# Patient Record
Sex: Female | Born: 1950
Health system: Southern US, Community
[De-identification: ages and names within clinical notes are randomized; demographics above are authoritative.]

## PROBLEM LIST (undated history)

## (undated) DIAGNOSIS — L409 Psoriasis, unspecified: Secondary | ICD-10-CM

## (undated) DIAGNOSIS — N201 Calculus of ureter: Secondary | ICD-10-CM

## (undated) DIAGNOSIS — N200 Calculus of kidney: Secondary | ICD-10-CM

## (undated) DIAGNOSIS — G4730 Sleep apnea, unspecified: Secondary | ICD-10-CM

## (undated) DIAGNOSIS — M1990 Unspecified osteoarthritis, unspecified site: Secondary | ICD-10-CM

## (undated) HISTORY — PX: TONSILLECTOMY: SUR1361

---

## 1992-08-27 HISTORY — PX: ABDOMINAL HYSTERECTOMY: SHX81

## 1999-02-03 ENCOUNTER — Other Ambulatory Visit: Admission: RE | Admit: 1999-02-03 | Discharge: 1999-02-03 | Payer: Self-pay | Admitting: Family Medicine

## 2001-02-13 ENCOUNTER — Other Ambulatory Visit: Admission: RE | Admit: 2001-02-13 | Discharge: 2001-02-13 | Payer: Self-pay | Admitting: Family Medicine

## 2003-02-05 ENCOUNTER — Other Ambulatory Visit: Admission: RE | Admit: 2003-02-05 | Discharge: 2003-02-05 | Payer: Self-pay | Admitting: Family Medicine

## 2012-12-07 ENCOUNTER — Emergency Department: Payer: Self-pay | Admitting: Emergency Medicine

## 2012-12-07 LAB — COMPREHENSIVE METABOLIC PANEL
Albumin: 3.6 g/dL (ref 3.4–5.0)
Alkaline Phosphatase: 113 U/L (ref 50–136)
Bilirubin,Total: 0.3 mg/dL (ref 0.2–1.0)
EGFR (African American): >60
EGFR (Non-African Amer.): >60
Glucose: 122 mg/dL — ABNORMAL HIGH (ref 65–99)
Osmolality: 282 (ref 275–301)
SGPT (ALT): 19 U/L (ref 12–78)
Sodium: 140 mmol/L (ref 136–145)
Total Protein: 7.5 g/dL (ref 6.4–8.2)

## 2012-12-07 LAB — URINALYSIS, COMPLETE
Nitrite: NEGATIVE
Ph: 5 (ref 4.5–8.0)
Protein: NEGATIVE
RBC,UR: 8 /HPF (ref 0–5)
Specific Gravity: 1.027 (ref 1.003–1.030)
WBC UR: 3 /HPF (ref 0–5)

## 2012-12-07 LAB — CBC
HCT: 39.4 % (ref 35.0–47.0)
MCH: 31.3 pg (ref 26.0–34.0)
MCHC: 33 g/dL (ref 32.0–36.0)
RDW: 14.2 % (ref 11.5–14.5)

## 2012-12-15 ENCOUNTER — Ambulatory Visit: Payer: Self-pay

## 2012-12-16 ENCOUNTER — Other Ambulatory Visit: Payer: Self-pay | Admitting: Urology

## 2012-12-23 ENCOUNTER — Encounter (HOSPITAL_BASED_OUTPATIENT_CLINIC_OR_DEPARTMENT_OTHER): Payer: Self-pay | Admitting: *Deleted

## 2012-12-24 ENCOUNTER — Encounter (HOSPITAL_BASED_OUTPATIENT_CLINIC_OR_DEPARTMENT_OTHER): Payer: Self-pay | Admitting: *Deleted

## 2012-12-24 NOTE — Progress Notes (Addendum)
NPO AFTER MN WITH EXCEPTION WATER UNTIL 0600.  ARRIVES AT 1115. NEEDS ISTAT 8 (GENT. ORDERED). MAY TAKE RX PAIN/ NAUSEA/ MEDS IF NEEDED W/ SIPS OF WATER.

## 2012-12-25 ENCOUNTER — Encounter (HOSPITAL_BASED_OUTPATIENT_CLINIC_OR_DEPARTMENT_OTHER): Payer: Self-pay | Admitting: *Deleted

## 2012-12-25 ENCOUNTER — Encounter (HOSPITAL_BASED_OUTPATIENT_CLINIC_OR_DEPARTMENT_OTHER): Payer: Self-pay | Admitting: Anesthesiology

## 2012-12-25 ENCOUNTER — Encounter (HOSPITAL_BASED_OUTPATIENT_CLINIC_OR_DEPARTMENT_OTHER)
Admission: RE | Disposition: A | Discharge status: Home / Self Care | Payer: Self-pay | Source: Ambulatory Visit | Attending: Urology

## 2012-12-25 ENCOUNTER — Ambulatory Visit (HOSPITAL_BASED_OUTPATIENT_CLINIC_OR_DEPARTMENT_OTHER): Payer: BC Managed Care – PPO | Admitting: Anesthesiology

## 2012-12-25 ENCOUNTER — Ambulatory Visit (HOSPITAL_BASED_OUTPATIENT_CLINIC_OR_DEPARTMENT_OTHER)
Admission: RE | Admit: 2012-12-25 | Discharge: 2012-12-25 | Disposition: A | Discharge status: Home / Self Care | Payer: BC Managed Care – PPO | Source: Ambulatory Visit | Attending: Urology | Admitting: Urology

## 2012-12-25 DIAGNOSIS — Z888 Allergy status to other drugs, medicaments and biological substances status: Secondary | ICD-10-CM | POA: Insufficient documentation

## 2012-12-25 DIAGNOSIS — E669 Obesity, unspecified: Secondary | ICD-10-CM | POA: Insufficient documentation

## 2012-12-25 DIAGNOSIS — M129 Arthropathy, unspecified: Secondary | ICD-10-CM | POA: Insufficient documentation

## 2012-12-25 DIAGNOSIS — N201 Calculus of ureter: Secondary | ICD-10-CM | POA: Insufficient documentation

## 2012-12-25 DIAGNOSIS — N200 Calculus of kidney: Secondary | ICD-10-CM | POA: Insufficient documentation

## 2012-12-25 DIAGNOSIS — L408 Other psoriasis: Secondary | ICD-10-CM | POA: Insufficient documentation

## 2012-12-25 HISTORY — PX: CYSTOSCOPY WITH RETROGRADE PYELOGRAM, URETEROSCOPY AND STENT PLACEMENT: SHX5789

## 2012-12-25 HISTORY — DX: Calculus of kidney: N20.0

## 2012-12-25 HISTORY — DX: Calculus of ureter: N20.1

## 2012-12-25 HISTORY — PX: HOLMIUM LASER APPLICATION: SHX5852

## 2012-12-25 HISTORY — DX: Psoriasis, unspecified: L40.9

## 2012-12-25 HISTORY — DX: Unspecified osteoarthritis, unspecified site: M19.90

## 2012-12-25 LAB — POCT I-STAT, CHEM 8
Calcium, Ion: 1.25 mmol/L (ref 1.13–1.30)
Glucose, Bld: 83 mg/dL (ref 70–99)
HCT: 39 % (ref 36.0–46.0)
TCO2: 27 mmol/L (ref 0–100)

## 2012-12-25 SURGERY — CYSTOURETEROSCOPY, WITH RETROGRADE PYELOGRAM AND STENT INSERTION
Anesthesia: General | Site: Ureter | Laterality: Left | Wound class: Clean Contaminated

## 2012-12-25 MED ORDER — LIDOCAINE HCL (CARDIAC) 20 MG/ML IV SOLN
INTRAVENOUS | Status: DC | PRN
  Administered 2012-12-25: 75 mg via INTRAVENOUS

## 2012-12-25 MED ORDER — LACTATED RINGERS IV SOLN
INTRAVENOUS | Status: DC
  Administered 2012-12-25 (×2): via INTRAVENOUS
  Filled 2012-12-25: qty 1000

## 2012-12-25 MED ORDER — FENTANYL CITRATE 0.05 MG/ML IJ SOLN
INTRAMUSCULAR | Status: DC | PRN
  Administered 2012-12-25 (×3): 25 ug via INTRAVENOUS
  Administered 2012-12-25: 50 ug via INTRAVENOUS
  Administered 2012-12-25: 25 ug via INTRAVENOUS

## 2012-12-25 MED ORDER — DEXAMETHASONE SODIUM PHOSPHATE 4 MG/ML IJ SOLN
INTRAMUSCULAR | Status: DC | PRN
  Administered 2012-12-25: 8 mg via INTRAVENOUS

## 2012-12-25 MED ORDER — SODIUM CHLORIDE 0.9 % IR SOLN
Status: DC | PRN
  Administered 2012-12-25: 6000 mL via INTRAVESICAL

## 2012-12-25 MED ORDER — PROPOFOL 10 MG/ML IV BOLUS
INTRAVENOUS | Status: DC | PRN
  Administered 2012-12-25: 300 mg via INTRAVENOUS

## 2012-12-25 MED ORDER — GENTAMICIN IN SALINE 1.6-0.9 MG/ML-% IV SOLN
80.0000 mg | INTRAVENOUS | Status: AC
  Administered 2012-12-25: 560 mg via INTRAVENOUS
  Filled 2012-12-25: qty 50

## 2012-12-25 MED ORDER — PROMETHAZINE HCL 25 MG/ML IJ SOLN
6.2500 mg | INTRAMUSCULAR | Status: DC | PRN
  Filled 2012-12-25: qty 1

## 2012-12-25 MED ORDER — IOHEXOL 350 MG/ML SOLN
INTRAVENOUS | Status: DC | PRN
  Administered 2012-12-25: 15 mL via URETHRAL

## 2012-12-25 MED ORDER — FENTANYL CITRATE 0.05 MG/ML IJ SOLN
25.0000 ug | INTRAMUSCULAR | Status: DC | PRN
  Filled 2012-12-25: qty 1

## 2012-12-25 SURGICAL SUPPLY — 40 items
ADAPTER CATH URET PLST 4-6FR (CATHETERS) IMPLANT
BAG DRAIN URO-CYSTO SKYTR STRL (DRAIN) ×2 IMPLANT
BAG URO CATCHER STRL LF (DRAPE) ×2 IMPLANT
BASKET LASER NITINOL 1.9FR (BASKET) ×2 IMPLANT
BASKET STNLS GEMINI 4WIRE 3FR (BASKET) IMPLANT
BASKET ZERO TIP NITINOL 2.4FR (BASKET) IMPLANT
BRUSH URET BIOPSY 3F (UROLOGICAL SUPPLIES) IMPLANT
CANISTER SUCT LVC 12 LTR MEDI- (MISCELLANEOUS) ×2 IMPLANT
CATH FOLEY 2WAY  3CC  8FR (CATHETERS)
CATH FOLEY 2WAY 3CC 8FR (CATHETERS) IMPLANT
CATH INTERMIT  6FR 70CM (CATHETERS) IMPLANT
CATH URET 5FR 28IN CONE TIP (BALLOONS)
CATH URET 5FR 28IN OPEN ENDED (CATHETERS) ×2 IMPLANT
CATH URET 5FR 70CM CONE TIP (BALLOONS) IMPLANT
CLOTH BEACON ORANGE TIMEOUT ST (SAFETY) ×2 IMPLANT
DRAPE CAMERA CLOSED 9X96 (DRAPES) ×2 IMPLANT
ELECT REM PT RETURN 9FT ADLT (ELECTROSURGICAL)
ELECTRODE REM PT RTRN 9FT ADLT (ELECTROSURGICAL) IMPLANT
GLOVE BIO SURGEON STRL SZ7 (GLOVE) ×2 IMPLANT
GLOVE BIO SURGEON STRL SZ7.5 (GLOVE) ×2 IMPLANT
GLOVE INDICATOR 7.0 STRL GRN (GLOVE) ×2 IMPLANT
GOWN PREVENTION PLUS LG XLONG (DISPOSABLE) ×4 IMPLANT
GOWN STRL REIN XL XLG (GOWN DISPOSABLE) ×2 IMPLANT
GUIDEWIRE 0.038 PTFE COATED (WIRE) IMPLANT
GUIDEWIRE ANG ZIPWIRE 038X150 (WIRE) ×2 IMPLANT
GUIDEWIRE STR DUAL SENSOR (WIRE) ×2 IMPLANT
IV NS IRRIG 3000ML ARTHROMATIC (IV SOLUTION) ×4 IMPLANT
KIT BALLIN UROMAX 15FX10 (LABEL) IMPLANT
KIT BALLN UROMAX 15FX4 (MISCELLANEOUS) IMPLANT
KIT BALLN UROMAX 26 75X4 (MISCELLANEOUS)
LASER FIBER DISP (UROLOGICAL SUPPLIES) ×2 IMPLANT
PACK CYSTOSCOPY (CUSTOM PROCEDURE TRAY) ×2 IMPLANT
SET HIGH PRES BAL DIL (LABEL)
SHEATH URET ACCESS 12FR/35CM (UROLOGICAL SUPPLIES) IMPLANT
SHEATH URET ACCESS 12FR/55CM (UROLOGICAL SUPPLIES) IMPLANT
STENT CONTOUR 6FRX24X.038 (STENTS) ×2 IMPLANT
STENT CONTOUR 6FRX26X.035 (STENTS) ×2 IMPLANT
SYRINGE 10CC LL (SYRINGE) ×2 IMPLANT
SYRINGE IRR TOOMEY STRL 70CC (SYRINGE) IMPLANT
TUBE FEEDING 8FR 16IN STR KANG (MISCELLANEOUS) IMPLANT

## 2012-12-25 NOTE — Anesthesia Procedure Notes (Signed)
Procedure Name: LMA Insertion Date/Time: 12/25/2012 12:46 PM Performed by: Fran Lowes Pre-anesthesia Checklist: Patient identified, Emergency Drugs available, Suction available and Patient being monitored Patient Re-evaluated:Patient Re-evaluated prior to inductionOxygen Delivery Method: Circle System Utilized Preoxygenation: Pre-oxygenation with 100% oxygen Intubation Type: IV induction Ventilation: Mask ventilation without difficulty LMA: LMA inserted LMA Size: 4.0 Number of attempts: 1 Airway Equipment and Method: bite block Placement Confirmation: positive ETCO2 Tube secured with: Tape Dental Injury: Teeth and Oropharynx as per pre-operative assessment

## 2012-12-25 NOTE — Brief Op Note (Signed)
12/25/2012  1:27 PM  PATIENT:  Cornelia Copa  62 y.o. female  PRE-OPERATIVE DIAGNOSIS:  LEFT RENAL AND URETERAL STONES  POST-OPERATIVE DIAGNOSIS:  LEFT RENAL AND URETERAL STONES  PROCEDURE:  Procedure(s) with comments: CYSTOSCOPY WITH RETROGRADE PYELOGRAM, URETEROSCOPY AND STENT PLACEMENT (Left) - 90 MIN NEEDS DIG URETEROSCOPE  HOLMIUM LASER APPLICATION (Left)  SURGEON:  Surgeon(s) and Role:    * Sebastian Ache, MD - Primary  PHYSICIAN ASSISTANT:   ASSISTANTS: none   ANESTHESIA:   general  EBL:  Total I/O In: 200 [I.V.:200] Out: -   BLOOD ADMINISTERED:none  DRAINS: none   LOCAL MEDICATIONS USED:  NONE  SPECIMEN:  Source of Specimen:  Left Ureteral Stone  DISPOSITION OF SPECIMEN:  [facility] for Compositional Analysis  COUNTS:  YES  TOURNIQUET:  * No tourniquets in log *  DICTATION: .Other Dictation: Dictation Number [id number]  PLAN OF CARE: Discharge to home after PACU  PATIENT DISPOSITION:  PACU - hemodynamically stable.   Delay start of Pharmacological VTE agent (>24hrs) due to surgical blood loss or risk of bleeding: no

## 2012-12-25 NOTE — Anesthesia Preprocedure Evaluation (Signed)
Anesthesia Evaluation  Patient identified by MRN, date of birth, ID band Patient awake  General Assessment Comment:  Left ureteral calculus     .  Renal calculus, left     .  Psoriasis  SEVERE--  RECEIVES LASE TX'S TWICE WEEKLY   .  Arthritis     Reviewed: Allergy & Precautions, H&P , NPO status , Patient's Chart, lab work & pertinent test results  Airway Mallampati: II TM Distance: >3 FB Neck ROM: Full    Dental no notable dental hx.    Pulmonary neg pulmonary ROS,  breath sounds clear to auscultation  Pulmonary exam normal       Cardiovascular negative cardio ROS  Rhythm:Regular Rate:Normal     Neuro/Psych negative neurological ROS  negative psych ROS   GI/Hepatic negative GI ROS, Neg liver ROS,   Endo/Other  negative endocrine ROSMorbid obesity  Renal/GU Renal disease  negative genitourinary   Musculoskeletal negative musculoskeletal ROS (+)   Abdominal (+) + obese,   Peds negative pediatric ROS (+)  Hematology negative hematology ROS (+)   Anesthesia Other Findings   Reproductive/Obstetrics negative OB ROS                           Anesthesia Physical Anesthesia Plan  ASA: III  Anesthesia Plan: General   Post-op Pain Management:    Induction: Intravenous  Airway Management Planned: LMA  Additional Equipment:   Intra-op Plan:   Post-operative Plan: Extubation in OR  Informed Consent: I have reviewed the patients History and Physical, chart, labs and discussed the procedure including the risks, benefits and alternatives for the proposed anesthesia with the patient or authorized representative who has indicated his/her understanding and acceptance.   Dental advisory given  Plan Discussed with: CRNA  Anesthesia Plan Comments:         Anesthesia Quick Evaluation

## 2012-12-25 NOTE — Transfer of Care (Signed)
Immediate Anesthesia Transfer of Care Note  Patient: Morgan Mcclain  Procedure(s) Performed: Procedure(s) (LRB): CYSTOSCOPY WITH RETROGRADE PYELOGRAM, URETEROSCOPY AND STENT PLACEMENT (Left) HOLMIUM LASER APPLICATION (Left)  Patient Location: Patient transported to PACU with oxygen via face mask at 4 Liters / Min  Anesthesia Type: General  Level of Consciousness: awake and alert   Airway & Oxygen Therapy: Patient Spontanous Breathing and Patient connected to face mask oxygen  Post-op Assessment: Report given to PACU RN and Post -op Vital signs reviewed and stable  Post vital signs: Reviewed and stable  Dentition: Teeth and oropharynx remain in pre-op condition  Complications: No apparent anesthesia complications

## 2012-12-25 NOTE — H&P (Signed)
Morgan Mcclain is an 62 y.o. female.    Chief Complaint: Pre-OP Left Ureteroscopic Stone Manipulation  HPI:   1 - Left Ureteral Stone  - Pt with Left 6mm UVJ sonte and ipsilateral 2mm renal stone found on CT at [facility] ER 11/2012 on w/u colickly flank pain. NO fevers. No prior stones. No interval passage.  PMH sig for benign hyst, obesity, psorriasis. NO CV disease. No blood thinners.   Past Medical History  Diagnosis Date  . Left ureteral calculus   . Renal calculus, left   . Psoriasis SEVERE--  RECEIVES LASE TX'S TWICE WEEKLY  . Arthritis     Past Surgical History  Procedure Laterality Date  . Abdominal hysterectomy  1994    W/ UNILATERAL SALPINGOOPHORECTOMY  . Tonsillectomy  AGE 71    History reviewed. No pertinent family history. Social History:  reports that she has never smoked. She has never used smokeless tobacco. She reports that she does not drink alcohol or use illicit drugs.  Allergies:  Allergies  Allergen Reactions  . Lamisil (Terbinafine Hcl) Other (See Comments)    LOSS SENSE OF TASTE    No prescriptions prior to admission    No results found for this or any previous visit (from the past 48 hour(s)). No results found.  Review of Systems  Constitutional: Negative.  Negative for fever and chills.  HENT: Negative.   Eyes: Negative.   Respiratory: Negative.   Cardiovascular: Negative.   Gastrointestinal: Negative.   Genitourinary: Positive for flank pain. Negative for dysuria and urgency.  Musculoskeletal: Negative.   Skin: Negative.   Neurological: Negative.   Endo/Heme/Allergies: Negative.   Psychiatric/Behavioral: Negative.     Height 5\' 5"  (1.651 m), weight 111.131 kg (245 lb). Physical Exam  Constitutional: She is oriented to person, place, and time. She appears well-developed and well-nourished.  HENT:  Head: Normocephalic and atraumatic.  Eyes: EOM are normal. Pupils are equal, round, and reactive to light.  Neck: Normal range  of motion. Neck supple.  Cardiovascular: Normal rate.   Respiratory: Effort normal.  GI: Soft. Bowel sounds are normal.  Genitourinary:  Moderate Left CVAT  Musculoskeletal: Normal range of motion.  Neurological: She is alert and oriented to person, place, and time.  Skin: Skin is warm and dry.  Psychiatric: She has a normal mood and affect. Her behavior is normal. Judgment and thought content normal.     Assessment/Plan 1 - Left Ureteral Stone  - We discussed ureteroscopic stone manipulation with basketing and laser-lithotripsy in detail.  We discussed risks including bleeding, infection, damage to kidney / ureter  bladder, rarely loss of kidney. We discussed anesthetic risks and rare but serious surgical complications including DVT, PE, MI, and mortality. We specifically addressed that in 5-10% of cases a staged approach is required with stenting followed by re-attempt ureteroscopy if anatomy unfavorable. The patient voiced understanding and wises to proceed.   Cadell Gabrielson 12/25/2012, 9:42 AM

## 2012-12-26 ENCOUNTER — Encounter (HOSPITAL_BASED_OUTPATIENT_CLINIC_OR_DEPARTMENT_OTHER): Payer: Self-pay | Admitting: Urology

## 2012-12-26 NOTE — Anesthesia Postprocedure Evaluation (Signed)
  Anesthesia Post-op Note  Patient: Morgan Mcclain  Procedure(s) Performed: Procedure(s) (LRB): CYSTOSCOPY WITH RETROGRADE PYELOGRAM, URETEROSCOPY AND STENT PLACEMENT (Left) HOLMIUM LASER APPLICATION (Left)  Patient Location: PACU  Anesthesia Type: General  Level of Consciousness: awake and alert   Airway and Oxygen Therapy: Patient Spontanous Breathing  Post-op Pain: mild  Post-op Assessment: Post-op Vital signs reviewed, Patient's Cardiovascular Status Stable, Respiratory Function Stable, Patent Airway and No signs of Nausea or vomiting  Last Vitals:  Filed Vitals:   12/25/12 1600  BP: 136/82  Pulse: 68  Temp: 36.1 C  Resp: 18    Post-op Vital Signs: stable   Complications: No apparent anesthesia complications

## 2012-12-26 NOTE — Op Note (Signed)
NAMETERRION, Morgan Mcclain NO.:  [id number]  MEDICAL RECORD NO.:  [id number]  LOCATION:                                 FACILITY:  PHYSICIAN:  Sebastian Ache, MD          DATE OF BIRTH:  DATE OF PROCEDURE:  12/25/2012 DATE OF DISCHARGE:                              OPERATIVE REPORT   DIAGNOSIS:  Left ureteral stone, flank pain.  PROCEDURES: 1. Left ureteroscopic laser lithotripsy with removal of ureteral     stone. 2. Left retrograde pyelogram with interpretation. 3. Left ureteral stent placement, 6 x 26, with tether to the left     side.  COMPLICATIONS:  None.  SPECIMEN:  Left ureteral stone for compositional analysis.  ESTIMATED BLOOD LOSS:  Nil.  DRAINS:  None.  INDICATION:  Morgan Mcclain is a pleasant 62 year old lady with recent onset of left flank pain worrisome for possible ureteral stone.  She underwent evaluation in the [facility] Emergency Room where she was found to have a distal left ureteral stone versus phlebolith.  I saw the patient, reviewed the films and felt this was highly concerning for distal left ureteral stone.  Options were discussed including observation versus medical therapy versus shockwave lithotripsy versus ureteroscopic stone manipulation and she wished to proceed with the latter.  Informed consent was obtained and placed in the medical record.  PROCEDURE IN DETAIL:  The patient was being Morgan Mcclain, was verified.  Procedure being left ureteroscopic stone manipulation was confirmed.  Procedure was carried out.  Time-out was performed. Intravenous antibiotics were administered.  General LMA anesthesia was introduced.  The patient was placed into a low lithotomy position and sterile field was created by prepping and draping the patient's vagina, introitus, and proximal thighs using iodine x3.  Next, cystourethroscopy was performed using 22-French rigid cystoscope with 12-degree offset lens.  Inspection of the urinary  bladder revealed no diverticula, calcifications, papular lesions.  Ureteral orifices were in the normal anatomic position.  The left ureteral orifice was gently cannulated using a 5-French end-hole catheter and left retrograde pyelogram was seen.  Left retrograde pyelogram demonstrated a single left ureter, single system left kidney.  There was single filling defect in the distal ureter consistent with known stone.  There was no hydroureteronephrosis. A 0.038 Glidewire was advanced at the level of the upper pole and set aside as a safety wire.  Next, semi-rigid ureteroscopy was performed of the distal two-thirds of the ureter alongside a separate Sensor working wire and 8-French feeding tube in the urinary bladder for pressure release using normal saline irrigation under pressure.  This corroborated distal left ureteral stone, suspected no abnormalities in the distal two-thirds of the ureter other than the stone.  The stone appeared to be too large for simple basketing as such holmium laser energy was applied to the stone using settings of 0.5 joules and 5 hertz fragmenting into three smaller pieces.  These were then grasped with an escape-type basket and brought out in their entirety and set aside for compositional analysis.  Repeat ureteroscopy of the distal two-thirds revealed no mucosal abnormalities, no residual stone fragments.  There was a question of a  concomitant punctate left renal stone.  It was felt that inspection of the intrarenal collecting system was warranted as such the semi-rigid ureteroscope was exchanged over the sensor working wire for a 6-French flexible ureteroscope to the level of the kidney under continuous fluoroscopy, panendoscopy.  The left kidney revealed no mucosal abnormalities and no evidence of perforation or urolithiasis. The entire left ureter was once again inspected with withdrawing of the scope and no mucosal abnormalities were found.  Finally, a  new 6 x 24 double-J stent was carefully navigated up to the remaining safety wire and this appeared to be too short as such it was exchanged for a 6 x 26 stent on the left side, which allowed excellent proximal and distal curl.  Efflux of the urine was noted around and through the distal end of the stent, tether was left in place and fashioned to the thigh. Bladder was emptied per cystoscope.  Procedure was then terminated.  The patient tolerated the procedure well.  There were no immediate periprocedural complications.  The patient was taken to postanesthesia care unit in stable condition.          ______________________________ Sebastian Ache, MD     TM/MEDQ  D:  12/25/2012  T:  12/26/2012  Job:  [id number]

## 2013-08-24 ENCOUNTER — Ambulatory Visit: Payer: Self-pay | Admitting: Family Medicine

## 2013-08-26 ENCOUNTER — Ambulatory Visit: Payer: Self-pay | Admitting: Family Medicine

## 2015-01-07 ENCOUNTER — Ambulatory Visit: Payer: BLUE CROSS/BLUE SHIELD | Attending: Otolaryngology

## 2015-01-07 DIAGNOSIS — E669 Obesity, unspecified: Secondary | ICD-10-CM | POA: Diagnosis not present

## 2015-01-07 DIAGNOSIS — G4733 Obstructive sleep apnea (adult) (pediatric): Secondary | ICD-10-CM | POA: Diagnosis present

## 2015-01-07 DIAGNOSIS — R0683 Snoring: Secondary | ICD-10-CM | POA: Diagnosis not present

## 2015-01-11 ENCOUNTER — Other Ambulatory Visit: Payer: Self-pay | Admitting: Family Medicine

## 2015-01-11 DIAGNOSIS — Z1231 Encounter for screening mammogram for malignant neoplasm of breast: Secondary | ICD-10-CM

## 2015-01-21 ENCOUNTER — Ambulatory Visit
Admission: RE | Admit: 2015-01-21 | Discharge: 2015-01-21 | Disposition: A | Discharge status: Home / Self Care | Payer: BLUE CROSS/BLUE SHIELD | Source: Ambulatory Visit | Attending: Family Medicine | Admitting: Family Medicine

## 2015-01-21 DIAGNOSIS — Z1231 Encounter for screening mammogram for malignant neoplasm of breast: Secondary | ICD-10-CM

## 2015-02-03 ENCOUNTER — Telehealth: Payer: Self-pay

## 2015-02-03 DIAGNOSIS — Z1211 Encounter for screening for malignant neoplasm of colon: Secondary | ICD-10-CM

## 2015-02-03 NOTE — Telephone Encounter (Signed)
-----   Message from Myrtis Hopping sent at 01/31/2015 11:19 AM EDT ----- Regarding: triage From: [place] Planas Sent: 01/13/2015  please contact patient for colonoscopy screening

## 2015-02-03 NOTE — Telephone Encounter (Signed)
LVM for pt to return my call.

## 2015-02-09 ENCOUNTER — Other Ambulatory Visit: Payer: Self-pay

## 2015-02-09 ENCOUNTER — Telehealth: Payer: Self-pay

## 2015-02-09 NOTE — Telephone Encounter (Signed)
Pt scheduled for colonoscopy at [facility] on July 8th. Instructs/rx mailed.

## 2015-02-09 NOTE — Telephone Encounter (Signed)
Gastroenterology Pre-Procedure Review  Request Date: 03-04-15 Requesting Physician: Dr. Burnell Blanks  PATIENT REVIEW QUESTIONS: The patient responded to the following health history questions as indicated:    1. Are you having any GI issues? no 2. Do you have a personal history of Polyps? no 3. Do you have a family history of Colon Cancer or Polyps? no 4. Diabetes Mellitus? no 5. Joint replacements in the past 12 months?no 6. Major health problems in the past 3 months?no 7. Any artificial heart valves, MVP, or defibrillator?no    MEDICATIONS & ALLERGIES:    Patient reports the following regarding taking any anticoagulation/antiplatelet therapy:   Plavix, Coumadin, Eliquis, Xarelto, Lovenox, Pradaxa, Brilinta, or Effient? no Aspirin? no  Patient confirms/reports the following medications:  Current Outpatient Prescriptions  Medication Sig Dispense Refill  . Apremilast (OTEZLA) 10 & 20 & 30 MG TBPK Take 20 mg by mouth 2 (two) times daily.     No current facility-administered medications for this visit.    Patient confirms/reports the following allergies:  Allergies  Allergen Reactions  . Lamisil [Terbinafine Hcl] Other (See Comments)    LOSS SENSE OF TASTE    No orders of the defined types were placed in this encounter.    AUTHORIZATION INFORMATION Primary Insurance: 1D#: Group #:  Secondary Insurance: 1D#: Group #:  SCHEDULE INFORMATION: Date: 03-04-15 Time: Location: [facility]

## 2015-02-09 NOTE — Telephone Encounter (Signed)
-----   Message from Samar R Ellison sent at 01/31/2015 11:19 AM EDT ----- Regarding: triage From: Alexander, Ami Sent: 01/13/2015  please contact patient for colonoscopy screening 

## 2015-02-23 ENCOUNTER — Encounter: Payer: Self-pay | Admitting: *Deleted

## 2015-02-23 DIAGNOSIS — G4730 Sleep apnea, unspecified: Secondary | ICD-10-CM | POA: Insufficient documentation

## 2015-02-23 HISTORY — DX: Sleep apnea, unspecified: G47.30

## 2015-03-03 NOTE — Discharge Instructions (Signed)

## 2015-03-04 ENCOUNTER — Ambulatory Visit: Payer: BC Managed Care – PPO | Admitting: Anesthesiology

## 2015-03-04 ENCOUNTER — Ambulatory Visit
Admission: RE | Admit: 2015-03-04 | Discharge: 2015-03-04 | Disposition: A | Discharge status: Home / Self Care | Payer: BC Managed Care – PPO | Source: Ambulatory Visit | Attending: Gastroenterology | Admitting: Gastroenterology

## 2015-03-04 ENCOUNTER — Encounter
Admission: RE | Disposition: A | Discharge status: Home / Self Care | Payer: Self-pay | Source: Ambulatory Visit | Attending: Gastroenterology

## 2015-03-04 DIAGNOSIS — Z87442 Personal history of urinary calculi: Secondary | ICD-10-CM | POA: Diagnosis not present

## 2015-03-04 DIAGNOSIS — M1990 Unspecified osteoarthritis, unspecified site: Secondary | ICD-10-CM | POA: Insufficient documentation

## 2015-03-04 DIAGNOSIS — Z888 Allergy status to other drugs, medicaments and biological substances status: Secondary | ICD-10-CM | POA: Insufficient documentation

## 2015-03-04 DIAGNOSIS — L409 Psoriasis, unspecified: Secondary | ICD-10-CM | POA: Diagnosis not present

## 2015-03-04 DIAGNOSIS — Z9889 Other specified postprocedural states: Secondary | ICD-10-CM | POA: Diagnosis not present

## 2015-03-04 DIAGNOSIS — G4730 Sleep apnea, unspecified: Secondary | ICD-10-CM | POA: Diagnosis not present

## 2015-03-04 DIAGNOSIS — Z79899 Other long term (current) drug therapy: Secondary | ICD-10-CM | POA: Insufficient documentation

## 2015-03-04 DIAGNOSIS — Z1211 Encounter for screening for malignant neoplasm of colon: Secondary | ICD-10-CM | POA: Insufficient documentation

## 2015-03-04 DIAGNOSIS — Z90710 Acquired absence of both cervix and uterus: Secondary | ICD-10-CM | POA: Insufficient documentation

## 2015-03-04 HISTORY — DX: Sleep apnea, unspecified: G47.30

## 2015-03-04 HISTORY — PX: COLONOSCOPY WITH PROPOFOL: SHX5780

## 2015-03-04 SURGERY — COLONOSCOPY WITH PROPOFOL
Anesthesia: Monitor Anesthesia Care | Wound class: Contaminated

## 2015-03-04 MED ORDER — LIDOCAINE HCL (CARDIAC) 20 MG/ML IV SOLN
INTRAVENOUS | Status: DC | PRN
  Administered 2015-03-04: 50 mg via INTRAVENOUS

## 2015-03-04 MED ORDER — LACTATED RINGERS IV SOLN
INTRAVENOUS | Status: DC
  Administered 2015-03-04: 10:00:00 via INTRAVENOUS

## 2015-03-04 MED ORDER — ACETAMINOPHEN 160 MG/5ML PO SOLN: 325.0000 mg | ORAL | Status: DC | PRN

## 2015-03-04 MED ORDER — SODIUM CHLORIDE 0.9 % IV SOLN: INTRAVENOUS | Status: DC

## 2015-03-04 MED ORDER — STERILE WATER FOR IRRIGATION IR SOLN
Status: DC | PRN
  Administered 2015-03-04: 11:00:00

## 2015-03-04 MED ORDER — ACETAMINOPHEN 325 MG PO TABS: 325.0000 mg | ORAL_TABLET | ORAL | Status: DC | PRN

## 2015-03-04 MED ORDER — PROPOFOL 10 MG/ML IV BOLUS
INTRAVENOUS | Status: DC | PRN
  Administered 2015-03-04: 50 mg via INTRAVENOUS
  Administered 2015-03-04 (×3): 20 mg via INTRAVENOUS
  Administered 2015-03-04: 10 mg via INTRAVENOUS
  Administered 2015-03-04: 30 mg via INTRAVENOUS
  Administered 2015-03-04: 20 mg via INTRAVENOUS

## 2015-03-04 SURGICAL SUPPLY — 28 items

## 2015-03-04 NOTE — Op Note (Signed)
[facility] Gastroenterology Patient Name: Morgan Mcclain Procedure Date: 03/04/2015 10:35 AM MRN: [id number] Account #: [id number] Date of Birth: 03-09-1951 Admit Type: Outpatient Age: [age] [facility] Gender: Female Note Status: Finalized Procedure:         Colonoscopy Indications:       Screening for colorectal malignant neoplasm Providers:         Midge Miniumarren Carolie Mcilrath, MD Referring MD:      Durward FortesMaura L. Hamrick, MD (Referring MD) Medicines:         Propofol per Anesthesia Complications:     No immediate complications. Procedure:         Pre-Anesthesia Assessment:                    - Prior to the procedure, a History and Physical was                     performed, and patient medications and allergies were                     reviewed. The patient's tolerance of previous anesthesia                     was also reviewed. The risks and benefits of the procedure                     and the sedation options and risks were discussed with the                     patient. All questions were answered, and informed consent                     was obtained. Prior Anticoagulants: The patient has taken                     no previous anticoagulant or antiplatelet agents. ASA                     Grade Assessment: II - A patient with mild systemic                     disease. After reviewing the risks and benefits, the                     patient was deemed in satisfactory condition to undergo                     the procedure.                    After obtaining informed consent, the colonoscope was                     passed under direct vision. Throughout the procedure, the                     patient's blood pressure, pulse, and oxygen saturations                     were monitored continuously. The was introduced through                     the anus and advanced to the the cecum, identified by  appendiceal orifice and ileocecal valve. The colonoscopy                 was performed without difficulty. The patient tolerated                     the procedure well. The quality of the bowel preparation                     was excellent. Findings:      The perianal and digital rectal examinations were normal.      The colon (entire examined portion) appeared normal. Impression:        - The entire examined colon is normal.                    - No specimens collected. Recommendation:    - Repeat colonoscopy in 10 years for screening unless any                     change in family history or lower GI problems. Procedure Code(s): --- Professional ---                    [phone], Colonoscopy, flexible; diagnostic, including                     collection of specimen(s) by brushing or washing, when                     performed (separate procedure) Diagnosis Code(s): --- Professional ---                    Z12.11, Encounter for screening for malignant neoplasm of                     colon CPT copyright 2014 American Medical Association. All rights reserved. The codes documented in this report are preliminary and upon coder review may  be revised to meet current compliance requirements. Midge Minium, MD 03/04/2015 11:05:15 AM This report has been signed electronically. Number of Addenda: 0 Note Initiated On: 03/04/2015 10:35 AM Scope Withdrawal Time: 0 hours 6 minutes 34 seconds  Total Procedure Duration: 0 hours 10 minutes 34 seconds       [facility]

## 2015-03-04 NOTE — H&P (Signed)
  [facility]  [address] [place], [place] [address] Phone: [phone] Fax : [phone]  Primary Care Physician:  Ailene RavelHAMRICK,MAURA L, MD Primary Gastroenterologist:  Dr. Servando SnareWohl  Pre-Procedure History & Physical: HPI:  Morgan Copaatricia D Kishimoto is a 64 y.o. female is here for a screening colonoscopy.   Past Medical History  Diagnosis Date  . Left ureteral calculus   . Renal calculus, left   . Psoriasis SEVERE--  RECEIVES LASE TX'S TWICE WEEKLY  . Arthritis   . Sleep apnea 02/23/2015    got CPAP machine today    Past Surgical History  Procedure Laterality Date  . Abdominal hysterectomy  1994    W/ UNILATERAL SALPINGOOPHORECTOMY  . Tonsillectomy  AGE 64  . Cystoscopy with retrograde pyelogram, ureteroscopy and stent placement Left 12/25/2012    Procedure: CYSTOSCOPY WITH RETROGRADE PYELOGRAM, URETEROSCOPY AND STENT PLACEMENT;  Surgeon: Sebastian Acheheodore Manny, MD;  [facility];  Service: Urology;  Laterality: Left;  90 MIN NEEDS DIG URETEROSCOPE   . Holmium laser application Left 12/25/2012    Procedure: HOLMIUM LASER APPLICATION;  Surgeon: Sebastian Acheheodore Manny, MD;  [facility];  Service: Urology;  Laterality: Left;    Prior to Admission medications   Medication Sig Start Date End Date Taking? Authorizing Provider  Apremilast (OTEZLA) 10 & 20 & 30 MG TBPK Take 20 mg by mouth 2 (two) times daily.   Yes Historical Provider, MD    Allergies as of 02/09/2015 - Review Complete 12/25/2012  Allergen Reaction Noted  . Lamisil [terbinafine hcl] Other (See Comments) 12/24/2012    History reviewed. No pertinent family history.  History   Social History  . Marital Status: Married    Spouse Name: N/A  . Number of Children: N/A  . Years of Education: N/A   Occupational History  . Not on file.   Social History Main Topics  . Smoking status: Never Smoker   . Smokeless tobacco: Never Used  . Alcohol Use: No  . Drug Use: No  .  Sexual Activity: Not on file   Other Topics Concern  . Not on file   Social History Narrative    Review of Systems: See HPI, otherwise negative ROS  Physical Exam: BP 130/70 mmHg  Pulse 50  Temp(Src) 97.7 F (36.5 C) (Temporal)  Resp 16  Ht 5\' 5"  (1.651 m)  Wt 229 lb (103.874 kg)  BMI 38.11 kg/m2  SpO2 100% General:   Alert,  pleasant and cooperative in NAD Head:  Normocephalic and atraumatic. Neck:  Supple; no masses or thyromegaly. Lungs:  Clear throughout to auscultation.    Heart:  Regular rate and rhythm. Abdomen:  Soft, nontender and nondistended. Normal bowel sounds, without guarding, and without rebound.   Neurologic:  Alert and  oriented x4;  grossly normal neurologically.  Impression/Plan: Morgan Mcclain is now here to undergo a screening colonoscopy.  Risks, benefits, and alternatives regarding colonoscopy have been reviewed with the patient.  Questions have been answered.  All parties agreeable.

## 2015-03-04 NOTE — Anesthesia Procedure Notes (Signed)
Procedure Name: MAC Performed by: Baylin Gamblin Pre-anesthesia Checklist: Patient identified, Emergency Drugs available, Suction available, Timeout performed and Patient being monitored Patient Re-evaluated:Patient Re-evaluated prior to inductionOxygen Delivery Method: Nasal cannula Placement Confirmation: positive ETCO2       

## 2015-03-04 NOTE — Anesthesia Postprocedure Evaluation (Signed)
  Anesthesia Post-op Note  Patient: Morgan Mcclain  Procedure(s) Performed: Procedure(s): COLONOSCOPY WITH PROPOFOL (N/A)  Anesthesia type:MAC  Patient location: PACU  Post pain: Pain level controlled  Post assessment: Post-op Vital signs reviewed, Patient's Cardiovascular Status Stable, Respiratory Function Stable, Patent Airway and No signs of Nausea or vomiting  Post vital signs: Reviewed and stable  Last Vitals:  Filed Vitals:   03/04/15 1115  BP: 132/77  Pulse: 50  Temp:   Resp: 17    Level of consciousness: awake, alert  and patient cooperative  Complications: No apparent anesthesia complications

## 2015-03-04 NOTE — Anesthesia Preprocedure Evaluation (Signed)
Anesthesia Evaluation    Airway Mallampati: IV  TM Distance: >3 FB Neck ROM: full    Dental no notable dental hx.    Pulmonary sleep apnea and Continuous Positive Airway Pressure Ventilation ,    Pulmonary exam normal       Cardiovascular negative cardio ROS Normal cardiovascular exam    Neuro/Psych negative neurological ROS     GI/Hepatic negative GI ROS, Neg liver ROS,   Endo/Other  Obese, BMI 38.1  Renal/GU Renal disease  negative genitourinary   Musculoskeletal   Abdominal   Peds  Hematology negative hematology ROS (+)   Anesthesia Other Findings   Reproductive/Obstetrics                             Anesthesia Physical Anesthesia Plan  ASA: II  Anesthesia Plan: MAC   Post-op Pain Management:    Induction: Intravenous  Airway Management Planned: Nasal Cannula  Additional Equipment:   Intra-op Plan:   Post-operative Plan:   Informed Consent: I have reviewed the patients History and Physical, chart, labs and discussed the procedure including the risks, benefits and alternatives for the proposed anesthesia with the patient or authorized representative who has indicated his/her understanding and acceptance.     Plan Discussed with:   Anesthesia Plan Comments:         Anesthesia Quick Evaluation

## 2015-03-04 NOTE — Transfer of Care (Signed)
Immediate Anesthesia Transfer of Care Note  Patient: Morgan Mcclain  Procedure(s) Performed: Procedure(s): COLONOSCOPY WITH PROPOFOL (N/A)  Patient Location: PACU  Anesthesia Type: MAC  Level of Consciousness: awake, alert  and patient cooperative  Airway and Oxygen Therapy: Patient Spontanous Breathing and Patient connected to supplemental oxygen  Post-op Assessment: Post-op Vital signs reviewed, Patient's Cardiovascular Status Stable, Respiratory Function Stable, Patent Airway and No signs of Nausea or vomiting  Post-op Vital Signs: Reviewed and stable  Complications: No apparent anesthesia complications

## 2015-03-07 ENCOUNTER — Encounter: Payer: Self-pay | Admitting: Gastroenterology

## 2016-05-18 ENCOUNTER — Emergency Department
Admission: EM | Admit: 2016-05-18 | Discharge: 2016-05-18 | Disposition: A | Discharge status: Home / Self Care | Payer: Medicare Other | Attending: Emergency Medicine | Admitting: Emergency Medicine

## 2016-05-18 ENCOUNTER — Emergency Department: Payer: Medicare Other

## 2016-05-18 ENCOUNTER — Encounter: Payer: Self-pay | Admitting: Emergency Medicine

## 2016-05-18 DIAGNOSIS — R2243 Localized swelling, mass and lump, lower limb, bilateral: Secondary | ICD-10-CM | POA: Diagnosis present

## 2016-05-18 DIAGNOSIS — R600 Localized edema: Secondary | ICD-10-CM | POA: Diagnosis not present

## 2016-05-18 DIAGNOSIS — R0602 Shortness of breath: Secondary | ICD-10-CM | POA: Insufficient documentation

## 2016-05-18 DIAGNOSIS — R0789 Other chest pain: Secondary | ICD-10-CM | POA: Insufficient documentation

## 2016-05-18 LAB — CBC WITH DIFFERENTIAL/PLATELET
BASOS ABS: 0 10*3/uL (ref 0–0.1)
BASOS PCT: 1 %
EOS ABS: 0.2 10*3/uL (ref 0–0.7)
Eosinophils Relative: 3 %
HCT: 42.2 % (ref 35.0–47.0)
HEMOGLOBIN: 14.3 g/dL (ref 12.0–16.0)
Lymphocytes Relative: 29 %
Lymphs Abs: 1.6 10*3/uL (ref 1.0–3.6)
MCH: 32 pg (ref 26.0–34.0)
MCHC: 33.8 g/dL (ref 32.0–36.0)
MCV: 94.6 fL (ref 80.0–100.0)
Monocytes Absolute: 0.6 10*3/uL (ref 0.2–0.9)
Monocytes Relative: 10 %
NEUTROS ABS: 3.3 10*3/uL (ref 1.4–6.5)
Neutrophils Relative %: 57 %
Platelets: 194 10*3/uL (ref 150–440)
RBC: 4.46 MIL/uL (ref 3.80–5.20)
RDW: 13.3 % (ref 11.5–14.5)
WBC: 5.7 10*3/uL (ref 3.6–11.0)

## 2016-05-18 LAB — TROPONIN I
Troponin I: <0.03 ng/mL (ref <0.03)
Troponin I: <0.03 ng/mL (ref <0.03)

## 2016-05-18 LAB — COMPREHENSIVE METABOLIC PANEL
ALK PHOS: 102 U/L (ref 38–126)
ALT: 12 U/L — AB (ref 14–54)
ANION GAP: 7 (ref 5–15)
AST: 20 U/L (ref 15–41)
Albumin: 4.1 g/dL (ref 3.5–5.0)
BILIRUBIN TOTAL: 0.5 mg/dL (ref 0.3–1.2)
BUN: 14 mg/dL (ref 6–20)
CALCIUM: 9.2 mg/dL (ref 8.9–10.3)
CO2: 26 mmol/L (ref 22–32)
CREATININE: 0.76 mg/dL (ref 0.44–1.00)
Chloride: 104 mmol/L (ref 101–111)
Glucose, Bld: 90 mg/dL (ref 65–99)
Potassium: 3.8 mmol/L (ref 3.5–5.1)
Sodium: 137 mmol/L (ref 135–145)
TOTAL PROTEIN: 8.2 g/dL — AB (ref 6.5–8.1)

## 2016-05-18 LAB — BRAIN NATRIURETIC PEPTIDE: B NATRIURETIC PEPTIDE 5: 42 pg/mL (ref 0.0–100.0)

## 2016-05-18 NOTE — ED Notes (Signed)
Pt. Returned to tx. Room from US c/o chest tightness. MD Malinda verbalized order for EKG. This RN completed. See further MD orders.

## 2016-05-18 NOTE — ED Notes (Signed)
Pt. Verbalizes understanding of d/c instructions, and follow-up. VS stable and pain controlled per pt.  Pt. In NAD at time of d/c and denies further concerns regarding this visit. Pt. Stable at the time of departure from the unit, departing unit by the safest and most appropriate manner per that pt condition and limitations. Pt advised to return to the ED at any time for emergent concerns, or for new/worsening symptoms.   

## 2016-05-18 NOTE — ED Triage Notes (Signed)
C/o bilat leg pain and swelling for a "long while" worse over the past few days.  Saw MD today and sent to eval for dvt in left leg.  Swelling noted bilat.  No redness or hard areas noted.  =PMS to feet.

## 2016-05-18 NOTE — ED Notes (Signed)
Pt. Given crackers and PB with water per request and MD approval.

## 2016-05-18 NOTE — ED Provider Notes (Signed)
[facility] Emergency Department Provider Note   ____________________________________________   First MD Initiated Contact with Patient 05/18/16 1831     (approximate)  I have reviewed the triage vital signs and the nursing notes.   HISTORY  Chief Complaint Leg Swelling    HPI Morgan Mcclain is a 65 y.o. female patient was sent in because of leg swelling her doctor wanted to get a left leg DVT ultrasound. On further questioning patient reports she's had leg swelling for over a year or more. She also has numbness and tingling in her legs for over a year or more. Her legs and feet hurt if she walks for more than an hour or so. He ports she usually doesn't walk more than from one room to the next or out to the car. Since his been going on for many months. Patient reports her feet are numb especially left foot. She reports that in Winter her B12 and vitamin D levels were low when she was getting oral supplementation. This resulted in an increase of the levels and so the supplementation was stopped possibly in March. Patient does not remember any change in her numbness. On return to the ER from ultrasound patient reported pain in her chest across her breasts. Felt tight. There did not seem to be any nausea or sweating. She did was short of breath a little bit. She reports she gets shortness of breath with exertion. The more she exerts herself more short of breath she gets. Symptoms resolve with rest. Patient thinks she might get chest tightness with exertion as well. The tightness again is across her breasts.   Past Medical History:  Diagnosis Date  . Arthritis   . Left ureteral calculus   . Psoriasis SEVERE--  RECEIVES LASE TX'S TWICE WEEKLY  . Renal calculus, left   . Sleep apnea 02/23/2015   got CPAP machine today    Patient Active Problem List   Diagnosis Date Noted  . Special screening for malignant neoplasms, colon     Past Surgical History:    Procedure Laterality Date  . ABDOMINAL HYSTERECTOMY  1994   W/ UNILATERAL SALPINGOOPHORECTOMY  . COLONOSCOPY WITH PROPOFOL N/A 03/04/2015   Procedure: COLONOSCOPY WITH PROPOFOL;  Surgeon: Midge Minium, MD;  Location: [facility];  Service: Endoscopy;  Laterality: N/A;  . CYSTOSCOPY WITH RETROGRADE PYELOGRAM, URETEROSCOPY AND STENT PLACEMENT Left 12/25/2012   Procedure: CYSTOSCOPY WITH RETROGRADE PYELOGRAM, URETEROSCOPY AND STENT PLACEMENT;  Surgeon: Sebastian Ache, MD;  Location: [facility];  Service: Urology;  Laterality: Left;  90 MIN NEEDS DIG URETEROSCOPE   . HOLMIUM LASER APPLICATION Left 12/25/2012   Procedure: HOLMIUM LASER APPLICATION;  Surgeon: Sebastian Ache, MD;  Location: [facility];  Service: Urology;  Laterality: Left;  . TONSILLECTOMY  AGE 28    Prior to Admission medications   Medication Sig Start Date End Date Taking? Authorizing Provider  Apremilast (OTEZLA) 10 & 20 & 30 MG TBPK Take 20 mg by mouth 2 (two) times daily.    Historical Provider, MD    Allergies Lamisil [terbinafine hcl] and Shellfish allergy  No family history on file.  Social History Social History  Substance Use Topics  . Smoking status: Never Smoker  . Smokeless tobacco: Never Used  . Alcohol use No    Review of Systems Constitutional: No fever/chills Eyes: No visual changes. ENT: No sore throat. Cardiovascular:See history of present illness. Respiratory: See history of present illness Gastrointestinal: No abdominal pain.  No nausea, no vomiting.  No diarrhea.  No constipation. Genitourinary: Negative for dysuria. Musculoskeletal: Negative for back pain. Skin: Negative for rash. Neurological: Negative for headaches, see history of present illness.  10-point ROS otherwise negative.  ____________________________________________   PHYSICAL EXAM:  VITAL SIGNS: ED Triage Vitals [05/18/16 1734]  Enc Vitals Group     BP (!) 149/71     Pulse Rate (!)  58     Resp      Temp 97.7 F (36.5 C)     Temp Source Oral     SpO2 100 %     Weight 242 lb (109.8 kg)     Height 5\' 6"  (1.676 m)     Head Circumference      Peak Flow      Pain Score 8     Pain Loc      Pain Edu?      Excl. in GC?     Constitutional: Alert and oriented. Well appearing and in no acute distress. Eyes: Conjunctivae are normal. PERRL. EOMI. Head: Atraumatic. Nose: No congestion/rhinnorhea. Mouth/Throat: Mucous membranes are moist.  Oropharynx non-erythematous. Neck: No stridor. Cardiovascular: Normal rate, regular rhythm. Grossly normal heart sounds.  Good peripheral circulation. Respiratory: Normal respiratory effort.  No retractions. Lungs CTAB. Gastrointestinal: Soft and nontender. No distention. No abdominal bruits. No CVA tenderness. Musculoskeletal: Bilateral lower leg 1+ edema. There is numbness to light touch sharpened all and 2 point discrimination especially in the left foot although both feet are not as good as the hand..  No joint effusions. Neurologic:  Normal speech and language. Radial nerves II through XII are intact strength seems to be normal sensation exam C musculoskeletal above Specifically No gait instability. Skin:  Skin is warm, dry and intact. No rash noted. Psychiatric: Mood and affect are normal. Speech and behavior are normal.  ____________________________________________   LABS (all labs ordered are listed, but only abnormal results are displayed)  Labs Reviewed  COMPREHENSIVE METABOLIC PANEL - Abnormal; Notable for the following:       Result Value   Total Protein 8.2 (*)    ALT 12 (*)    All other components within normal limits  CBC WITH DIFFERENTIAL/PLATELET  TROPONIN I  BRAIN NATRIURETIC PEPTIDE  VITAMIN B12  FOLATE RBC  TROPONIN I   ____________________________________________  EKG  EKG read and interpreted by me shows sinus bradycardia rate of 55 normal axis diffuse ST-T wave  flattening. ____________________________________________  RADIOLOGY  CLINICAL DATA:  Left lower extremity pain and edema, chronic  EXAM: LEFT LOWER EXTREMITY VENOUS DUPLEX ULTRASOUND  TECHNIQUE: Gray-scale sonography with graded compression, as well as color Doppler and duplex ultrasound were performed to evaluate the left lower extremity deep venous system from the level of the common femoral vein and including the common femoral, femoral, profunda femoral, popliteal and calf veins including the posterior tibial, peroneal and gastrocnemius veins when visible. The superficial great saphenous vein was also interrogated. Spectral Doppler was utilized to evaluate flow at rest and with distal augmentation maneuvers in the common femoral, femoral and popliteal veins.  COMPARISON:  None.  FINDINGS: Contralateral Common Femoral Vein: Respiratory phasicity is normal and symmetric with the symptomatic side. No evidence of thrombus. Normal compressibility.  Common Femoral Vein: No evidence of thrombus. Normal compressibility, respiratory phasicity and response to augmentation.  Saphenofemoral Junction: No evidence of thrombus. Normal compressibility and flow on color Doppler imaging.  Profunda Femoral Vein: No evidence of thrombus. Normal compressibility and flow on color Doppler  imaging.  Femoral Vein: No evidence of thrombus. Normal compressibility, respiratory phasicity and response to augmentation.  Popliteal Vein: No evidence of thrombus. Normal compressibility, respiratory phasicity and response to augmentation.  Calf Veins: No evidence of thrombus. Normal compressibility and flow on color Doppler imaging.  Superficial Great Saphenous Vein: No evidence of thrombus. Normal compressibility and flow on color Doppler imaging.  Venous Reflux:  None.  Other Findings:  None.  IMPRESSION: No evidence of left lower extremity deep venous thrombosis. Right common  femoral vein also patent.   Electronically Signed   By: Bretta Bang III M.D.   On: 05/18/2016 19:13 PORTABLE CHEST 1 VIEW  COMPARISON:  None.  FINDINGS: The lungs are clear. Heart is mildly enlarged with pulmonary vascularity within normal limits. No adenopathy. There is atherosclerotic calcification in the aortic arch. No bone lesions.  IMPRESSION: Mild cardiac enlargement. Aortic atherosclerosis. No edema or consolidation.   Electronically Signed   By: Bretta Bang III M.D.   On: 05/18/2016 20:19 ____________________________________________   PROCEDURES  Procedure(s) performed:   Procedures  Critical Care performed:   ____________________________________________   INITIAL IMPRESSION / ASSESSMENT AND PLAN / ED COURSE  Pertinent labs & imaging results that were available during my care of the patient were reviewed by me and considered in my medical decision making (see chart for details).  Patient still very very nauseated and unable to keep things down in spite of Zofran and Phenergan she is very sleepy from the Phenergan was looking keep things down and also having diarrhea.  Clinical Course     ____________________________________________   FINAL CLINICAL IMPRESSION(S) / ED DIAGNOSES  Final diagnoses:  Bilateral lower extremity edema  Chest pain, atypical      NEW MEDICATIONS STARTED DURING THIS VISIT:  New Prescriptions   No medications on file     Note:  This document was prepared using Dragon voice recognition software and may include unintentional dictation errors.    Arnaldo Natal, MD 05/18/16 2259

## 2016-05-18 NOTE — ED Notes (Signed)
MD Malinda at bedside. 

## 2016-05-18 NOTE — Discharge Instructions (Signed)
Please call Dr.Gollan the cardiologist on Monday morning. Please tell them you were in the emergency room and have leg swelling shortness of breath and chest pain when you exert herself. He should be able to check on you for those things Monday or Tuesday. Please return here if you get worsening symptoms. Especially return here if they do not go away with rest. Please call Dr.Shah the neurologist he can follow your further numbness or having in your legs. Please also follow-up with your regular doctor. It might be easier to see her regular doctor quick way. And your regular doctor should be able to check on the B12 and folate levels that I ordered today. Above all please return if she feels sicker or worse in any way.

## 2016-05-18 NOTE — ED Notes (Signed)
Patient transported to Ultrasound 

## 2016-05-19 LAB — VITAMIN B12: VITAMIN B 12: 251 pg/mL (ref 180–914)

## 2016-05-21 LAB — FOLATE RBC
Folate, Hemolysate: 311.1 ng/mL
Folate, RBC: 774 ng/mL (ref >498)
Hematocrit: 40.2 % (ref 34.0–46.6)

## 2016-05-24 ENCOUNTER — Encounter: Payer: Self-pay | Admitting: Family Medicine

## 2016-06-01 ENCOUNTER — Encounter: Payer: Self-pay | Admitting: *Deleted

## 2016-06-04 ENCOUNTER — Encounter: Payer: Self-pay | Admitting: Family Medicine

## 2016-06-04 ENCOUNTER — Ambulatory Visit (INDEPENDENT_AMBULATORY_CARE_PROVIDER_SITE_OTHER): Payer: Medicare Other | Admitting: Family Medicine

## 2016-06-04 VITALS — BP 138/82 | HR 59 | Temp 97.7°F | Resp 16 | Ht 63.5 in | Wt 247.0 lb

## 2016-06-04 DIAGNOSIS — R7309 Other abnormal glucose: Secondary | ICD-10-CM | POA: Diagnosis not present

## 2016-06-04 DIAGNOSIS — I890 Lymphedema, not elsewhere classified: Secondary | ICD-10-CM | POA: Insufficient documentation

## 2016-06-04 DIAGNOSIS — Z6841 Body Mass Index (BMI) 40.0 and over, adult: Secondary | ICD-10-CM

## 2016-06-04 DIAGNOSIS — Z131 Encounter for screening for diabetes mellitus: Secondary | ICD-10-CM | POA: Diagnosis not present

## 2016-06-04 DIAGNOSIS — R5383 Other fatigue: Secondary | ICD-10-CM | POA: Diagnosis not present

## 2016-06-04 DIAGNOSIS — Z13220 Encounter for screening for lipoid disorders: Secondary | ICD-10-CM

## 2016-06-04 DIAGNOSIS — Z1159 Encounter for screening for other viral diseases: Secondary | ICD-10-CM | POA: Diagnosis not present

## 2016-06-04 DIAGNOSIS — R030 Elevated blood-pressure reading, without diagnosis of hypertension: Secondary | ICD-10-CM | POA: Insufficient documentation

## 2016-06-04 DIAGNOSIS — Z0000 Encounter for general adult medical examination without abnormal findings: Secondary | ICD-10-CM

## 2016-06-04 DIAGNOSIS — R600 Localized edema: Secondary | ICD-10-CM

## 2016-06-04 DIAGNOSIS — L409 Psoriasis, unspecified: Secondary | ICD-10-CM

## 2016-06-04 DIAGNOSIS — G4733 Obstructive sleep apnea (adult) (pediatric): Secondary | ICD-10-CM

## 2016-06-04 DIAGNOSIS — Z114 Encounter for screening for human immunodeficiency virus [HIV]: Secondary | ICD-10-CM | POA: Diagnosis not present

## 2016-06-04 DIAGNOSIS — L4050 Arthropathic psoriasis, unspecified: Secondary | ICD-10-CM | POA: Insufficient documentation

## 2016-06-04 DIAGNOSIS — E559 Vitamin D deficiency, unspecified: Secondary | ICD-10-CM

## 2016-06-04 LAB — LIPID PANEL
CHOLESTEROL: 203 mg/dL — AB (ref 125–200)
HDL: 48 mg/dL (ref >=46)
LDL Cholesterol: 134 mg/dL — ABNORMAL HIGH (ref <130)
Total CHOL/HDL Ratio: 4.2 Ratio (ref <=5.0)
Triglycerides: 105 mg/dL (ref <150)
VLDL: 21 mg/dL (ref <30)

## 2016-06-04 NOTE — Progress Notes (Signed)
Subjective:    Patient ID: Morgan Mcclain, female    DOB: 05-05-1951, 65 y.o.   MRN: [id number]  Morgan Mcclain is a 65 y.o. female presenting on 06/04/2016 for Establish Care; Leg Pain (leg and feet); Fatigue; and Psoriasis  Previously established with PCP at [facility], now no longer works near there, and establishing closer to home.  HPI   Psoriasis, Chronic (without known Psoriatic Arthritis): - Reports chronic problem for many years, followed by Dermatology (Dr Jesusita Oka at [facility]), she was treated in past with Methotrexate and Folic Acid for years, then to avoid chronic MTX switched to [place] 30mg  BID + Laser treatments, had initial improvement, then some recent worsening flare ups over past 3-5 months. Areas of skin rash and symptoms mostly elbows, upper ext, back and abdomen. - Denies prior diagnosis of psoriatic arthritis - She is interested in returning to Dermatology to continue or adjust the Otezla medication, currently still taking this med  MORBID OBESITY BMI >43 with continued Weight Gain / FATIGUE / Vitamin D deficiency / SLEEP APNEA - Chronic history of overweight recently, however she was able to lose 30 lbs within past 1-2 years after retirement and lifestyle changes, but has gradually regained some weight, about +15 lbs in 3 months. - No regular exercise regimen currently, but now has [place] seismologist at [facility], plans to start going more often - Prior history of elevated blood sugar 122 back in 2014 (unsure if fasting), no prior A1c screening available, no known family history of DM - Unsure last lipid panel, she is fasting today - Prior history of low Vitamin D by report, no lab values available, no longer taking supplement or MVI  Elevated BP / Pre-HTN - Denies any prior dx of HTN, but has had some borderline elevated BP. No significant family history of HTN either. Never been treated for elevated BP. Does report prior history  within past 1-2 years of sleep study for sleep apnea, was dx with mild OSA, tried CPAP initially had several problems and this was discontinued. - Denies current apnea spells overnight or excessive sleepiness  BILATERAL LOWER EXT EDEMA: - Recent ED visit 05/18/16, sent for evaluation of Left LE swelling has had bilateral edema, does report chronic swelling >1 year, with some intermittent associated numbness and tingling, worse with prolonged activity and walking. Had Left LE venous US which was negative for any DVT. Additionally had work-up with Troponin testing for transient chest tightness in ED following some exertion. - Prior history of low vitamin B12 (lab in ED was 251) - Taking Ibuprofen and Tylenol PRn discomfort in legs - Wearing some compression stockings as needed and trying to keep elevated with improvement - Denies any prior history of DVT or PE. No family history of blood clots  Health maintenance: - Due for mammo, last 2016, never had abnormal, no prior biopsy. No family history of breast cancer - S/p hysterectomy 1993, thought to be full hysterectomy, no prior abnormal, patient not interested in repeat pap smear at this time - Last colonoscopy 2016, negative and good for 10 years. No fam history of colon cancer - Last DEXA 2016, Normal results, no frax - UTD on Flu Shot (05/08/16), TDAp 2013 - Due for routine HIV and Hep C screening   Past Medical History:  Diagnosis Date  . Arthritis   . Left ureteral calculus   . Psoriasis SEVERE--  RECEIVES LASE TX'S TWICE WEEKLY  . Renal calculus, left   .  Sleep apnea 02/23/2015   No longer on CPAP   Social History   Social History  . Marital status: Married    Spouse name: N/A  . Number of children: N/A  . Years of education: Administrative Degree 6 yr   Occupational History  . Retired Geologist, engineering- Educator    Social History Main Topics  . Smoking status: Never Smoker  . Smokeless tobacco: Never Used  . Alcohol use No  . Drug use: No   . Sexual activity: Not on file   Other Topics Concern  . Not on file   Social History Narrative  . No narrative on file   Family History  Problem Relation Age of Onset  . Heart failure Mother   . Arthritis Mother   . Heart attack Mother [id number]    Secondary CVA  . Cancer Father     lung cancer  . Heart disease Sister [id number]   Current Outpatient Prescriptions on File Prior to Visit  Medication Sig  . Apremilast (OTEZLA) 10 & 20 & 30 MG TBPK Take 20 mg by mouth 2 (two) times daily.   No current facility-administered medications on file prior to visit.     Review of Systems  Constitutional: Positive for fatigue. Negative for activity change, appetite change, chills, diaphoresis and fever.  HENT: Negative for congestion, hearing loss and sinus pressure.   Eyes: Negative for visual disturbance.  Respiratory: Negative for cough, chest tightness, shortness of breath and wheezing.   Cardiovascular: Positive for leg swelling. Negative for chest pain and palpitations.  Gastrointestinal: Negative for abdominal pain, blood in stool, constipation, diarrhea, nausea and vomiting.  Endocrine: Negative for cold intolerance, heat intolerance and polyuria.  Genitourinary: Negative for dysuria, frequency and hematuria.  Musculoskeletal: Negative for arthralgias, back pain, joint swelling and neck pain.  Skin: Positive for rash (psoriasis, chronic).  Allergic/Immunologic: Negative for environmental allergies.  Neurological: Negative for dizziness, weakness, light-headedness, numbness and headaches.  Hematological: Negative for adenopathy.  Psychiatric/Behavioral: Negative for behavioral problems, confusion, decreased concentration, dysphoric mood and sleep disturbance.   Per HPI unless specifically indicated above     Objective:    BP 138/82 (BP Location: Left Arm, Cuff Size: Normal)   Pulse (!) 59   Temp 97.7 F (36.5 C) (Oral)   Resp 16   Ht 5' 3.5" (1.613 m)   Wt 247 lb (112 kg)   BMI  43.07 kg/m   Wt Readings from Last 3 Encounters:  06/04/16 247 lb (112 kg)  05/18/16 242 lb (109.8 kg)  03/04/15 229 lb (103.9 kg)    Physical Exam  Constitutional: She is oriented to person, place, and time. She appears well-developed and well-nourished. No distress.  Obese, well-appearing, comfortable, cooperative  HENT:  Head: Normocephalic and atraumatic.  Mouth/Throat: Oropharynx is clear and moist.  Eyes: Conjunctivae and EOM are normal. Pupils are equal, round, and reactive to light.  Neck: Normal range of motion. Neck supple. No thyromegaly present.  Cardiovascular: Normal rate, regular rhythm, normal heart sounds and intact distal pulses.   No murmur heard. Pulmonary/Chest: Effort normal and breath sounds normal. No respiratory distress. She has no wheezes. She has no rales.  Abdominal: Soft. Bowel sounds are normal. She exhibits no distension and no mass. There is no tenderness.  Musculoskeletal: Normal range of motion. She exhibits edema (bilateral non-pitting edema lower legs, L>R, no erythema, non-tender calves). She exhibits no tenderness.  Back normal without deformity.  Upper / Lower Extremities: - Normal muscle tone, strength bilateral  upper extremities 5/5, lower extremities 5/5 - Bilateral shoulders, knees, wrist, ankles without deformity, tenderness, effusion - Normal Gait  Lymphadenopathy:    She has no cervical adenopathy.  Neurological: She is alert and oriented to person, place, and time.  Skin: Skin is warm and dry. Rash (Scattered plaques with some mild erythema and dry scaley skin consistent with psoriasis on bilateral elbows extensor surfaces) noted. She is not diaphoretic.  Psychiatric: She has a normal mood and affect. Her behavior is normal. Thought content normal.  Nursing note and vitals reviewed.  I have personally reviewed recent lab results from 05/18/16.  Results for orders placed or performed during the hospital encounter of 05/18/16    Comprehensive metabolic panel  Result Value Ref Range   Sodium 137 135 - 145 mmol/L   Potassium 3.8 3.5 - 5.1 mmol/L   Chloride 104 101 - 111 mmol/L   CO2 26 22 - 32 mmol/L   Glucose, Bld 90 65 - 99 mg/dL   BUN 14 6 - 20 mg/dL   Creatinine, Ser 1.61 0.44 - 1.00 mg/dL   Calcium 9.2 8.9 - 09.6 mg/dL   Total Protein 8.2 (H) 6.5 - 8.1 g/dL   Albumin 4.1 3.5 - 5.0 g/dL   AST 20 15 - 41 U/L   ALT 12 (L) 14 - 54 U/L   Alkaline Phosphatase 102 38 - 126 U/L   Total Bilirubin 0.5 0.3 - 1.2 mg/dL   GFR calc non Af Amer >60 >60 mL/min   GFR calc Af Amer >60 >60 mL/min   Anion gap 7 5 - 15  CBC with Differential  Result Value Ref Range   WBC 5.7 3.6 - 11.0 K/uL   RBC 4.46 3.80 - 5.20 MIL/uL   Hemoglobin 14.3 12.0 - 16.0 g/dL   HCT 04.5 40.9 - 81.1 %   MCV 94.6 80.0 - 100.0 fL   MCH 32.0 26.0 - 34.0 pg   MCHC 33.8 32.0 - 36.0 g/dL   RDW 91.4 78.2 - 95.6 %   Platelets 194 150 - 440 K/uL   Neutrophils Relative % 57 %   Neutro Abs 3.3 1.4 - 6.5 K/uL   Lymphocytes Relative 29 %   Lymphs Abs 1.6 1.0 - 3.6 K/uL   Monocytes Relative 10 %   Monocytes Absolute 0.6 0.2 - 0.9 K/uL   Eosinophils Relative 3 %   Eosinophils Absolute 0.2 0 - 0.7 K/uL   Basophils Relative 1 %   Basophils Absolute 0.0 0 - 0.1 K/uL  Troponin I  Result Value Ref Range   Troponin I <0.03 <0.03 ng/mL  Brain natriuretic peptide  Result Value Ref Range   B Natriuretic Peptide 42.0 0.0 - 100.0 pg/mL  Vitamin B12  Result Value Ref Range   Vitamin B-12 251 180 - 914 pg/mL  Folate RBC  Result Value Ref Range   Folate, Hemolysate 311.1 Not Estab. ng/mL   Hematocrit 40.2 34.0 - 46.6 %   Folate, RBC 774 >498 ng/mL  Troponin I  Result Value Ref Range   Troponin I <0.03 <0.03 ng/mL      I have reviewed the radiology report of Left Lower Extremity Venous US 05/18/16 (performed in ED) IMPRESSION: No evidence of left lower extremity deep venous thrombosis. Right common femoral vein also patent.   Assessment & Plan:    Problem List Items Addressed This Visit    Vitamin D deficiency    History of low Vit D, no results to compare Check Vitamin D  Relevant Orders   VITAMIN D 25 Hydroxy (Vit-D Deficiency, Fractures)   Sleep apnea    Stable off CPAP, without worsening apnea or sleepiness symptoms. Concern given mild elevated BP, with trend towards Pre-HTN suspect this could be related to both weight and underlying OSA. Recommend lifestyle modifications for weight loss, in future if any worsening, consider repeat CPAP titration      Psoriasis    Recent worsening, previously stable, on Otezla and s/p laser treatments (no longer on MTX) No dx psoriatic arthritis Advised to follow-up with Dr Cheree Ditto [facility]) to adjust [place], consider additional therapies or alternatives      Pre-hypertension    Mild elevated BP initially, no formal dx HTN. Risk factors with morbid obesity and age. Possible etiology of elevated BP with underlying mild OSA (uncontrolled) Improved on re-check, most consistent with pre-HTN. Lifestyle modifications, may be controlled with weight loss      Morbid obesity with BMI of 40.0-44.9, adult (HCC) - Primary    Worsening recent weight gain. Likely increasing risk of progression to HTN, likely cause of OSA. No significant fam history DM or HLD - Check A1c, Lipids fasting, TSH - Lifestyle modifications      Relevant Orders   Lipid panel   TSH   Hemoglobin A1c   Healthcare maintenance    - Given card for patient to schedule next routine screen mammo - Next colonoscopy 2026, may consider cologuard - recent dexa normal, follow-up within 1-2 years if interested or sooner if fracture - Declines repeat pap, no prior abnormal, s/p hysterectomy 1993, suspects has cervix remaining      Fatigue    Stable, chronic problem. Likely related to obesity, decreased relative activity, possible persistent vitamin d deficiency. Check TSH, Vitamin D, A1c      Relevant Orders    VITAMIN D 25 Hydroxy (Vit-D Deficiency, Fractures)   Bilateral lower extremity edema    Improved, recent worsening of chronic problem >1 year. No significant known other etiology with stable kidney function or electrolyte imbalance on recent labs, no known CHF or heart disease (no prior ECHO) negative BNP in ED, EKG without significant LVH, normal LFTs. Likely secondary to obesity and sedentary lifestyle - LLE Venous US negative for DVT 9/22  Plan: 1. Reassurance today, maximize conservative therapy with compression stockings, appropriate elevation, stay active and inc regular exercise, stay hydrated 2. Follow-up if not improving, consider future ECHO given some mild chest tightness with exertion by report       Other Visit Diagnoses    Screening cholesterol level       Relevant Orders   Lipid panel   Screening for diabetes mellitus       Relevant Orders   Hemoglobin A1c   Abnormal glucose       Relevant Orders   Hemoglobin A1c   Need for hepatitis C screening test       Relevant Orders   Hepatitis C antibody   Screening for HIV (human immunodeficiency virus)       Relevant Orders   HIV antibody          Follow up plan: Return in about 6 months (around 12/03/2016) for weight, , blood pressure, labs.  Saralyn Pilar, DO [facility] 06/04/2016, 4:07 PM

## 2016-06-04 NOTE — Assessment & Plan Note (Signed)
-   Given card for patient to schedule next routine screen mammo - Next colonoscopy 2026, may consider cologuard - recent dexa normal, follow-up within 1-2 years if interested or sooner if fracture - Declines repeat pap, no prior abnormal, s/p hysterectomy 1993, suspects has cervix remaining

## 2016-06-04 NOTE — Assessment & Plan Note (Signed)
Recent worsening, previously stable, on Otezla and s/p laser treatments (no longer on MTX) No dx psoriatic arthritis Advised to [facility] to adjust [place], consider additional therapies or alternatives

## 2016-06-04 NOTE — Assessment & Plan Note (Signed)
History of low Vit D, no results to compare Check Vitamin D

## 2016-06-04 NOTE — Patient Instructions (Signed)
Thank you for coming in to clinic today.  1. Labs today as discussed, will send your results on MyChart in a few days and office will call you 2. Keep legs elevated and use compression for swelling 3. Contact [facility] Dr Cheree DittoGraham to follow-up about psoriasis and discuss med adjustment  Recommend to work on general lifestyle changes to lose weight and improve health.  The 5 Minute Rule of Exercise - Promise yourself to at least do 5 minutes of exercise (make sure you time it), and if at the end of 5 minutes (this is the hardest part of the work-out), if you still feel like you want stop (or not motivated to continue) then allow yourself to stop. Otherwise, more often than not you will feel encouraged that you can continue for a little while longer or even more!  Diet Recommendations for Healthy Low Carb Diet  Starchy (carb) foods include: Bread, rice, pasta, potatoes, corn, crackers, bagels, muffins, all baked goods.   Protein foods include: Meat, fish, poultry, eggs, dairy foods, and beans such as pinto and kidney beans (beans also provide carbohydrate).   1. Eat at least 3 meals and 1-2 snacks per day. Never go more than 4-5 hours while awake without eating.   2. Limit starchy foods to TWO per meal and ONE per snack. ONE portion of a starchy  food is equal to the following:   - ONE slice of bread (or its equivalent, such as half of a hamburger bun).   - 1/2 cup of a "scoopable" starchy food such as potatoes or rice.   - 1 OUNCE (28 grams) of starchy snacks (crackers or pretzels, look on label).   - 15 grams of carbohydrate as shown on food label.   3. Both lunch and dinner should include a protein food, a carb food, and vegetables.   - Obtain twice as many veg's as protein or carbohydrate foods for both lunch and dinner.   - Try to keep frozen veg's on hand for a quick vegetable serving.     - Fresh or frozen veg's are best.   4. Breakfast should always include  protein.     Please schedule a follow-up appointment with Dr. Althea CharonKaramalegos in 3 to 6 months for Weight / Lab follow-up, BP re-check  If you have any other questions or concerns, please feel free to call the clinic or send a message through MyChart. You may also schedule an earlier appointment if necessary.  Saralyn PilarAlexander Mistey Hoffert, DO [facility]

## 2016-06-04 NOTE — Assessment & Plan Note (Addendum)
Worsening recent weight gain. Likely increasing risk of progression to HTN, likely cause of OSA. No significant fam history DM or HLD - Check A1c, Lipids fasting, TSH - Lifestyle modifications

## 2016-06-04 NOTE — Assessment & Plan Note (Signed)
Stable, chronic problem. Likely related to obesity, decreased relative activity, possible persistent vitamin d deficiency. Check TSH, Vitamin D, A1c

## 2016-06-04 NOTE — Assessment & Plan Note (Signed)
Mild elevated BP initially, no formal dx HTN. Risk factors with morbid obesity and age. Possible etiology of elevated BP with underlying mild OSA (uncontrolled) Improved on re-check, most consistent with pre-HTN. Lifestyle modifications, may be controlled with weight loss

## 2016-06-04 NOTE — Assessment & Plan Note (Signed)
Improved, recent worsening of chronic problem >1 year. No significant known other etiology with stable kidney function or electrolyte imbalance on recent labs, no known CHF or heart disease (no prior ECHO) negative BNP in ED, EKG without significant LVH, normal LFTs. Likely secondary to obesity and sedentary lifestyle - LLE Venous US negative for DVT 9/22  Plan: 1. Reassurance today, maximize conservative therapy with compression stockings, appropriate elevation, stay active and inc regular exercise, stay hydrated 2. Follow-up if not improving, consider future ECHO given some mild chest tightness with exertion by report

## 2016-06-04 NOTE — Assessment & Plan Note (Signed)
Stable off CPAP, without worsening apnea or sleepiness symptoms. Concern given mild elevated BP, with trend towards Pre-HTN suspect this could be related to both weight and underlying OSA. Recommend lifestyle modifications for weight loss, in future if any worsening, consider repeat CPAP titration

## 2016-06-05 LAB — HIV ANTIBODY (ROUTINE TESTING W REFLEX): HIV: NONREACTIVE

## 2016-06-05 LAB — HEMOGLOBIN A1C
HEMOGLOBIN A1C: 5.7 % — AB (ref <5.7)
MEAN PLASMA GLUCOSE: 117 mg/dL

## 2016-06-05 LAB — VITAMIN D 25 HYDROXY (VIT D DEFICIENCY, FRACTURES): VIT D 25 HYDROXY: 15 ng/mL — AB (ref 30–100)

## 2016-06-05 LAB — TSH: TSH: 0.91 m[IU]/L

## 2016-06-05 LAB — HEPATITIS C ANTIBODY: HCV AB: NEGATIVE

## 2016-06-06 ENCOUNTER — Other Ambulatory Visit: Payer: Self-pay | Admitting: Family Medicine

## 2016-06-06 ENCOUNTER — Encounter: Payer: Self-pay | Admitting: Family Medicine

## 2016-06-06 DIAGNOSIS — E559 Vitamin D deficiency, unspecified: Secondary | ICD-10-CM

## 2016-06-06 DIAGNOSIS — R7303 Prediabetes: Secondary | ICD-10-CM | POA: Insufficient documentation

## 2016-06-06 MED ORDER — VITAMIN D3 125 MCG (5000 UT) PO CAPS: 5000.0000 [IU] | ORAL_CAPSULE | Freq: Every day | ORAL | 0 refills | Status: DC

## 2016-06-29 ENCOUNTER — Ambulatory Visit (INDEPENDENT_AMBULATORY_CARE_PROVIDER_SITE_OTHER): Payer: Medicare Other | Admitting: Family Medicine

## 2016-06-29 ENCOUNTER — Encounter: Payer: Self-pay | Admitting: Family Medicine

## 2016-06-29 VITALS — BP 140/80 | HR 50 | Temp 97.4°F | Resp 16 | Ht 63.5 in | Wt 248.0 lb

## 2016-06-29 DIAGNOSIS — M549 Dorsalgia, unspecified: Secondary | ICD-10-CM | POA: Diagnosis not present

## 2016-06-29 DIAGNOSIS — M9902 Segmental and somatic dysfunction of thoracic region: Secondary | ICD-10-CM

## 2016-06-29 DIAGNOSIS — S46911A Strain of unspecified muscle, fascia and tendon at shoulder and upper arm level, right arm, initial encounter: Secondary | ICD-10-CM

## 2016-06-29 DIAGNOSIS — R030 Elevated blood-pressure reading, without diagnosis of hypertension: Secondary | ICD-10-CM | POA: Diagnosis not present

## 2016-06-29 MED ORDER — BACLOFEN 10 MG PO TABS: 5.0000 mg | ORAL_TABLET | Freq: Three times a day (TID) | ORAL | 0 refills | Status: DC | PRN

## 2016-06-29 NOTE — Assessment & Plan Note (Signed)
Consistent with Right scapular muscle spasm and assoc trapezius muscle spasm with occasional pains radiating to neck. Possible lifting injury strain. No neurological deficits or weakness.  Plan: 1. Performed OMT today (Right scapular myofascial release, levator scapular myofascial release, and soft tissue trapezius/scapular region) discussed, advised of potential rebound pain, consented, tolerated techniques well with moderate improvement. 2. Continue Aleve 1-2 BID regularly and Tylenol PRN 3. Trial on Baclofen muscle relaxant 5-10mg  TID PRN, caution sedation 4. Recommend regular use heating pad / moist heat, stretching and soft tissue massage techniques as demosntrated, avoid heavy lifting / repetitive activities 5. Follow-up within 4 weeks, may consider trigger point injection in future, may consider chiropractor if recurrent problem

## 2016-06-29 NOTE — Assessment & Plan Note (Signed)
Persistent mild borderline elevated Bp today 140/80 on manual re-check. No formal dx HTN. Risk factors with morbid obesity and age. Concern with history of mild OSA not on CPAP. Also today with acute Right upper back muscle pain. - No outside BP readings  Plan: 1. Check BP outside of office and record readings, bring to next visit 2. Follow-up BP 1 month for BP re-check, if persistent elevated may need to start anti-HTN therapy

## 2016-06-29 NOTE — Patient Instructions (Signed)
Thank you for coming in to clinic today.  1. You have a  Muscle Spasm with mild muscle strain in your Upper Back / Shoulder muscles around your Scapula or shoulder blade. This will take some time to resolve, it should start feeling better over next few days. Try to avoid re-injury with any heavy lifting or other overactivity. - Do the demonstrated muscle rub technique to help work out the spasm at home, have your husband help - Use heating pad regularly for next few days - Start taking Baclofen (Lioresal) 10mg  (muscle relaxant) - start with half (cut) to one whole pill at night as needed for next 1-3 nights (may make you drowsy, caution with driving) see how it affects you, then if tolerated increase to one pill 2 to 3 times a day or (every 8 hours as needed) - You can continue Aleve 2 pills twice a day with food for about 1 week and then only as needed - Additionally it is safe to also start taking Tylenol Extra Strength 500mg  tabs - take 1 to 2 tabs (max 1000mg  per dose) every 6 hours for pain (take regularly, don't skip a dose for next 3-7 days), max 24 hour daily dose is 6 to 8 tablets or 3000 to 4000mg   Check BP at home, or [facility], and write down readings, bring to next visit.  Please schedule a follow-up appointment with Dr. Althea CharonKaramalegos in 1 month for follow-up BP.  If you have any other questions or concerns, please feel free to call the clinic or send a message through MyChart. You may also schedule an earlier appointment if necessary.  Morgan PilarAlexander Karamalegos, DO [facility]

## 2016-06-29 NOTE — Progress Notes (Signed)
Subjective:    Patient ID: Morgan Mcclain, female    DOB: [birth year], 65 y.o.   MRN: [id number]  Morgan Copaatricia D Demeter is a 65 y.o. female presenting on 06/29/2016 for Back Pain (onset 3 days more on R side shoulder and rediate to neck )  Patient presents for a same day appointment.  HPI   RIGHT UPPER BACK / SCAPULAR MUSCLE PAIN: - Reports no acute injury, but she did recently carry several heavier bags day of onset pain thinks this or sleeping wrong may be contributing, symptoms started 2 days ago when waking up she had soreness and pain in Right upper back / shoulder region. Described as "tight muscle spasm", constant aching muscle spasm pain 6/10, worse severe pain with movement with R arm and shoulder will get sporadic intermittent sharp shooting pain up R side of neck. - Taking Aleve OTC 220mg  x 2 regularly BID, and Ibuprofen 200mg  x 2 for one dose, without any relief - Tried ice pack with mild relief - No similar history of neck, back or shoulder injury. No known history of OA/DJD. No prior surgery or injections on back or joints. - Associated headache due to pain - Denies any low back pain, numbness tingling or weakness in upper extremities. Denies any lower back or lower extremity symptoms  Elevated BP / Pre-HTN - Denies any prior dx of HTN, but has had some borderline elevated BP. No significant family history of HTN either. Never been treated for elevated BP, last visit 05/2016 with borderline BP improved on manual re-check. Does not check BP at home but is willing to do this. - Prior history of OSA previously on CPAP but never continued   Social History  Substance Use Topics  . Smoking status: Never Smoker  . Smokeless tobacco: Never Used  . Alcohol use No    Review of Systems Per HPI unless specifically indicated above     Objective:    BP 140/80 (BP Location: Left Arm, Cuff Size: Normal)   Pulse (!) 50   Temp 97.4 F (36.3 C) (Oral)   Resp 16   Ht 5' 3.5" (1.613 m)    Wt 248 lb (112.5 kg)   BMI 43.24 kg/m   Wt Readings from Last 3 Encounters:  06/29/16 248 lb (112.5 kg)  06/04/16 247 lb (112 kg)  05/18/16 242 lb (109.8 kg)    Physical Exam  Constitutional: She appears well-developed and well-nourished. No distress.  Well-appearing, comfortable, cooperative, obese  HENT:  Head: Normocephalic and atraumatic.  Mouth/Throat: Oropharynx is clear and moist.  Neck: Normal range of motion. Neck supple.  Neck Inspection: Normal appearance. Palpation: Non-tender paraspinal muscles ROM: Slightly reduced Left rotation. Normal flex/extend. Special Testing: Spurling's Maneuver (Negative for radiculopathy bilateral)  Pulmonary/Chest: Effort normal.  Musculoskeletal: She exhibits no edema.  Upper Back / Thoracic / Scapular Region Inspection: Normal appearance, Large body habitus, no spinal deformity, symmetrical. Palpation: Localized muscle tenderness with significant hypertonicity and spasm over Right base of levator scapula, lower trapezius, and deeper scapular muscles compared to Left ROM: Full active ROM R shoulder and thoracic back forward flex/ext Strength: Grip bilaterally 5/5 Neurovascular: intact distal sensation to light touch  Neurological: She is alert.  Skin: Skin is warm and dry. She is not diaphoretic.  Nursing note and vitals reviewed.   I have personally reviewed the following lab results from 06/04/16.  Results for orders placed or performed in visit on 06/04/16  Lipid panel  Result Value Ref Range  Cholesterol 203 (H) 125 - 200 mg/dL   Triglycerides 782105 <956<150 mg/dL   HDL 48 >=21>=46 mg/dL   Total CHOL/HDL Ratio 4.2 <=5.0 Ratio   VLDL 21 <30 mg/dL   LDL Cholesterol 308134 (H) <130 mg/dL  HIV antibody  Result Value Ref Range   HIV 1&2 Ab, 4th Generation NONREACTIVE NONREACTIVE  Hepatitis C antibody  Result Value Ref Range   HCV Ab NEGATIVE NEGATIVE  VITAMIN D 25 Hydroxy (Vit-D Deficiency, Fractures)  Result Value Ref Range   Vit D,  25-Hydroxy 15 (L) 30 - 100 ng/mL  TSH  Result Value Ref Range   TSH 0.91 mIU/L  Hemoglobin A1c  Result Value Ref Range   Hgb A1c MFr Bld 5.7 (H) <5.7 %   Mean Plasma Glucose 117 mg/dL      Assessment & Plan:   Problem List Items Addressed This Visit    Pre-hypertension    Persistent mild borderline elevated Bp today 140/80 on manual re-check. No formal dx HTN. Risk factors with morbid obesity and age. Concern with history of mild OSA not on CPAP. Also today with acute Right upper back muscle pain. - No outside BP readings  Plan: 1. Check BP outside of office and record readings, bring to next visit 2. Follow-up BP 1 month for BP re-check, if persistent elevated may need to start anti-HTN therapy      Muscle strain of right scapular region - Primary    Consistent with Right scapular muscle spasm and assoc trapezius muscle spasm with occasional pains radiating to neck. Possible lifting injury strain. No neurological deficits or weakness.  Plan: 1. Performed OMT today (Right scapular myofascial release, levator scapular myofascial release, and soft tissue trapezius/scapular region) discussed, advised of potential rebound pain, consented, tolerated techniques well with moderate improvement. 2. Continue Aleve 1-2 BID regularly and Tylenol PRN 3. Trial on Baclofen muscle relaxant 5-10mg  TID PRN, caution sedation 4. Recommend regular use heating pad / moist heat, stretching and soft tissue massage techniques as demosntrated, avoid heavy lifting / repetitive activities 5. Follow-up within 4 weeks, may consider trigger point injection in future, may consider chiropractor if recurrent problem       Relevant Medications   baclofen (LIORESAL) 10 MG tablet    Other Visit Diagnoses    Upper back pain on right side       Relevant Medications   baclofen (LIORESAL) 10 MG tablet   Somatic dysfunction of thoracic region          Meds ordered this encounter  Medications  . baclofen  (LIORESAL) 10 MG tablet    Sig: Take 0.5-1 tablets (5-10 mg total) by mouth 3 (three) times daily as needed for muscle spasms.    Dispense:  30 each    Refill:  0    Follow up plan: Return in about 4 weeks (around 07/27/2016) for blood pressure.  Saralyn PilarAlexander Loanne Emery, DO [facility] 06/29/2016, 2:47 PM

## 2016-07-10 ENCOUNTER — Other Ambulatory Visit: Payer: Self-pay | Admitting: Family Medicine

## 2016-07-10 DIAGNOSIS — Z1231 Encounter for screening mammogram for malignant neoplasm of breast: Secondary | ICD-10-CM

## 2016-08-16 ENCOUNTER — Ambulatory Visit: Payer: Medicare Other

## 2016-08-23 ENCOUNTER — Ambulatory Visit
Admission: RE | Admit: 2016-08-23 | Discharge: 2016-08-23 | Disposition: A | Discharge status: Home / Self Care | Payer: Medicare Other | Source: Ambulatory Visit | Attending: Family Medicine | Admitting: Family Medicine

## 2016-08-23 DIAGNOSIS — Z1231 Encounter for screening mammogram for malignant neoplasm of breast: Secondary | ICD-10-CM | POA: Insufficient documentation

## 2016-10-15 ENCOUNTER — Ambulatory Visit
Admission: RE | Admit: 2016-10-15 | Discharge: 2016-10-15 | Disposition: A | Discharge status: Home / Self Care | Payer: Medicare Other | Source: Ambulatory Visit | Attending: Family Medicine | Admitting: Family Medicine

## 2016-10-15 ENCOUNTER — Ambulatory Visit (INDEPENDENT_AMBULATORY_CARE_PROVIDER_SITE_OTHER): Payer: Medicare Other | Admitting: Family Medicine

## 2016-10-15 ENCOUNTER — Encounter: Payer: Self-pay | Admitting: Family Medicine

## 2016-10-15 VITALS — BP 140/84 | HR 56 | Temp 98.1°F | Resp 16 | Ht 63.5 in | Wt 249.0 lb

## 2016-10-15 DIAGNOSIS — G8929 Other chronic pain: Secondary | ICD-10-CM | POA: Diagnosis present

## 2016-10-15 DIAGNOSIS — M25572 Pain in left ankle and joints of left foot: Secondary | ICD-10-CM

## 2016-10-15 DIAGNOSIS — E785 Hyperlipidemia, unspecified: Secondary | ICD-10-CM | POA: Insufficient documentation

## 2016-10-15 DIAGNOSIS — M7989 Other specified soft tissue disorders: Secondary | ICD-10-CM | POA: Diagnosis not present

## 2016-10-15 DIAGNOSIS — M25562 Pain in left knee: Secondary | ICD-10-CM | POA: Diagnosis present

## 2016-10-15 DIAGNOSIS — L409 Psoriasis, unspecified: Secondary | ICD-10-CM

## 2016-10-15 DIAGNOSIS — R7303 Prediabetes: Secondary | ICD-10-CM

## 2016-10-15 DIAGNOSIS — M170 Bilateral primary osteoarthritis of knee: Secondary | ICD-10-CM | POA: Diagnosis not present

## 2016-10-15 DIAGNOSIS — R600 Localized edema: Secondary | ICD-10-CM

## 2016-10-15 DIAGNOSIS — E559 Vitamin D deficiency, unspecified: Secondary | ICD-10-CM

## 2016-10-15 DIAGNOSIS — R030 Elevated blood-pressure reading, without diagnosis of hypertension: Secondary | ICD-10-CM | POA: Diagnosis not present

## 2016-10-15 DIAGNOSIS — Z6841 Body Mass Index (BMI) 40.0 and over, adult: Secondary | ICD-10-CM | POA: Diagnosis not present

## 2016-10-15 LAB — HEMOGLOBIN A1C
HEMOGLOBIN A1C: 5.7 % — AB (ref <5.7)
MEAN PLASMA GLUCOSE: 117 mg/dL

## 2016-10-15 MED ORDER — MELOXICAM 15 MG PO TABS: 15.0000 mg | ORAL_TABLET | Freq: Every day | ORAL | 2 refills | Status: DC

## 2016-10-15 NOTE — Assessment & Plan Note (Signed)
Still elevated BP, on manual re-check. No formal dx HTN. Risk factors with morbid obesity and age. Concern with history of mild OSA not on CPAP. Also still with acute MSK pain - No outside BP readings, did not adhere to request  Plan: 1. Discussion today on progression of HTN. Advised again to check BP outside of office and record readings, bring to next visit 2. Return sooner if elevated readings >140/90, will likely need to start anti-HTN therapy within next visit or near future if not able to lose weight and change other lifestyle factors

## 2016-10-15 NOTE — Patient Instructions (Signed)
Thank you for coming in to clinic today.  1. Checking A1c today, last 5.7, this is low end of Pre-Diabetes - Checking Vitamin D, last was 15, if still low, most likely I would recommend taking Vitamin D3 5,000 unit capsule daily for 8-12 weeks if still low, then once finished can take a Vitamin D3 2,000 unit daily for maintenance  2. Start checking BP regularly at home, sometimes in morning, others in afternoon / evening, avoid times of significant stress or pain. Write down readings. If persistently >140/90, then notify office  3. X-rays today knee and ankle, will determine if arthritis or other joint problem.  I am concerned that you most likely have Psoriatic Arthritis, please discuss this further with your Dermatologist for management of Psoriasis. And next step may be future referral to Rheumatology or Orthopedics  4. Try to keep up the good work with your plan for exercise, weight loss, and diet  The 5 Minute Rule of Exercise - Promise yourself to at least do 5 minutes of exercise (make sure you time it), and if at the end of 5 minutes (this is the hardest part of the work-out), if you still feel like you want stop (or not motivated to continue) then allow yourself to stop. Otherwise, more often than not you will feel encouraged that you can continue for a little while longer or even more!  Diet Recommendations for Pre-Diabetes   Limit Starchy (carb) foods include: Bread, rice, pasta, potatoes, corn, crackers, bagels, muffins, all baked goods.   Keep eating Protein foods include: Meat, fish, poultry, eggs, dairy foods, and beans such as pinto and kidney beans (beans also provide carbohydrate).   1. Eat at least 3 meals and 1-2 snacks per day. Never go more than 4-5 hours while awake without eating.   2. Limit starchy foods to TWO per meal and ONE per snack. ONE portion of a starchy  food is equal to the following:   - ONE slice of bread (or its equivalent, such as half of a hamburger  bun).   - 1/2 cup of a "scoopable" starchy food such as potatoes or rice.   - 1 OUNCE (28 grams) of starchy snacks (crackers or pretzels, look on label).   - 15 grams of carbohydrate as shown on food label.   3. Both lunch and dinner should include a protein food, a carb food, and vegetables.   - Obtain twice as many veg's as protein or carbohydrate foods for both lunch and dinner.   - Try to keep frozen veg's on hand for a quick vegetable serving.     - Fresh or frozen veg's are best.   4. Breakfast should always include protein.     Please schedule a follow-up appointment with Dr. Althea CharonKaramalegos in 3 months for follow-up HTN, Pre-DM Weight, Knee Pain  If you have any other questions or concerns, please feel free to call the clinic or send a message through MyChart. You may also schedule an earlier appointment if necessary.  Saralyn PilarAlexander Yasmina Chico, DO [facility]

## 2016-10-15 NOTE — Assessment & Plan Note (Signed)
Stable chronic problem - Most likely related to weight and chronic venous stasis - Continue conservative therapy, RICE - Manage underlying arthritis, improve activity, in future consider ECHO if needed
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
## 2016-10-15 NOTE — Assessment & Plan Note (Addendum)
Check A1c today, last 5.7 Reviewed lifestyle, diet, exercise recommendations Follow-up q 3-6 months for Pre-DM trend

## 2016-10-15 NOTE — Assessment & Plan Note (Signed)
Last vit D 15. Non adherent to full treatment course VIt D supplement Check Vitamin D today, follow-up result

## 2016-10-15 NOTE — Assessment & Plan Note (Signed)
Chronic problem with some recent worsening flares, also concern for possible underlying psoriatic arthritis with knee DJD new diagnosis. - Follow-up as scheduled with [facility] in 1-2 months - Future consider Rheumatology vs Ortho for psoriatic arthritis if indicated

## 2016-10-15 NOTE — Assessment & Plan Note (Signed)
Subacute on chronic L genearlized Knee pain and swelling (lower leg swelling) without known injury or trauma. No known knee OA/DJD. Suspected likely due to underlying osteoarthritis / DJD in with morbid obesity, typical history, and psoriasis concern for psoriatic arthritis component. - Less concern for meniscus or ligamentous injury - Able to bear weight (but some intermittent problems with ambulation due to pain), no knee instability, mechanical locking - No prior history of knee surgery, arthroscopy - Inadequate conservative therapy   Plan: 1. Check X-rays today Knee bilateral AP standing and L knee series, also with L ankle x-rays - Reviewed results of images with moderate DJD L>R knees, osteophytes, narrowing, results released to mychart and patient notified by phone - Start anti-inflammatory trial with rx Meloxicam 15mg  daily for now 2. Start Tylenol 500-1000mg  per dose TID PRN breakthrough - Consider refill Baclofen PRN 3. RICE therapy (rest, ice, compression, elevation) for swelling, activity modification 4. Follow-up 4-6 weeks as needed otherwise 3 months as planned, allow patient to return to Dermatology to discuss possibility of psoriatic arthritis, f still worsening, consider steroid injection and referral to Ortho vs Rheumatology

## 2016-10-15 NOTE — Assessment & Plan Note (Signed)
Stable weight, without improvement or weight loss. Concern with elevated BP, likely inc risk progression to HTN. Concern for future worsening OSA as well, however negative screening history now. - Pre-DM, HLD  Plan: 1. Discussion on dramatic lifestyle modification improvement with diet and exercise, return to gym, however likely limited by LLE pain, will treat for arthritis, check x-rays, follow-up

## 2016-10-15 NOTE — Assessment & Plan Note (Signed)
Secondary to left sided lower ext chronic pain >1-2 years, likely DJD/OA with L knee pain, possibly psoriatic arthritis, some pain likely due to chronic swelling. - Check L ankle x-rays (reviewed results no acute findings other than some edema) - See arthritis A&P

## 2016-10-16 LAB — VITAMIN D 25 HYDROXY (VIT D DEFICIENCY, FRACTURES): VIT D 25 HYDROXY: 17 ng/mL — AB (ref 30–100)

## 2017-01-15 ENCOUNTER — Ambulatory Visit: Payer: Medicare Other | Admitting: Family Medicine

## 2017-01-29 ENCOUNTER — Ambulatory Visit (INDEPENDENT_AMBULATORY_CARE_PROVIDER_SITE_OTHER): Payer: Medicare Other | Admitting: Family Medicine

## 2017-01-29 ENCOUNTER — Encounter: Payer: Self-pay | Admitting: Family Medicine

## 2017-01-29 VITALS — BP 134/74 | HR 52 | Temp 97.8°F | Resp 16 | Ht 63.5 in | Wt 252.0 lb

## 2017-01-29 DIAGNOSIS — E559 Vitamin D deficiency, unspecified: Secondary | ICD-10-CM

## 2017-01-29 DIAGNOSIS — M170 Bilateral primary osteoarthritis of knee: Secondary | ICD-10-CM

## 2017-01-29 DIAGNOSIS — Z6841 Body Mass Index (BMI) 40.0 and over, adult: Secondary | ICD-10-CM

## 2017-01-29 DIAGNOSIS — R030 Elevated blood-pressure reading, without diagnosis of hypertension: Secondary | ICD-10-CM | POA: Diagnosis not present

## 2017-01-29 DIAGNOSIS — L4050 Arthropathic psoriasis, unspecified: Secondary | ICD-10-CM

## 2017-01-29 MED ORDER — BACLOFEN 10 MG PO TABS: 5.0000 mg | ORAL_TABLET | Freq: Three times a day (TID) | ORAL | 2 refills | Status: DC | PRN

## 2017-01-29 NOTE — Patient Instructions (Addendum)
Thank you for coming to the clinic today.  1. Back Pain / Knee Pain or other musculoskeletal pains (from arthritis, other causes, or even just being more active with exercise)  FIRST medication  Recommend to start taking Tylenol Extra Strength 500mg  tabs - take 1 to 2 tabs per dose (max 1000mg ) every 6-8 hours for pain (take regularly, don't skip a dose for next 7 days), max 24 hour daily dose is 6 tablets or 3000mg . In the future you can repeat the same everyday Tylenol course for 1-2 weeks at a time.   SECOND medication  If needed you may start taking Baclofen (Lioresal) 10mg  (muscle relaxant) - start with half (cut) to one whole pill at night as needed for next 1-3 nights (may make you drowsy, caution with driving) see how it affects you, then if tolerated increase to one pill 2 to 3 times a day or (every 8 hours as needed)  THIRD medication  If need stronger anti-inflammatory - take Meloxicam 15mg  tablet - once daily with food for up to 1-2 weeks at a time, then STOP, do not take regularly or longer than 2 weeks. - Do not mix with Ibuprofen, Advil, Aleve, Naproxen  2. Keep checking the blood pressure as you are.  If you are able to work on improving regular exercise, walking, and some moderate weight loss, then I think BP will be better controlled  Try to work on improving diet, increased water and less sodas.  Common blood pressure medicines:  1. Amlodipine / Norvasc (Calcium channel blocker) - 5-10mg  daily - potential side effect is swelling in lower extremities  2. Lisinopril or Losartan - mild fluid pill, good to protect kidneys (good for diabetic patients), rare side effect would be a dry cough, or very rarely angioedema (facial or lip swelling)  3. Hydrochlorothiazide (HCTZ) - fluid pill, once daily, can help reduce swelling, can change sodium/potassium, may strain kidneys certain circumstances  ------------- Vitamin D - Make sure you are taking daily supplement - Vitamin D3  5,000 unit daily for next 3 months (then switch to Vitamin D3 2,000 unit daily)  You will be due for FASTING BLOOD WORK (no food or drink after midnight before, only water or coffee without cream/sugar on the morning of)  - Please go ahead and schedule a "Lab Only" visit in the morning at the clinic for lab draw in 5 months  Please schedule a Follow-up Appointment to: Return in about 5 months (around 07/01/2017) for Annual Physical.  If you have any other questions or concerns, please feel free to call the clinic or send a message through MyChart. You may also schedule an earlier appointment if necessary.  Saralyn PilarAlexander Jessikah Dicker, DO [facility]
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
## 2017-01-30 ENCOUNTER — Other Ambulatory Visit: Payer: Self-pay | Admitting: Family Medicine

## 2017-01-30 DIAGNOSIS — Z0000 Encounter for general adult medical examination without abnormal findings: Secondary | ICD-10-CM

## 2017-01-30 DIAGNOSIS — Z6841 Body Mass Index (BMI) 40.0 and over, adult: Secondary | ICD-10-CM

## 2017-01-30 DIAGNOSIS — R030 Elevated blood-pressure reading, without diagnosis of hypertension: Secondary | ICD-10-CM

## 2017-01-30 DIAGNOSIS — E559 Vitamin D deficiency, unspecified: Secondary | ICD-10-CM

## 2017-01-30 DIAGNOSIS — E782 Mixed hyperlipidemia: Secondary | ICD-10-CM

## 2017-01-30 DIAGNOSIS — R7303 Prediabetes: Secondary | ICD-10-CM
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
## 2017-01-30 NOTE — Assessment & Plan Note (Signed)
Mild weight gain. Concern with Pre-HTN, likely inc risk progression to HTN. Concern for future worsening OSA as well, however negative screening history now. - Pre-DM, HLD  Plan: 1. Again - discussion on dramatic lifestyle modification improvement with diet and exercise, return to gym, however likely limited by LE pain - should be improving with management of psoriatic arthritis by Derm 2. Emphasized importance of lifestyle changes diet, low sodium, significantly reduce soda intake inc water, smaller portions 3. Follow-up 5 months Annual Physical - labs, consider referral to Dr [facility] if needed, or other wt loss options

## 2017-01-30 NOTE — Assessment & Plan Note (Signed)
Last Vit D still low at 17 Reviewed plan to continue Vitamin D3 5,000 unit daily for up to 3 months or more, had been non adherent, then switch to Vitamin D3 2,000 u daily for maintenance
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
## 2017-03-12 ENCOUNTER — Telehealth: Payer: Self-pay | Admitting: Family Medicine

## 2017-03-12 NOTE — Telephone Encounter (Signed)
Called pt to schedule Annual Wellness Visit with NHA 9:15AM 11/13  - knb

## 2017-07-09 ENCOUNTER — Other Ambulatory Visit: Payer: Medicare Other

## 2017-07-10 ENCOUNTER — Telehealth: Payer: Self-pay | Admitting: Family Medicine

## 2017-07-10 NOTE — Telephone Encounter (Signed)
Called pt to sched for AWV with Nurse Health Advisor.  C/b #  [phone] on Skype [email or web address] if you have questions ° °

## 2017-07-15 ENCOUNTER — Encounter: Payer: Medicare Other | Admitting: Family Medicine

## 2017-07-23 ENCOUNTER — Encounter: Payer: Medicare Other | Admitting: Family Medicine

## 2017-07-25 ENCOUNTER — Other Ambulatory Visit: Payer: Medicare Other

## 2017-07-25 DIAGNOSIS — R7303 Prediabetes: Secondary | ICD-10-CM

## 2017-07-25 DIAGNOSIS — E559 Vitamin D deficiency, unspecified: Secondary | ICD-10-CM

## 2017-07-25 DIAGNOSIS — E782 Mixed hyperlipidemia: Secondary | ICD-10-CM

## 2017-07-25 DIAGNOSIS — Z6841 Body Mass Index (BMI) 40.0 and over, adult: Secondary | ICD-10-CM

## 2017-07-25 DIAGNOSIS — Z0000 Encounter for general adult medical examination without abnormal findings: Secondary | ICD-10-CM

## 2017-07-25 LAB — LIPID PANEL
CHOL/HDL RATIO: 4.1 (calc) (ref <5.0)
CHOLESTEROL: 207 mg/dL — AB (ref <200)
HDL: 50 mg/dL — AB (ref >50)
LDL CHOLESTEROL (CALC): 137 mg/dL — AB
Non-HDL Cholesterol (Calc): 157 mg/dL (calc) — ABNORMAL HIGH (ref <130)
Triglycerides: 94 mg/dL (ref <150)

## 2017-07-25 LAB — COMPLETE METABOLIC PANEL WITH GFR
AG RATIO: 1 (calc) (ref 1.0–2.5)
ALKALINE PHOSPHATASE (APISO): 102 U/L (ref 33–130)
ALT: 9 U/L (ref 6–29)
AST: 15 U/L (ref 10–35)
Albumin: 3.9 g/dL (ref 3.6–5.1)
BUN: 12 mg/dL (ref 7–25)
CALCIUM: 9.3 mg/dL (ref 8.6–10.4)
CO2: 27 mmol/L (ref 20–32)
Chloride: 105 mmol/L (ref 98–110)
Creat: 0.68 mg/dL (ref 0.50–0.99)
GFR, EST NON AFRICAN AMERICAN: 91 mL/min/{1.73_m2} (ref >=60)
GFR, Est African American: 106 mL/min/{1.73_m2} (ref >=60)
GLOBULIN: 3.9 g/dL — AB (ref 1.9–3.7)
Glucose, Bld: 89 mg/dL (ref 65–99)
POTASSIUM: 4.2 mmol/L (ref 3.5–5.3)
SODIUM: 139 mmol/L (ref 135–146)
Total Bilirubin: 0.5 mg/dL (ref 0.2–1.2)
Total Protein: 7.8 g/dL (ref 6.1–8.1)

## 2017-07-25 LAB — CBC WITH DIFFERENTIAL/PLATELET
BASOS PCT: 0.6 %
Basophils Absolute: 38 cells/uL (ref 0–200)
EOS PCT: 2.4 %
Eosinophils Absolute: 151 cells/uL (ref 15–500)
HCT: 41.2 % (ref 35.0–45.0)
HEMOGLOBIN: 14.2 g/dL (ref 11.7–15.5)
Lymphs Abs: 1569 cells/uL (ref 850–3900)
MCH: 31.5 pg (ref 27.0–33.0)
MCHC: 34.5 g/dL (ref 32.0–36.0)
MCV: 91.4 fL (ref 80.0–100.0)
MONOS PCT: 10.4 %
MPV: 11.2 fL (ref 7.5–12.5)
NEUTROS ABS: 3887 {cells}/uL (ref 1500–7800)
Neutrophils Relative %: 61.7 %
Platelets: 181 10*3/uL (ref 140–400)
RBC: 4.51 10*6/uL (ref 3.80–5.10)
RDW: 12.3 % (ref 11.0–15.0)
Total Lymphocyte: 24.9 %
WBC mixed population: 655 cells/uL (ref 200–950)
WBC: 6.3 10*3/uL (ref 3.8–10.8)

## 2017-07-25 LAB — VITAMIN D 25 HYDROXY (VIT D DEFICIENCY, FRACTURES): VIT D 25 HYDROXY: 17 ng/mL — AB (ref 30–100)

## 2017-07-25 LAB — HEMOGLOBIN A1C
HEMOGLOBIN A1C: 5.8 %{Hb} — AB (ref <5.7)
Mean Plasma Glucose: 120 (calc)
eAG (mmol/L): 6.6 (calc)

## 2017-07-30 ENCOUNTER — Ambulatory Visit (INDEPENDENT_AMBULATORY_CARE_PROVIDER_SITE_OTHER): Payer: Medicare Other

## 2017-07-30 VITALS — BP 150/86 | HR 66 | Temp 97.3°F | Resp 16 | Ht 64.0 in | Wt 251.4 lb

## 2017-07-30 DIAGNOSIS — Z0000 Encounter for general adult medical examination without abnormal findings: Secondary | ICD-10-CM

## 2017-07-30 DIAGNOSIS — Z1231 Encounter for screening mammogram for malignant neoplasm of breast: Secondary | ICD-10-CM | POA: Diagnosis not present

## 2017-07-30 DIAGNOSIS — Z1239 Encounter for other screening for malignant neoplasm of breast: Secondary | ICD-10-CM

## 2017-07-30 NOTE — Progress Notes (Signed)
Subjective:   Morgan Mcclain is a 66 y.o. female who presents for an Initial Medicare Annual Wellness Visit.  Review of Systems      Cardiac Risk Factors include: hypertension;obesity (BMI >30kg/m2);dyslipidemia;advanced age (>107men, >8 women)     Objective:    Today's Vitals   07/30/17 0915  BP: (!) 150/86  Pulse: 66  Resp: 16  Temp: (!) 97.3 F (36.3 C)  TempSrc: Oral  Weight: 251 lb 6.4 oz (114 kg)  Height: 5\' 4"  (1.626 m)   Body mass index is 43.15 kg/m.  Advanced Directives 07/30/2017 05/18/2016 12/25/2012  Does Patient Have a Medical Advance Directive? Yes No Patient does not have advance directive;Patient would not like information  Type of Public librarian Power of [place];Living will - -  Copy of Healthcare Power of Attorney in Chart? No - copy requested - -  Would patient like information on creating a medical advance directive? - Yes - Educational materials given -  Pre-existing out of facility DNR order (yellow form or pink MOST form) - - No    Current Medications (verified) Outpatient Encounter Medications as of 07/30/2017  Medication Sig  . baclofen (LIORESAL) 10 MG tablet Take 0.5-1 tablets (5-10 mg total) by mouth 3 (three) times daily as needed for muscle spasms.  . meloxicam (MOBIC) 15 MG tablet Take 1 tablet (15 mg total) by mouth daily. With food. Do not take Ibuprofen, Advil, Aleve on this med.  . Cholecalciferol (VITAMIN D3) 5000 units CAPS Take 1 capsule (5,000 Units total) by mouth daily. For 8 weeks, then start Vitamin D3 2,000 units daily (OTC) (Patient not taking: Reported on 07/30/2017)  . Ixekizumab (TALTZ) 80 MG/ML SOAJ Inject into the skin. Every other week for 12 weeks, then once monthly for several months, per Dermatology (Dr Roseanne Reno, [facility])   No facility-administered encounter medications on file as of 07/30/2017.     Allergies (verified) Lamisil [terbinafine] and Shellfish allergy   History: Past  Medical History:  Diagnosis Date  . Arthritis   . Left ureteral calculus   . Psoriasis SEVERE--  RECEIVES LASE TX'S TWICE WEEKLY  . Renal calculus, left   . Sleep apnea 02/23/2015   No longer on CPAP   Past Surgical History:  Procedure Laterality Date  . ABDOMINAL HYSTERECTOMY  1994   W/ UNILATERAL SALPINGOOPHORECTOMY  . COLONOSCOPY WITH PROPOFOL N/A 03/04/2015   Procedure: COLONOSCOPY WITH PROPOFOL;  Surgeon: Midge Minium, MD;  [facility];  Service: Endoscopy;  Laterality: N/A;  . CYSTOSCOPY WITH RETROGRADE PYELOGRAM, URETEROSCOPY AND STENT PLACEMENT Left 12/25/2012   Procedure: CYSTOSCOPY WITH RETROGRADE PYELOGRAM, URETEROSCOPY AND STENT PLACEMENT;  Surgeon: Sebastian Ache, MD;  [facility];  Service: Urology;  Laterality: Left;  90 MIN NEEDS DIG URETEROSCOPE   . HOLMIUM LASER APPLICATION Left 12/25/2012   Procedure: HOLMIUM LASER APPLICATION;  Surgeon: Sebastian Ache, MD;  Location[facility];  Service: Urology;  Laterality: Left;  . TONSILLECTOMY  AGE 42   Family History  Problem Relation Age of Onset  . Heart failure Mother   . Arthritis Mother   . Heart attack Mother 74       Secondary CVA  . Cancer Father        lung cancer  . Heart disease Sister 78  . Breast cancer Neg Hx    Social History   Socioeconomic History  . Marital status: Married    Spouse name: None  . Number of  children: None  . Years of education: Administrative Degree 6 yr  . Highest education level: None  Social Needs  . Financial resource strain: Not very hard  . Food insecurity - worry: Never true  . Food insecurity - inability: Never true  . Transportation needs - medical: No  . Transportation needs - non-medical: No  Occupational History  . Occupation: Retired Geologist, engineering- Educator  Tobacco Use  . Smoking status: Never Smoker  . Smokeless tobacco: Never Used  Substance and Sexual Activity  . Alcohol use: No  . Drug use: No  . Sexual activity:  None  Other Topics Concern  . None  Social History Narrative  . None    Tobacco Counseling Counseling given: Not Answered   Clinical Intake:  Pre-visit preparation completed: Yes  Pain : No/denies pain     Nutritional Status: BMI > 30  Obese Nutritional Risks: None Diabetes: No  Activities of Daily Living: Independent Ambulation: Independent with device- listed below Home Assistive Devices/Equipment: Corliss BlackerCane, Eyeglasses Medication Administration: Independent Home Management: Independent  Barriers to Care Management & Learning: None  Do you feel unsafe in your current relationship?: No(married) Do you feel physically threatened by others?: No Anyone hurting you at home, work, or school?: No Unable to ask?: No  How often do you need to have someone help you when you read instructions, pamphlets, or other written materials from your doctor or pharmacy?: 1 - Never What is the last grade level you completed in school?: advanced degree  Interpreter Needed?: No  Information entered by :: Tiffany Hill,LPN   Activities of Daily Living In your present state of health, do you have any difficulty performing the following activities: 07/30/2017  Hearing? N  Vision? N  Difficulty concentrating or making decisions? Y  Walking or climbing stairs? Y  Dressing or bathing? N  Doing errands, shopping? N  Preparing Food and eating ? N  Using the Toilet? N  In the past six months, have you accidently leaked urine? Y  Comment wears protections  Do you have problems with loss of bowel control? N  Managing your Medications? N  Managing your Finances? N  Housekeeping or managing your Housekeeping? N  Some recent data might be hidden    Timed Get Up and Go Performed : 8 seconds with no assistive devices.   Immunizations and Health Maintenance Immunization History  Administered Date(s) Administered  . Influenza-Unspecified 04/27/2016, 07/27/2017  . Tdap 08/27/2009  . Zoster  08/27/2013   Health Maintenance Due  Topic Date Due  . PNA vac Low Risk Adult (2 of 2 - PPSV23) 04/04/2017    Patient Care Team: Smitty CordsKaramalegos, Alexander J, DO as PCP - General (Family Medicine) Willeen NieceStewart, Tara, MD (Dermatology)  Indicate any recent Medical Services you may have received from other than [facility] providers in the past year (date may be approximate).     Assessment:   This is a routine wellness examination for Morgan Mcclain.   Hearing/Vision screen Vision Screening Comments: Doesn't have an eye doctor in the area, will give recommendations   Dietary issues and exercise activities discussed: Current Exercise Habits: The patient does not participate in regular exercise at present, Exercise limited by: None identified  Goals    . DIET - INCREASE WATER INTAKE     Recommend drinking at least 6-8 glasses of water a day       Depression Screen PHQ 2/9 Scores 07/30/2017 06/04/2016  PHQ - 2 Score 0 0  PHQ- 9 Score 3 -  Fall Risk Fall Risk  07/30/2017 06/04/2016  Falls in the past year? No No    Is the patient's home free of loose throw rugs in walkways, pet beds, electrical cords, etc?   yes      Grab bars in the bathroom? yes      Handrails on the stairs?   yes      Adequate lighting?   yes  Cognitive Function:     6CIT Screen 07/30/2017  What Year? 0 points  What month? 0 points  What time? 0 points  Count back from 20 0 points  Months in reverse 0 points  Repeat phrase 2 points  Total Score 2    Screening Tests Health Maintenance  Topic Date Due  . PNA vac Low Risk Adult (2 of 2 - PPSV23) 04/04/2017  . TETANUS/TDAP  06/01/2026 (Originally 08/27/2021)  . MAMMOGRAM  08/23/2018  . COLONOSCOPY  03/03/2025  . INFLUENZA VACCINE  Completed  . DEXA SCAN  Completed  . Hepatitis C Screening  Completed    Cancer Screenings: Lung: Low Dose CT Chest recommended if Age 66-80 years, 30 pack-year currently smoking OR have quit w/in 15years. Patient does not  qualify. Breast: Up to date on Mammogram? Yes  Up to date of Bone Density/Dexa? Yes Colorectal: completed  03/04/2015  Additional Screenings:  Hepatitis B/HIV/Syphillis: HIV completed 05/2016, Hep B and Syphillis not indicated  Hepatitis C Screening: completed 05/2016     Plan:    I have personally reviewed and addressed the Medicare Annual Wellness questionnaire and have noted the following in the patient's chart:  A. Medical and social history B. Use of alcohol, tobacco or illicit drugs  C. Current medications and supplements D. Functional ability and status E.  Nutritional status F.  Physical activity G. Advance directives H. List of other physicians I.  Hospitalizations, surgeries, and ER visits in previous 12 months J.  Vitals K. Screenings such as hearing and vision if needed, cognitive and depression L. Referrals and appointments   In addition, I have reviewed and discussed with patient certain preventive protocols, quality metrics, and best practice recommendations. A written personalized care plan for preventive services as well as general preventive health recommendations were provided to patient.   Signed,  Marin Robertsiffany Hill, LPN Nurse Health Advisor   Nurse Notes:  [facility] for verification on pneumonia vaccines.

## 2017-07-30 NOTE — Patient Instructions (Addendum)
[name] , Thank you for taking time to come for your Medicare Wellness Visit. I appreciate your ongoing commitment to your health goals. Please review the following plan we discussed and let me know if I can assist you in the future.   Screening recommendations/referrals: Colonoscopy: completed 03/04/2015 Mammogram: completed 08/23/2016, Please call [phone] to schedule your mammogram.  Bone Density: completed 08/26/2013 Recommended yearly ophthalmology/optometry visit for glaucoma screening and checkup Recommended yearly dental visit for hygiene and checkup  Vaccinations: Influenza vaccine: done  Pneumococcal vaccine: due now, please check with [facility] for your immunization records Tdap vaccine: up to date Shingles vaccine: up to date  Advanced directives: Please bring a copy of your health care power of attorney and living will to the office at your convenience.  Conditions/risks identified: Recommend drinking at least 6-8 glasses of water a day   Next appointment: Follow up on 07/31/2017 at 9:00am with Dr.Karamalegos. Follow up in one year for your annual wellness exam.   Preventive Care 65 Years and Older, Female Preventive care refers to lifestyle choices and visits with your health care provider that can promote health and wellness. What does preventive care include?  A yearly physical exam. This is also called an annual well check.  Dental exams once or twice a year.  Routine eye exams. Ask your health care provider how often you should have your eyes checked.  Personal lifestyle choices, including:  Daily care of your teeth and gums.  Regular physical activity.  Eating a healthy diet.  Avoiding tobacco and drug use.  Limiting alcohol use.  Practicing safe sex.  Taking low-dose aspirin every day.  Taking vitamin and mineral supplements as recommended by your health care provider. What happens during an annual well check? The services and screenings done  by your health care provider during your annual well check will depend on your age, overall health, lifestyle risk factors, and family history of disease. Counseling  Your health care provider may ask you questions about your:  Alcohol use.  Tobacco use.  Drug use.  Emotional well-being.  Home and relationship well-being.  Sexual activity.  Eating habits.  History of falls.  Memory and ability to understand (cognition).  Work and work Astronomerenvironment.  Reproductive health. Screening  You may have the following tests or measurements:  Height, weight, and BMI.  Blood pressure.  Lipid and cholesterol levels. These may be checked every 5 years, or more frequently if you are over 579 years old.  Skin check.  Lung cancer screening. You may have this screening every year starting at age 66 if you have a 30-pack-year history of smoking and currently smoke or have quit within the past 15 years.  Fecal occult blood test (FOBT) of the stool. You may have this test every year starting at age 66.  Flexible sigmoidoscopy or colonoscopy. You may have a sigmoidoscopy every 5 years or a colonoscopy every 10 years starting at age 66.  Hepatitis C blood test.  Hepatitis B blood test.  Sexually transmitted disease (STD) testing.  Diabetes screening. This is done by checking your blood sugar (glucose) after you have not eaten for a while (fasting). You may have this done every 1-3 years.  Bone density scan. This is done to screen for osteoporosis. You may have this done starting at age 66.  Mammogram. This may be done every 1-2 years. Talk to your health care provider about how often you should have regular mammograms. Talk with your health care provider  about your test results, treatment options, and if necessary, the need for more tests. Vaccines  Your health care provider may recommend certain vaccines, such as:  Influenza vaccine. This is recommended every year.  Tetanus,  diphtheria, and acellular pertussis (Tdap, Td) vaccine. You may need a Td booster every 10 years.  Zoster vaccine. You may need this after age 9.  Pneumococcal 13-valent conjugate (PCV13) vaccine. One dose is recommended after age 104.  Pneumococcal polysaccharide (PPSV23) vaccine. One dose is recommended after age 12. Talk to your health care provider about which screenings and vaccines you need and how often you need them. This information is not intended to replace advice given to you by your health care provider. Make sure you discuss any questions you have with your health care provider. Document Released: 09/09/2015 Document Revised: 05/02/2016 Document Reviewed: 06/14/2015 Elsevier Interactive Patient Education  2017 [place] Prevention in the Home Falls can cause injuries. They can happen to people of all ages. There are many things you can do to make your home safe and to help prevent falls. What can I do on the outside of my home?  Regularly fix the edges of walkways and driveways and fix any cracks.  Remove anything that might make you trip as you walk through a door, such as a raised step or threshold.  Trim any bushes or trees on the path to your home.  Use bright outdoor lighting.  Clear any walking paths of anything that might make someone trip, such as rocks or tools.  Regularly check to see if handrails are loose or broken. Make sure that both sides of any steps have handrails.  Any raised decks and porches should have guardrails on the edges.  Have any leaves, snow, or ice cleared regularly.  Use sand or salt on walking paths during winter.  Clean up any spills in your garage right away. This includes oil or grease spills. What can I do in the bathroom?  Use night lights.  Install grab bars by the toilet and in the tub and shower. Do not use towel bars as grab bars.  Use non-skid mats or decals in the tub or shower.  If you need to sit down in  the shower, use a plastic, non-slip stool.  Keep the floor dry. Clean up any water that spills on the floor as soon as it happens.  Remove soap buildup in the tub or shower regularly.  Attach bath mats securely with double-sided non-slip rug tape.  Do not have throw rugs and other things on the floor that can make you trip. What can I do in the bedroom?  Use night lights.  Make sure that you have a light by your bed that is easy to reach.  Do not use any sheets or blankets that are too big for your bed. They should not hang down onto the floor.  Have a firm chair that has side arms. You can use this for support while you get dressed.  Do not have throw rugs and other things on the floor that can make you trip. What can I do in the kitchen?  Clean up any spills right away.  Avoid walking on wet floors.  Keep items that you use a lot in easy-to-reach places.  If you need to reach something above you, use a strong step stool that has a grab bar.  Keep electrical cords out of the way.  Do not use floor polish or  wax that makes floors slippery. If you must use wax, use non-skid floor wax.  Do not have throw rugs and other things on the floor that can make you trip. What can I do with my stairs?  Do not leave any items on the stairs.  Make sure that there are handrails on both sides of the stairs and use them. Fix handrails that are broken or loose. Make sure that handrails are as long as the stairways.  Check any carpeting to make sure that it is firmly attached to the stairs. Fix any carpet that is loose or worn.  Avoid having throw rugs at the top or bottom of the stairs. If you do have throw rugs, attach them to the floor with carpet tape.  Make sure that you have a light switch at the top of the stairs and the bottom of the stairs. If you do not have them, ask someone to add them for you. What else can I do to help prevent falls?  Wear shoes that:  Do not have high  heels.  Have rubber bottoms.  Are comfortable and fit you well.  Are closed at the toe. Do not wear sandals.  If you use a stepladder:  Make sure that it is fully opened. Do not climb a closed stepladder.  Make sure that both sides of the stepladder are locked into place.  Ask someone to hold it for you, if possible.  Clearly mark and make sure that you can see:  Any grab bars or handrails.  First and last steps.  Where the edge of each step is.  Use tools that help you move around (mobility aids) if they are needed. These include:  Canes.  Walkers.  Scooters.  Crutches.  Turn on the lights when you go into a dark area. Replace any light bulbs as soon as they burn out.  Set up your furniture so you have a clear path. Avoid moving your furniture around.  If any of your floors are uneven, fix them.  If there are any pets around you, be aware of where they are.  Review your medicines with your doctor. Some medicines can make you feel dizzy. This can increase your chance of falling. Ask your doctor what other things that you can do to help prevent falls. This information is not intended to replace advice given to you by your health care provider. Make sure you discuss any questions you have with your health care provider. Document Released: 06/09/2009 Document Revised: 01/19/2016 Document Reviewed: 09/17/2014 Elsevier Interactive Patient Education  2017 [employer].

## 2017-07-31 ENCOUNTER — Ambulatory Visit (INDEPENDENT_AMBULATORY_CARE_PROVIDER_SITE_OTHER): Payer: Medicare Other | Admitting: Family Medicine

## 2017-07-31 ENCOUNTER — Encounter: Payer: Self-pay | Admitting: Family Medicine

## 2017-07-31 VITALS — BP 138/80 | HR 52 | Temp 97.5°F | Resp 16 | Ht 63.5 in | Wt 249.0 lb

## 2017-07-31 DIAGNOSIS — R030 Elevated blood-pressure reading, without diagnosis of hypertension: Secondary | ICD-10-CM | POA: Diagnosis not present

## 2017-07-31 DIAGNOSIS — Z23 Encounter for immunization: Secondary | ICD-10-CM | POA: Diagnosis not present

## 2017-07-31 DIAGNOSIS — E559 Vitamin D deficiency, unspecified: Secondary | ICD-10-CM

## 2017-07-31 DIAGNOSIS — R7303 Prediabetes: Secondary | ICD-10-CM

## 2017-07-31 DIAGNOSIS — M170 Bilateral primary osteoarthritis of knee: Secondary | ICD-10-CM | POA: Diagnosis not present

## 2017-07-31 DIAGNOSIS — Z0000 Encounter for general adult medical examination without abnormal findings: Secondary | ICD-10-CM

## 2017-07-31 DIAGNOSIS — L4050 Arthropathic psoriasis, unspecified: Secondary | ICD-10-CM | POA: Diagnosis not present

## 2017-07-31 DIAGNOSIS — E782 Mixed hyperlipidemia: Secondary | ICD-10-CM | POA: Diagnosis not present

## 2017-07-31 DIAGNOSIS — Z0001 Encounter for general adult medical examination with abnormal findings: Secondary | ICD-10-CM | POA: Diagnosis not present

## 2017-07-31 DIAGNOSIS — Z6841 Body Mass Index (BMI) 40.0 and over, adult: Secondary | ICD-10-CM

## 2017-07-31 MED ORDER — ASPIRIN EC 81 MG PO TBEC: 81.0000 mg | DELAYED_RELEASE_TABLET | Freq: Every day | ORAL | Status: DC

## 2017-07-31 MED ORDER — VITAMIN D (ERGOCALCIFEROL) 1.25 MG (50000 UNIT) PO CAPS: 50000.0000 [IU] | ORAL_CAPSULE | ORAL | 0 refills | Status: DC

## 2017-07-31 NOTE — Assessment & Plan Note (Signed)
Again initial elevated BP, on manual re-check improved No formal dx HTN. Risk factors with morbid obesity and age. Concern with history of mild OSA not on CPAP. Also still with acute MSK pain - No outside BP readings, did not adhere to request again  Plan: 1. Discussion today on progression of HTN. Advised again to check BP outside of office and record readings, bring to next visit 2. Return sooner if elevated readings >140/90 3. Again advised we may start anti-HTN therapy in near future - agree to hold for now with some wt loss and improving lifestyle

## 2017-07-31 NOTE — Assessment & Plan Note (Signed)
Some weight loss 2-3 lbs in 6 months, still concern limited lifestyle changes Concern with Pre-HTN, likely inc risk progression to HTN. Concern for future worsening OSA as well, however negative screening history now. - Pre-DM, HLD  Plan: 1. Again - discussion on dramatic lifestyle modification improvement with diet and exercise, return to gym, however likely limited by LE pain - not improved with Derm treatment, now awaiting Rheumatology 2. Emphasized importance of lifestyle changes diet, low sodium, significantly reduce soda intake inc water, smaller portions 3. Follow-up 4 months - reconsider again Weight Management referral, medications other options

## 2017-07-31 NOTE — Assessment & Plan Note (Signed)
Stable chronic L>R Knee OA/DJD with likely underlying psoriatic arthritis component Continue current regimen conservative care Agree with referral per Derm to Rheumatology for further eval - patient needs to schedule apt

## 2017-07-31 NOTE — Assessment & Plan Note (Signed)
Again repeat Vit D still low at 17, unchanged. Non adherence to med  Reviewed plan to continue Vitamin D3 5,000 unit daily for up to 3 months or more, had been non adherent, then switch to Vitamin D3 2,000 u daily for maintenance

## 2017-07-31 NOTE — Assessment & Plan Note (Signed)
Uncontrolled cholesterol improving lifestyle Last lipid panel 06/2017 Calculated ASCVD 10 yr risk score >6.5%  Plan: 1. Discussed ASCVD risk - considered statin therapy - agree to hold for now 2. START ASA 81mg  for primary ASCVD risk reduction 3. Encourage improved lifestyle - low carb/cholesterol, reduce portion size, continue improving regular exercise

## 2017-07-31 NOTE — Assessment & Plan Note (Addendum)
Stable chronic not improved underlying arthritic component contributing to her chronic joint pain - Derm = [facility] Dr Roseanne RenoStewart - On Taltz injection >1 yr for immune-modulating therapy for resolving plaque psoriasis skin, limited joint improvement - Known OA/DJD in knees, last X-ray 09/2016   Continue current regimen conservative care Agree with referral per Derm to Rheumatology for further eval - patient needs to schedule apt

## 2017-07-31 NOTE — Patient Instructions (Addendum)
Thank you for coming to the clinic today.  1. Recommend taking Vitamin D3 5,000 daily for 12 weeks then reduce to Vitamin D3 2,000 daily  2. Due for Prevnar-13 today then 1 year later Pneumovax-23  3. Start Baby Aspirin 81mg  (enteric coated) once daily to reduce cardiovascular risk  4. Still Pre-Diabetes, keep working on improving diet and exercise towards goal  Please schedule a Follow-up Appointment to: Return in about 4 months (around 11/29/2017) for Pre-DM A1c, Weight, HTN, Joint Pain update.  If you have any other questions or concerns, please feel free to call the clinic or send a message through MyChart. You may also schedule an earlier appointment if necessary.  Additionally, you may be receiving a survey about your experience at our clinic within a few days to 1 week by e-mail or mail. We value your feedback.  Saralyn PilarAlexander Carlito Bogert, DO [facility]
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
## 2017-07-31 NOTE — Assessment & Plan Note (Signed)
Stable Pre-DM with A1c 5.8 from prior 5.7 Concern with morbid obesity, Pre HTN, HLD  Plan:  1. Not on any therapy currently - remain off 2. Encourage improved lifestyle - low carb, low sugar diet, reduce portion size, continue improving regular exercise 3. Follow-up 4 months PreDM A1c

## 2017-08-29 DIAGNOSIS — M2550 Pain in unspecified joint: Secondary | ICD-10-CM | POA: Insufficient documentation

## 2017-08-29 DIAGNOSIS — R200 Anesthesia of skin: Secondary | ICD-10-CM | POA: Insufficient documentation

## 2017-09-02 ENCOUNTER — Telehealth: Payer: Self-pay | Admitting: Family Medicine

## 2017-09-02 NOTE — Telephone Encounter (Signed)
Pt. Stopped by  To wanting to know if she could get a prescription for fluid pill  Also im putting info in her chart from  an outside dr.

## 2017-09-02 NOTE — Telephone Encounter (Signed)
Please notify her of the following updates:  If she would like, I would recommend a lower dose HCTZ BP pill that acts as a fluid pill. If she would like I could sent this rx to her pharmacy to start, I would recommend lower dose 12.5mg  daily to start.  I have completed Handicap Placard form - it is ready to be mailed or picked up as of 09/02/17. I did it for a Temporary Placard good for 6 months, we will need to renew at that time if needed. We can discuss longer term Handicap Placard in future if need.  Saralyn PilarAlexander Kileen Lange, DO [facility] 09/02/2017, 5:20 PM

## 2017-09-03 NOTE — Telephone Encounter (Signed)
Left message for patient to call back  

## 2017-09-05 NOTE — Telephone Encounter (Signed)
Left message for patient to call back  

## 2017-09-06 NOTE — Telephone Encounter (Signed)
Attempted to call patient several time but no answer left several VM and my chart message send.

## 2017-09-10 ENCOUNTER — Telehealth: Payer: Self-pay

## 2017-09-10 DIAGNOSIS — R030 Elevated blood-pressure reading, without diagnosis of hypertension: Secondary | ICD-10-CM

## 2017-09-10 DIAGNOSIS — E538 Deficiency of other specified B group vitamins: Secondary | ICD-10-CM | POA: Insufficient documentation

## 2017-09-10 DIAGNOSIS — R600 Localized edema: Secondary | ICD-10-CM

## 2017-09-10 MED ORDER — CYANOCOBALAMIN 1000 MCG/ML IJ SOLN: 1000.0000 ug | INTRAMUSCULAR | 0 refills | Status: DC

## 2017-09-10 MED ORDER — HYDROCHLOROTHIAZIDE 12.5 MG PO TABS: 12.5000 mg | ORAL_TABLET | Freq: Every day | ORAL | 3 refills | Status: DC

## 2017-09-10 NOTE — Telephone Encounter (Signed)
Left message for patient to call back  

## 2017-09-10 NOTE — Telephone Encounter (Signed)
Patient walked in office today requesting Rx for fluid pill ( lower dose HCTZ ) and vit B 12 as per patient it was originally Rx by Rheumatologist could be Dr. Brynda Greathousehrlstene Bock.

## 2017-09-10 NOTE — Telephone Encounter (Signed)
Pt advised.

## 2017-09-10 NOTE — Telephone Encounter (Signed)
Please call patient and review the following, recommendations and schedule her for B12 Injection Nurse visits and also schedule Lab Only in 6 weeks to re-check Vitamin B12.  Reviewed paperwork from Rheumatology Dr Renard MatterBock, Vitamin B12 235, low, and Low vitamin D 13.  I have ordered Vitamin B12 1,07800mcg weekly injection to be sent to pharmacy and she may pick it up, and have a weekly injection at our office, needs to be scheduled for 4 weeks.  She should also take Vitamin D3 5,000 iu OTC daily for supplement for 12 weeks.  Then we can re-check blood work with B12.  Also sent rx HCTZ 12.5mg  daily fluid pill, that may lower BP as well.  Saralyn PilarAlexander Karamalegos, DO [facility] 09/10/2017, 10:28 AM

## 2017-09-12 ENCOUNTER — Ambulatory Visit (INDEPENDENT_AMBULATORY_CARE_PROVIDER_SITE_OTHER): Payer: Medicare Other

## 2017-09-12 DIAGNOSIS — E538 Deficiency of other specified B group vitamins: Secondary | ICD-10-CM

## 2017-09-12 MED ORDER — CYANOCOBALAMIN 1000 MCG/ML IJ SOLN
1000.0000 ug | Freq: Once | INTRAMUSCULAR | Status: AC
  Administered 2017-09-12: 1000 ug via INTRAMUSCULAR

## 2017-09-16 ENCOUNTER — Encounter: Payer: Self-pay | Admitting: Occupational Therapy

## 2017-09-16 ENCOUNTER — Ambulatory Visit: Payer: Medicare Other | Attending: *Deleted | Admitting: Occupational Therapy

## 2017-09-16 ENCOUNTER — Other Ambulatory Visit: Payer: Self-pay

## 2017-09-16 DIAGNOSIS — I890 Lymphedema, not elsewhere classified: Secondary | ICD-10-CM | POA: Insufficient documentation

## 2017-09-16 NOTE — Patient Instructions (Signed)

## 2017-09-19 ENCOUNTER — Ambulatory Visit (INDEPENDENT_AMBULATORY_CARE_PROVIDER_SITE_OTHER): Payer: Medicare Other

## 2017-09-19 DIAGNOSIS — E538 Deficiency of other specified B group vitamins: Secondary | ICD-10-CM

## 2017-09-19 MED ORDER — CYANOCOBALAMIN 1000 MCG/ML IJ SOLN
1000.0000 ug | Freq: Once | INTRAMUSCULAR | Status: AC
  Administered 2017-09-19: 1000 ug via INTRAMUSCULAR

## 2017-09-24 ENCOUNTER — Ambulatory Visit: Payer: Medicare Other | Admitting: Occupational Therapy

## 2017-09-24 ENCOUNTER — Encounter: Payer: Self-pay | Admitting: Occupational Therapy

## 2017-09-24 DIAGNOSIS — I890 Lymphedema, not elsewhere classified: Secondary | ICD-10-CM | POA: Diagnosis not present
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
## 2017-09-24 NOTE — Patient Instructions (Signed)

## 2017-09-25 ENCOUNTER — Ambulatory Visit: Payer: Medicare Other | Admitting: Occupational Therapy

## 2017-09-25 DIAGNOSIS — I890 Lymphedema, not elsewhere classified: Secondary | ICD-10-CM

## 2017-09-26 ENCOUNTER — Ambulatory Visit (INDEPENDENT_AMBULATORY_CARE_PROVIDER_SITE_OTHER): Payer: Medicare Other

## 2017-09-26 ENCOUNTER — Ambulatory Visit: Payer: Medicare Other | Admitting: Occupational Therapy

## 2017-09-26 DIAGNOSIS — I890 Lymphedema, not elsewhere classified: Secondary | ICD-10-CM

## 2017-09-26 DIAGNOSIS — E538 Deficiency of other specified B group vitamins: Secondary | ICD-10-CM

## 2017-09-26 MED ORDER — CYANOCOBALAMIN 1000 MCG/ML IJ SOLN
1000.0000 ug | Freq: Once | INTRAMUSCULAR | Status: AC
Start: 2017-09-26 — End: 2017-09-26
  Administered 2017-09-26: 1000 ug via INTRAMUSCULAR

## 2017-09-26 NOTE — Therapy (Signed)
[facility] MAIN [facility] SERVICES [address] [place], [place], [address] Phone: [phone]   Fax:  [phone]  Occupational Therapy Treatment  Patient Details  Name: Morgan Mcclain MRN: [id number] Date of Birth: 1951-02-08 Referring Provider: [name], MD   Encounter Date: 09/25/2017  OT End of Session - 09/25/17 1209    Visit Number  3    Number of Visits  36    Date for OT Re-Evaluation  12/15/17    OT Start Time  1000    OT Stop Time  1100    OT Time Calculation (min)  60 min    Activity Tolerance  Patient tolerated treatment well;No increased pain    Behavior During Therapy  WFL for tasks assessed/performed       Past Medical History:  Diagnosis Date  . Arthritis   . Left ureteral calculus   . Psoriasis SEVERE--  RECEIVES LASE TX'S TWICE WEEKLY  . Renal calculus, left   . Sleep apnea 02/23/2015   No longer on CPAP    Past Surgical History:  Procedure Laterality Date  . ABDOMINAL HYSTERECTOMY  1994   W/ UNILATERAL SALPINGOOPHORECTOMY  . COLONOSCOPY WITH PROPOFOL N/A 03/04/2015   Procedure: COLONOSCOPY WITH PROPOFOL;  Surgeon: Midge Minium, MD;  Location: [facility];  Service: Endoscopy;  Laterality: N/A;  . CYSTOSCOPY WITH RETROGRADE PYELOGRAM, URETEROSCOPY AND STENT PLACEMENT Left 12/25/2012   Procedure: CYSTOSCOPY WITH RETROGRADE PYELOGRAM, URETEROSCOPY AND STENT PLACEMENT;  Surgeon: Sebastian Ache, MD;  Location: [facility];  Service: Urology;  Laterality: Left;  90 MIN NEEDS DIG URETEROSCOPE   . HOLMIUM LASER APPLICATION Left 12/25/2012   Procedure: HOLMIUM LASER APPLICATION;  Surgeon: Sebastian Ache, MD;  Location: [facility];  Service: Urology;  Laterality: Left;  . TONSILLECTOMY  AGE 67    There were no vitals filed for this visit.      [facility] OT Assessment - 09/26/17 0001      Observation/Other Assessments   Outcome Measures  baseline LLIS score= 71%                OT Treatments/Exercises (OP) - 09/26/17 0001      Manual Therapy   Manual Therapy  Compression Bandaging;Manual Lymphatic Drainage (MLD);Edema management    Edema Management  skin care with low ph  Eucerin lotion prior to applying compression wraps to LLE    Manual Lymphatic Drainage (MLD)  Commenced LLE MLD utilizing "short neck" sequence, functional inguinal LN and deep abdominal lymphatics    Compression Bandaging  multi layer, gradient  pattern, knee length, LLE compression wrapss- multi layered w/ Rodidal foam overlapped 1/3 over cotton stockinette from foot to below knee. 8, 10 and 12 cm short stretch wraps in circumferential gradient pattern over  FOAM.             OT Education - 09/25/17 1208    Education provided  Yes    Education Details  Ephasis of skilled Pt edu on LE self care- compression wrapping and into to simple self-MLD    Person(s) Educated  Patient    Methods  Explanation;Demonstration;Tactile cues;Verbal cues;Handout    Comprehension  Verbalized understanding;Returned demonstration;Verbal cues required;Tactile cues required;Need further instruction          OT Long Term Goals - 09/24/17 1019      OT LONG TERM GOAL #1   Title  Pt modified independent w/ lymphedema precautions and prevention principals and strategies using printed reference to  limit LE progression and infection risk.  (Pended)     Baseline  Max A  (Pended)     Time  2  (Pended)     Period  Weeks  (Pended)     Status  New  (Pended)       OT LONG TERM GOAL #2   Title  Lymphedema (LE) management/ self-care: Pt able to apply thigh length, multi layered, gradient compression wraps independently using proper techniques within 2 weeks to achieve optimal limb volume reductions in preparation for fitting compression garments/ devices bilaterally.  (Pended)     Baseline  Max A  (Pended)     Time  2  (Pended)     Period  Weeks  (Pended)     Status  New  (Pended)       OT LONG  TERM GOAL #3   Title  Lymphedema (LE) management/ self-care:  Pt to achieve at least 10%  BLE limb volume reductions  during Intensive Phase CDT to limit LE progression, to reduce pain, to improve ADLs performance.  (Pended)     Baseline  Max A  (Pended)     Time  12  (Pended)     Period  Weeks  (Pended)     Status  New  (Pended)       OT LONG TERM GOAL #4   Title  Lymphedema (LE) management/ self-care:  Pt to tolerate daily compression wraps, compression garments and/ or HOS devices in keeping w/ prescribed wear regime within 1 week of issue date of each to progress and retain clinical and functional gains and to limit LE progression.  (Pended)     Baseline  Max A  (Pended)     Time  12  (Pended)     Period  Weeks  (Pended)     Status  New  (Pended)       OT LONG TERM GOAL #5   Title  Lymphedema (LE) Pain: Pt to report reduction in leg pain from 8/10 to 3/10 when standing/ walking > 2 hours  to achieve increased functional performance with instrumental ADLs, to improve social participation and to improve role performance.  (Pended)     Baseline  Max A  (Pended)     Time  12  (Pended)     Period  Weeks  (Pended)     Status  New  (Pended)       OT LONG TERM GOAL #6   Title  Lymphedema (LE) management/ self-care:  During Management Phase CDT Pt to sustain current limb volumes within 3%, and all other clinical gains achieved during OT treatment independently (to control limb swelling and associated pain, to  limit LE progression, to decrease infection risk and to limit further functional decline.  (Pended)     Baseline  Max A  (Pended)     Time  6  (Pended)     Period  Months  (Pended)     Status  New  (Pended)             Plan - 09/26/17 1213    Clinical Impression Statement  Commenced LLE MLD today. Continuwed skilled edu for LE self care w/ emphasis on seld wrapping LLE to knee and simple self MLD. Pt's initial Lymphedema Life Impact Scale (LLIS) score reveals extent to which  problems associated w/ BLE lymphedema affected physicqal, psychosocial and functional concerns in the last week measures 71%. Cont as per POC.    Occupational performance  deficits (Please refer to evaluation for details):  ADL's;IADL's;Work;Leisure;Social Participation;Other body image, role performance    Rehab Potential  Good    OT Frequency  3x / week    OT Duration  Other (comment) Intensive Phase CDT. Management Phase with PRN frequency for follow along    OT Treatment/Interventions  Self-care/ADL training;DME and/or AE instruction;Manual Therapy;Patient/family education;Manual lymph drainage;Therapeutic exercise;Compression bandaging;Therapeutic activities;Other (comment);Functional Mobility Training skin care with low ph lotion and/ or casor oil during MLD    Plan  fit with appropriate BLE elastic compression stockings, HOS devices to limit fibrosis formation - that are easy to don/ doff w/ AE PRN, and are comfortable for optimal complioance.    Clinical Decision Making  Several treatment options, min-mod task modification necessary    OT Home Exercise Plan  all LE self care protocols, including simple self-MLD, skin care. lymphatic pumping ther ex and compression for daytime and HOS    Recommended Other Services  Consider [employer] sequential , pneumatic compression device ("pump" PRN  for optimal long term self management       Patient will benefit from skilled therapeutic intervention in order to improve the following deficits and impairments:  Decreased knowledge of use of DME, Decreased skin integrity, Increased edema, Pain, Decreased mobility, Decreased activity tolerance, Decreased knowledge of precautions, Difficulty walking, Obesity  Visit Diagnosis: Lymphedema, not elsewhere classified    Problem List Patient Active Problem List   Diagnosis Date Noted  . Vitamin B12 deficiency 09/10/2017  . Degenerative arthritis of knee, bilateral 10/15/2016  . Chronic pain of left  ankle 10/15/2016  . Hyperlipidemia 10/15/2016  . Pre-diabetes 06/06/2016  . Psoriasis with arthropathy (HCC) 06/04/2016  . Morbid obesity with BMI of 40.0-44.9, adult (HCC) 06/04/2016  . Pre-hypertension 06/04/2016  . Vitamin D deficiency 06/04/2016  . Fatigue 06/04/2016  . Bilateral lower extremity edema 06/04/2016  . Healthcare maintenance 06/04/2016  . Special screening for malignant neoplasms, colon   . Sleep apnea 02/23/2015    Loel Dubonnet, MS, OTR/L, CLT-LANA 09/26/17 12:24 PM 0 .................. [facility] MAIN [facility] SERVICES [address] [place], [place], [address] Phone: [phone]   Fax:  [phone]  Name: HADASA GASNER MRN: [id number] Date of Birth: 10/21/1950
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
## 2017-09-30 ENCOUNTER — Ambulatory Visit: Payer: Medicare Other | Attending: Internal Medicine | Admitting: Occupational Therapy

## 2017-09-30 DIAGNOSIS — I890 Lymphedema, not elsewhere classified: Secondary | ICD-10-CM | POA: Diagnosis not present
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
## 2017-10-01 ENCOUNTER — Ambulatory Visit: Payer: Medicare Other | Admitting: Occupational Therapy

## 2017-10-01 DIAGNOSIS — I890 Lymphedema, not elsewhere classified: Secondary | ICD-10-CM
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
## 2017-10-02 ENCOUNTER — Ambulatory Visit: Payer: Medicare Other | Admitting: Occupational Therapy

## 2017-10-02 DIAGNOSIS — I890 Lymphedema, not elsewhere classified: Secondary | ICD-10-CM

## 2017-10-03 ENCOUNTER — Ambulatory Visit (INDEPENDENT_AMBULATORY_CARE_PROVIDER_SITE_OTHER): Payer: Medicare Other

## 2017-10-03 DIAGNOSIS — E538 Deficiency of other specified B group vitamins: Secondary | ICD-10-CM | POA: Diagnosis not present

## 2017-10-03 MED ORDER — CYANOCOBALAMIN 1000 MCG/ML IJ SOLN
1000.0000 ug | Freq: Once | INTRAMUSCULAR | Status: AC
  Administered 2017-10-03: 1000 ug via INTRAMUSCULAR
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
## 2017-10-07 ENCOUNTER — Ambulatory Visit: Payer: Medicare Other | Admitting: Occupational Therapy

## 2017-10-07 DIAGNOSIS — I890 Lymphedema, not elsewhere classified: Secondary | ICD-10-CM | POA: Diagnosis not present
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
## 2017-10-09 ENCOUNTER — Ambulatory Visit: Payer: Medicare Other | Admitting: Occupational Therapy

## 2017-10-10 ENCOUNTER — Other Ambulatory Visit: Payer: Medicare Other

## 2017-10-10 ENCOUNTER — Ambulatory Visit: Payer: Medicare Other | Admitting: Occupational Therapy

## 2017-10-10 DIAGNOSIS — I890 Lymphedema, not elsewhere classified: Secondary | ICD-10-CM | POA: Diagnosis not present

## 2017-10-10 DIAGNOSIS — E538 Deficiency of other specified B group vitamins: Secondary | ICD-10-CM
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
## 2017-10-11 LAB — VITAMIN B12: Vitamin B-12: 1144 pg/mL — ABNORMAL HIGH (ref 200–1100)

## 2017-10-14 ENCOUNTER — Ambulatory Visit: Payer: Medicare Other | Admitting: Occupational Therapy

## 2017-10-14 DIAGNOSIS — I890 Lymphedema, not elsewhere classified: Secondary | ICD-10-CM
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
## 2017-10-16 ENCOUNTER — Ambulatory Visit: Payer: Medicare Other | Admitting: Occupational Therapy

## 2017-10-16 DIAGNOSIS — I890 Lymphedema, not elsewhere classified: Secondary | ICD-10-CM | POA: Diagnosis not present
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
## 2017-10-17 ENCOUNTER — Ambulatory Visit: Payer: Medicare Other | Admitting: Occupational Therapy

## 2017-10-17 DIAGNOSIS — I890 Lymphedema, not elsewhere classified: Secondary | ICD-10-CM
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
## 2017-10-22 ENCOUNTER — Ambulatory Visit: Payer: Medicare Other | Admitting: Occupational Therapy

## 2017-10-22 DIAGNOSIS — I890 Lymphedema, not elsewhere classified: Secondary | ICD-10-CM

## 2017-10-23 ENCOUNTER — Ambulatory Visit: Payer: Medicare Other | Admitting: Occupational Therapy

## 2017-10-23 DIAGNOSIS — I890 Lymphedema, not elsewhere classified: Secondary | ICD-10-CM
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
## 2017-10-24 ENCOUNTER — Ambulatory Visit: Payer: Medicare Other | Admitting: Occupational Therapy

## 2017-10-24 DIAGNOSIS — I890 Lymphedema, not elsewhere classified: Secondary | ICD-10-CM | POA: Diagnosis not present
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
## 2017-10-28 ENCOUNTER — Ambulatory Visit: Payer: Self-pay | Attending: *Deleted | Admitting: Physical Therapy

## 2017-10-28 ENCOUNTER — Ambulatory Visit: Payer: Medicare Other | Attending: *Deleted | Admitting: Occupational Therapy

## 2017-10-28 DIAGNOSIS — R262 Difficulty in walking, not elsewhere classified: Secondary | ICD-10-CM | POA: Insufficient documentation

## 2017-10-28 DIAGNOSIS — I890 Lymphedema, not elsewhere classified: Secondary | ICD-10-CM | POA: Diagnosis not present

## 2017-10-28 NOTE — Therapy (Signed)
[facility] [facility] MAIN [facility] SERVICES [address] [place], [place], [address] Phone: [phone]   Fax:  [phone]  Physical Therapy Treatment  Patient Details  Name: Morgan Mcclain MRN: [id number] Date of Birth: April 21, 1951 Referring Provider: Dayna Barkerhristele Behalal-Bock, MD   Encounter Date: 10/28/2017    Past Medical History:  Diagnosis Date  . Arthritis   . Left ureteral calculus   . Psoriasis SEVERE--  RECEIVES LASE TX'S TWICE WEEKLY  . Renal calculus, left   . Sleep apnea 02/23/2015   No longer on CPAP    Past Surgical History:  Procedure Laterality Date  . ABDOMINAL HYSTERECTOMY  1994   W/ UNILATERAL SALPINGOOPHORECTOMY  . COLONOSCOPY WITH PROPOFOL N/A 03/04/2015   Procedure: COLONOSCOPY WITH PROPOFOL;  Surgeon: Midge Miniumarren Wohl, MD;  [facility];  Service: Endoscopy;  Laterality: N/A;  . CYSTOSCOPY WITH RETROGRADE PYELOGRAM, URETEROSCOPY AND STENT PLACEMENT Left 12/25/2012   Procedure: CYSTOSCOPY WITH RETROGRADE PYELOGRAM, URETEROSCOPY AND STENT PLACEMENT;  Surgeon: Sebastian Acheheodore Manny, MD;  [facility] [place];  Service: Urology;  Laterality: Left;  90 MIN NEEDS DIG URETEROSCOPE   . HOLMIUM LASER APPLICATION Left 12/25/2012   Procedure: HOLMIUM LASER APPLICATION;  Surgeon: Sebastian Acheheodore Manny, MD;  [facility];  Service: Urology;  Laterality: Left;  . TONSILLECTOMY  AGE 62    There were no vitals filed for this visit.    PT/OT/SLP Screening Form   Time: in 9:45    Time out    Complaint: walking is not good Past Medical Hx: Patient has been seeing OT for the past 6 weeks for lymphedema Injury Date Patient reports that her walking has decreased in the past year :   Pain Scale:feet hurt  5/10 BLE feet Patient's phone number: [phone] Dr: Tresa MooreBehalal-bock, Christele Hx (this occurrence):  Patient is having difficulty with walking due to the swelling in her legs.      Assessment: Strength:  RLE : hip flex 3+/5  hip abd  3/5 , hip extension -  3/5, hip add 2/5  LLE : hip flex 3+/5  hip abd  3/5 , hip extension -  3/5, hip add 2/5   ROM:WFL   Balance:  5 x sit to stand 14.56 TUG: 9.63 10 MW . 96 m/sec   Sensation:WFL      Recommendations:    Comments:    [x]  Patient would benefit from an MD referral [x]  Patient would benefit from a full PT/OT/ SLP evaluation and treatment. []  No intervention recommended at this time                                      Patient will benefit from skilled therapeutic intervention in order to improve the following deficits and impairments:     Visit Diagnosis: Difficulty in walking, not elsewhere classified     Problem List Patient Active Problem List   Diagnosis Date Noted  . Vitamin B12 deficiency 09/10/2017  . Degenerative arthritis of knee, bilateral 10/15/2016  . Chronic pain of left ankle 10/15/2016  . Hyperlipidemia 10/15/2016  . Pre-diabetes 06/06/2016  . Psoriasis with arthropathy ([facility]) 06/04/2016  . Morbid obesity with BMI of 40.0-44.9, adult ([facility]) 06/04/2016  . Pre-hypertension 06/04/2016  . Vitamin D deficiency 06/04/2016  . Fatigue 06/04/2016  . Bilateral lower extremity edema 06/04/2016  . Healthcare maintenance 06/04/2016  . Special screening for malignant neoplasms, colon   .  Sleep apnea 02/23/2015    Morgan Mcclain, [place] DPT 10/28/2017, 9:48 AM  [place] [facility] MAIN [facility] SERVICES [address] [place], [place], [address] Phone: [phone]   Fax:  [phone]  Name: Morgan Mcclain MRN: [id number] Date of Birth: 02-15-1951
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
## 2017-10-29 ENCOUNTER — Encounter: Payer: Medicare Other | Admitting: Occupational Therapy

## 2017-10-31 ENCOUNTER — Ambulatory Visit: Payer: Medicare Other | Admitting: Occupational Therapy

## 2017-10-31 DIAGNOSIS — R262 Difficulty in walking, not elsewhere classified: Secondary | ICD-10-CM

## 2017-10-31 DIAGNOSIS — I890 Lymphedema, not elsewhere classified: Secondary | ICD-10-CM | POA: Diagnosis not present
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
## 2017-11-04 ENCOUNTER — Ambulatory Visit: Payer: Medicare Other | Admitting: Occupational Therapy

## 2017-11-04 DIAGNOSIS — I890 Lymphedema, not elsewhere classified: Secondary | ICD-10-CM
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
## 2017-11-05 ENCOUNTER — Encounter: Payer: Medicare Other | Admitting: Occupational Therapy

## 2017-11-07 ENCOUNTER — Ambulatory Visit: Payer: Medicare Other | Admitting: Occupational Therapy

## 2017-11-07 DIAGNOSIS — I890 Lymphedema, not elsewhere classified: Secondary | ICD-10-CM | POA: Diagnosis not present
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
## 2017-11-11 ENCOUNTER — Ambulatory Visit: Payer: Medicare Other | Admitting: Occupational Therapy

## 2017-11-11 DIAGNOSIS — I890 Lymphedema, not elsewhere classified: Secondary | ICD-10-CM

## 2017-11-11 NOTE — Addendum Note (Signed)
Addended by: Judithann SaugerGILLIAM, Elesha Thedford L on: 11/11/2017 12:10 PM   Modules accepted: Orders
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
## 2017-11-11 NOTE — Patient Instructions (Signed)

## 2017-11-12 ENCOUNTER — Encounter: Payer: Medicare Other | Admitting: Occupational Therapy

## 2017-11-14 ENCOUNTER — Ambulatory Visit: Payer: Medicare Other | Admitting: Occupational Therapy

## 2017-11-14 DIAGNOSIS — I890 Lymphedema, not elsewhere classified: Secondary | ICD-10-CM | POA: Diagnosis not present
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
## 2017-11-18 ENCOUNTER — Ambulatory Visit: Payer: Medicare Other | Admitting: Occupational Therapy

## 2017-11-19 ENCOUNTER — Encounter: Payer: Medicare Other | Admitting: Occupational Therapy

## 2017-11-21 ENCOUNTER — Ambulatory Visit: Payer: Medicare Other | Admitting: Occupational Therapy

## 2017-11-21 DIAGNOSIS — I890 Lymphedema, not elsewhere classified: Secondary | ICD-10-CM
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
## 2017-11-25 ENCOUNTER — Ambulatory Visit: Payer: Medicare Other | Attending: *Deleted | Admitting: Occupational Therapy

## 2017-11-25 DIAGNOSIS — R2689 Other abnormalities of gait and mobility: Secondary | ICD-10-CM | POA: Insufficient documentation

## 2017-11-25 DIAGNOSIS — I890 Lymphedema, not elsewhere classified: Secondary | ICD-10-CM | POA: Diagnosis not present

## 2017-11-25 DIAGNOSIS — R262 Difficulty in walking, not elsewhere classified: Secondary | ICD-10-CM | POA: Insufficient documentation
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
## 2017-11-25 NOTE — Patient Instructions (Signed)
Daily compression routine  1--  Don loaned ccl 2  Compression garments first thing every morning.   2- Remove wraps in the evening when settling in to watch tv or read, and apply compression wraps.   3- Elevate l;egs when seated.  4- Perform gentle lymphatic pumping there ex and self MLD to ease leg pain/ discomfort.

## 2017-11-26 ENCOUNTER — Ambulatory Visit: Payer: Medicare Other | Admitting: Family Medicine

## 2017-11-27 ENCOUNTER — Encounter: Payer: Medicare Other | Admitting: Occupational Therapy

## 2017-11-28 ENCOUNTER — Ambulatory Visit: Payer: Medicare Other

## 2017-11-28 ENCOUNTER — Ambulatory Visit: Payer: Medicare Other | Admitting: Occupational Therapy

## 2017-12-03 ENCOUNTER — Ambulatory Visit: Payer: Medicare Other | Admitting: Occupational Therapy

## 2017-12-03 DIAGNOSIS — I890 Lymphedema, not elsewhere classified: Secondary | ICD-10-CM
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
## 2017-12-04 ENCOUNTER — Encounter: Payer: Medicare Other | Admitting: Occupational Therapy

## 2017-12-05 ENCOUNTER — Other Ambulatory Visit: Payer: Self-pay

## 2017-12-05 ENCOUNTER — Ambulatory Visit: Payer: Medicare Other

## 2017-12-05 ENCOUNTER — Ambulatory Visit: Payer: Medicare Other | Admitting: Occupational Therapy

## 2017-12-05 DIAGNOSIS — R2689 Other abnormalities of gait and mobility: Secondary | ICD-10-CM

## 2017-12-05 DIAGNOSIS — I890 Lymphedema, not elsewhere classified: Secondary | ICD-10-CM | POA: Diagnosis not present
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
## 2017-12-09 ENCOUNTER — Ambulatory Visit: Payer: Medicare Other | Admitting: Occupational Therapy

## 2017-12-10 ENCOUNTER — Encounter: Payer: Medicare Other | Admitting: Occupational Therapy

## 2017-12-12 ENCOUNTER — Ambulatory Visit: Payer: Medicare Other | Admitting: Occupational Therapy

## 2017-12-12 ENCOUNTER — Ambulatory Visit: Payer: Medicare Other

## 2017-12-12 DIAGNOSIS — I890 Lymphedema, not elsewhere classified: Secondary | ICD-10-CM | POA: Diagnosis not present

## 2017-12-12 DIAGNOSIS — R262 Difficulty in walking, not elsewhere classified: Secondary | ICD-10-CM

## 2017-12-12 DIAGNOSIS — R2689 Other abnormalities of gait and mobility: Secondary | ICD-10-CM

## 2017-12-12 NOTE — Patient Instructions (Signed)
Access Code: [email or web address]  URL: [email or web address]  Date: 12/12/2017  Prepared by: Precious BardMarina Kielee Care   Exercises  . Standing Weight Shift Side to Side - 10 reps - 1 sets - 3 hold - 1x daily - 7x weekly  . Standing March with Counter Support - 10 reps - 2 sets - 1x daily - 7x weekly  . Seated Toe Raise - 10 reps - 2 sets - 1x daily - 7x weekly  . Seated Hip Adduction Isometrics with Ball - 10 reps - 1 sets - 1x daily - 7x weekly  . Tandem Stance with Chair Support - 10 reps - 1 sets - 10 hold - 1x daily - 7x weekly
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
## 2017-12-16 ENCOUNTER — Ambulatory Visit: Payer: Medicare Other

## 2017-12-16 ENCOUNTER — Ambulatory Visit: Payer: Medicare Other | Admitting: Occupational Therapy

## 2017-12-16 DIAGNOSIS — R262 Difficulty in walking, not elsewhere classified: Secondary | ICD-10-CM

## 2017-12-16 DIAGNOSIS — R2689 Other abnormalities of gait and mobility: Secondary | ICD-10-CM

## 2017-12-16 DIAGNOSIS — I890 Lymphedema, not elsewhere classified: Secondary | ICD-10-CM
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
## 2017-12-17 ENCOUNTER — Encounter: Payer: Medicare Other | Admitting: Occupational Therapy

## 2017-12-19 ENCOUNTER — Ambulatory Visit: Payer: Medicare Other

## 2017-12-19 ENCOUNTER — Encounter: Payer: Medicare Other | Admitting: Occupational Therapy

## 2017-12-19 DIAGNOSIS — I890 Lymphedema, not elsewhere classified: Secondary | ICD-10-CM | POA: Diagnosis not present

## 2017-12-19 DIAGNOSIS — R2689 Other abnormalities of gait and mobility: Secondary | ICD-10-CM

## 2017-12-19 DIAGNOSIS — R262 Difficulty in walking, not elsewhere classified: Secondary | ICD-10-CM
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
## 2017-12-23 ENCOUNTER — Ambulatory Visit: Payer: Medicare Other

## 2017-12-23 DIAGNOSIS — I890 Lymphedema, not elsewhere classified: Secondary | ICD-10-CM

## 2017-12-23 DIAGNOSIS — R262 Difficulty in walking, not elsewhere classified: Secondary | ICD-10-CM

## 2017-12-23 DIAGNOSIS — R2689 Other abnormalities of gait and mobility: Secondary | ICD-10-CM
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
## 2017-12-25 ENCOUNTER — Ambulatory Visit (INDEPENDENT_AMBULATORY_CARE_PROVIDER_SITE_OTHER): Payer: Medicare Other | Admitting: Family Medicine

## 2017-12-25 ENCOUNTER — Encounter: Payer: Self-pay | Admitting: Family Medicine

## 2017-12-25 VITALS — BP 130/70 | HR 55 | Temp 97.6°F | Resp 16 | Ht 63.5 in | Wt 245.0 lb

## 2017-12-25 DIAGNOSIS — L4050 Arthropathic psoriasis, unspecified: Secondary | ICD-10-CM

## 2017-12-25 DIAGNOSIS — E559 Vitamin D deficiency, unspecified: Secondary | ICD-10-CM | POA: Diagnosis not present

## 2017-12-25 DIAGNOSIS — E785 Hyperlipidemia, unspecified: Secondary | ICD-10-CM | POA: Diagnosis not present

## 2017-12-25 DIAGNOSIS — R7303 Prediabetes: Secondary | ICD-10-CM | POA: Diagnosis not present

## 2017-12-25 DIAGNOSIS — Z79899 Other long term (current) drug therapy: Secondary | ICD-10-CM | POA: Diagnosis not present

## 2017-12-25 DIAGNOSIS — Z6841 Body Mass Index (BMI) 40.0 and over, adult: Secondary | ICD-10-CM

## 2017-12-25 DIAGNOSIS — E538 Deficiency of other specified B group vitamins: Secondary | ICD-10-CM

## 2017-12-25 DIAGNOSIS — R030 Elevated blood-pressure reading, without diagnosis of hypertension: Secondary | ICD-10-CM

## 2017-12-25 LAB — CBC WITH DIFFERENTIAL/PLATELET
BASOS ABS: 42 {cells}/uL (ref 0–200)
Basophils Relative: 1.1 %
EOS PCT: 3.7 %
Eosinophils Absolute: 141 cells/uL (ref 15–500)
HEMATOCRIT: 42.1 % (ref 35.0–45.0)
Hemoglobin: 14.5 g/dL (ref 11.7–15.5)
LYMPHS ABS: 1151 {cells}/uL (ref 850–3900)
MCH: 31.5 pg (ref 27.0–33.0)
MCHC: 34.4 g/dL (ref 32.0–36.0)
MCV: 91.3 fL (ref 80.0–100.0)
MONOS PCT: 12.4 %
MPV: 11.3 fL (ref 7.5–12.5)
NEUTROS PCT: 52.5 %
Neutro Abs: 1995 cells/uL (ref 1500–7800)
PLATELETS: 184 10*3/uL (ref 140–400)
RBC: 4.61 10*6/uL (ref 3.80–5.10)
RDW: 12.6 % (ref 11.0–15.0)
TOTAL LYMPHOCYTE: 30.3 %
WBC mixed population: 471 cells/uL (ref 200–950)
WBC: 3.8 10*3/uL (ref 3.8–10.8)

## 2017-12-25 NOTE — Patient Instructions (Addendum)
Thank you for coming to the office today.  Blood work ordered today fasting  - Includes labs from request of your Dermatologist Dr Roseanne Reno - CBC w/ Diff, CMP, Lipid Panel, and Quantiferon TB test - stay tuned for result, we can share some of them with you when ready otherwise will fax all results to them may take >1 week for TB  Other tests - Vitamin B12 and Vitamin D  For now continue Vitamin B12 daily, and Vitamin D3 2,000 iu daily maintenance dose  Also checking A1c for Pre-Diabetes check-up   Please schedule a Follow-up Appointment to: Return in about 3 months (around 03/27/2018) for PreDM A1c, PreHTN BP check.  If you have any other questions or concerns, please feel free to call the office or send a message through MyChart. You may also schedule an earlier appointment if necessary.  Additionally, you may be receiving a survey about your experience at our office within a few days to 1 week by e-mail or mail. We value your feedback.  Saralyn Pilar, DO [facility] [place]

## 2017-12-25 NOTE — Assessment & Plan Note (Addendum)
Again initial elevated BP, on manual re-check improved No formal dx HTN. Risk factors with morbid obesity and age. Concern with history of mild OSA not on CPAP. Also still with acute MSK pain - outside BP readings stable mostly  Plan: 1. Discussion today on progression of HTN - and concern may benefit from med in future still, reassurance with some improved readings outside office - taking HCTZ 12.5mg  daily for edema 2. Return sooner if elevated readings >140/90

## 2017-12-25 NOTE — Assessment & Plan Note (Signed)
S/p Vitamin B12 injection therapy Now due check B12 Continue Vitamin B12 oral 1,000 mcg daily maintenance

## 2017-12-25 NOTE — Progress Notes (Signed)
Subjective:    Patient ID: Morgan Mcclain, female    DOB: [birth year], 67 y.o.   MRN: [id number]  Morgan Mcclain is a 67 y.o. female presenting on 12/25/2017 for Hypertension; Psoriasis; Arthritis; and Pre-Diabetes   HPI    Psoriasis, Chronic / History of Psoriatic Arthritis and OA/DJD - Followed by Dermatology, now Dr Willeen Niece at [facility], last seen 11/2017, has been treated on Taltz injections, >1 year with limited improvement on joint involvement. She has been referred to [facility] Dr Tresa Moore, seen 09/2017, and proceeded to PT/OT rehab therapy with some improvement, does still have some chronic knee and joint pain - Today requesting orders for surveillance labs from Dermatology - CBC w/ diff, CMP+LP, Quantiferon Gold TB - since she was getting blood drawn here as well - She continues to see Dermatologist every 6 months - Also had cyst on back, drained yesterday 4/30, and requested bandage change on her back, it is doing well, now on doxycycline course also Denies fall injury joint redness or swelling  Pre-HTN Not checking BP regularly outside office. No prior dx HTN. She has history of borderline elevated BP before. - Taking HCTZ 12.5mg  daily Denies CP, dyspnea, HA, edema, dizziness / lightheadedness  Vitamin D deficiency - Past readings Vitamin D up to 17 last check 06/2017, she was supposed to take vitamin D3 5k daily but did not finish treatment. Since was switched to Vitamin D3 50k weekly for 12 weeks, now just finished, has not resumed maintenance dose 2k daily  Vitamin B12 Deficiency See prior note for background, had prior low reading, then received vitamin B12 injections here and also advised to take oral vit b12. Now last check 10/10/17 showed vitamin b12 1144, above normal range now. She was stopped on injection therapy, and continues oral Vitamin B12 1,087mcg daily - She feels improved energy overall - Due for repeat lab check  today  Pre-Diabetes / HYPERLIPIDEMIA / MORBID OBESITY BMI >42 - Last Lab A1c 5.8 up from 5.7. Due for labs today and A1c check. - Reports no new concerns. Last lipid panel 06/2017, elevated LDL - Never on Statin cholesterol med - Taking ASA  daily Lifestyle - Weight down 4 lbs in 5 months - Diet: Still improving diet, reduced portion size - Exercise: goal to start more regular walking and exercise still somewhat limited by knees Denies hypoglycemia, numbness tingling weakness   Depression screen [facility] 2/9 12/25/2017 07/31/2017 07/30/2017  Decreased Interest 0 0 0  Down, Depressed, Hopeless 0 0 0  PHQ - 2 Score 0 0 0  Altered sleeping - - 1  Tired, decreased energy - - 1  Change in appetite - - 1  Feeling bad or failure about yourself  - - 0  Trouble concentrating - - 0  Moving slowly or fidgety/restless - - 0  Suicidal thoughts - - 0  PHQ-9 Score - - 3  Difficult doing work/chores - - Not difficult at all    Social History   Tobacco Use  . Smoking status: Never Smoker  . Smokeless tobacco: Never Used  Substance Use Topics  . Alcohol use: No  . Drug use: No    Review of Systems Per HPI unless specifically indicated above     Objective:    BP 130/70 (BP Location: Left Arm, Cuff Size: Large)   Pulse (!) 55   Temp 97.6 F (36.4 C) (Oral)   Resp 16   Ht 5' 3.5" (1.613 m)  Wt 245 lb (111.1 kg)   BMI 42.72 kg/m   Wt Readings from Last 3 Encounters:  12/25/17 245 lb (111.1 kg)  07/31/17 249 lb (112.9 kg)  07/30/17 251 lb 6.4 oz (114 kg)    Physical Exam  Constitutional: She is oriented to person, place, and time. She appears well-developed and well-nourished. No distress.  Well-appearing, comfortable, cooperative, obese  HENT:  Head: Normocephalic and atraumatic.  Mouth/Throat: Oropharynx is clear and moist.  Eyes: Conjunctivae are normal. Right eye exhibits no discharge. Left eye exhibits no discharge.  Neck: Normal range of motion. Neck supple. No  thyromegaly present.  Cardiovascular: Regular rhythm, normal heart sounds and intact distal pulses.  No murmur heard. Mild bradycardic  Pulmonary/Chest: Effort normal and breath sounds normal. No respiratory distress. She has no wheezes. She has no rales.  Musculoskeletal: She exhibits no edema.  Lymphadenopathy:    She has no cervical adenopathy.  Neurological: She is alert and oriented to person, place, and time.  Skin: Skin is warm and dry. No rash noted. She is not diaphoretic. No erythema.  Psychiatric: She has a normal mood and affect. Her behavior is normal.  Well groomed, good eye contact, normal speech and thoughts  Nursing note and vitals reviewed.  Results for orders placed or performed in visit on 10/10/17  Vitamin B12  Result Value Ref Range   Vitamin B-12 1,144 (H) 200 - 1,100 pg/mL      Assessment & Plan:   Problem List Items Addressed This Visit    Hyperlipidemia    Due lipids today fasting will re-check Previously uncontrolled lipids, last 06/2017 Calculated ASCVD 10 yr risk score >6.5% previously  Plan: 1. Continue ASA 81 2. Encourage improved lifestyle - low carb/cholesterol, reduce portion size, continue improving regular exercise      Relevant Orders   Lipid panel   COMPLETE METABOLIC PANEL WITH GFR   Morbid obesity with BMI of 40.0-44.9, adult (HCC)    Some weight loss still 4 lbs in 5 months, keep improving lifestyle Concern with Pre-HTN, likely inc risk progression to HTN. Concern for future worsening OSA as well, however negative screening history now. - Pre-DM, HLD  Plan: 1. Emphasized importance of lifestyle changes diet, low sodium, significantly reduce soda intake inc water, smaller portions 2 Follow-up 3 months - reconsider again Weight Management referral, medications other options      Relevant Orders   Lipid panel   Pre-diabetes - Primary    Due re-check A1c today, previously stable Pre-DM with A1c 5.8 from prior 5.7 Concern with  morbid obesity, Pre HTN, HLD  Plan:  1. Not on any therapy currently - remain off 2. Encourage improved lifestyle - low carb, low sugar diet, reduce portion size, continue improving regular exercise 3. Follow-up 3 months PreDM A1c      Relevant Orders   Hemoglobin A1c   Pre-hypertension    Again initial elevated BP, on manual re-check improved No formal dx HTN. Risk factors with morbid obesity and age. Concern with history of mild OSA not on CPAP. Also still with acute MSK pain - outside BP readings stable mostly  Plan: 1. Discussion today on progression of HTN - and concern may benefit from med in future still, reassurance with some improved readings outside office - taking HCTZ 12.5mg  daily for edema 2. Return sooner if elevated readings >140/90      Psoriasis with arthropathy (HCC)    Stable chronic problem, controlled on Taltz therapy from Derm [facility] has  chronic underlying arthritis secondary chronic joint pain - Derm = [facility] Dr Roseanne Reno - Rheum=[facility] Dr Tresa Moore - On Taltz injection >1 yr for immune-modulating therapy for resolving plaque psoriasis skin, limited joint improvement  Check labs today as requested by Derm on Altamease [place] will fax results once available      Relevant Orders   COMPLETE METABOLIC PANEL WITH GFR   QuantiFERON-TB Gold Plus   CBC with Differential/Platelet   Vitamin B12 deficiency    S/p Vitamin B12 injection therapy Now due check B12 Continue Vitamin B12 oral 1,000 mcg daily maintenance      Relevant Orders   Vitamin B12   Vitamin D deficiency    Check Vitamin D lab today. Now s/p 50k weekly for 12 weeks Previous 17, did not complete 5k daily therapy Go ahead and start Vitamin D3 2000 iu daily maintenance      Relevant Orders   VITAMIN D 25 Hydroxy (Vit-D Deficiency, Fractures)    Other Visit Diagnoses    Long-term use of high-risk medication       Relevant Orders   Lipid panel   COMPLETE METABOLIC PANEL WITH GFR    QuantiFERON-TB Gold Plus   CBC with Differential/Platelet   Hemoglobin A1c      No orders of the defined types were placed in this encounter.   Follow up plan: Return in about 3 months (around 03/27/2018) for PreDM A1c, PreHTN BP check.  Labs ordered today at request of patient's Dermatologist Dr Willeen Niece [facility]) for monitoring for continuing Taltz therapy for Psoriasis, see HPI. Will fax results of CBC, CMP, Lipid, Quantiferon TB test to Dr Roseanne Reno when results available.  Saralyn Pilar, DO [facility] 12/25/2017, 1:48 PM

## 2017-12-25 NOTE — Assessment & Plan Note (Signed)
Stable chronic problem, controlled on Taltz therapy from Derm [facility] has chronic underlying arthritis secondary chronic joint pain - Derm = [facility] Dr Roseanne Reno - Rheum=[facility] Dr Tresa Moore - On Taltz injection >1 yr for immune-modulating therapy for resolving plaque psoriasis skin, limited joint improvement  Check labs today as requested by Derm on Altamease [place] will fax results once available

## 2017-12-25 NOTE — Assessment & Plan Note (Signed)
Check Vitamin D lab today. Now s/p 50k weekly for 12 weeks Previous 17, did not complete 5k daily therapy Go ahead and start Vitamin D3 2000 iu daily maintenance

## 2017-12-25 NOTE — Assessment & Plan Note (Signed)
Due lipids today fasting will re-check Previously uncontrolled lipids, last 06/2017 Calculated ASCVD 10 yr risk score >6.5% previously  Plan: 1. Continue ASA 81 2. Encourage improved lifestyle - low carb/cholesterol, reduce portion size, continue improving regular exercise

## 2017-12-25 NOTE — Assessment & Plan Note (Signed)
Due re-check A1c today, previously stable Pre-DM with A1c 5.8 from prior 5.7 Concern with morbid obesity, Pre HTN, HLD  Plan:  1. Not on any therapy currently - remain off 2. Encourage improved lifestyle - low carb, low sugar diet, reduce portion size, continue improving regular exercise 3. Follow-up 3 months PreDM A1c

## 2017-12-25 NOTE — Assessment & Plan Note (Signed)
Some weight loss still 4 lbs in 5 months, keep improving lifestyle Concern with Pre-HTN, likely inc risk progression to HTN. Concern for future worsening OSA as well, however negative screening history now. - Pre-DM, HLD  Plan: 1. Emphasized importance of lifestyle changes diet, low sodium, significantly reduce soda intake inc water, smaller portions 2 Follow-up 3 months - reconsider again Weight Management referral, medications other options

## 2017-12-26 LAB — VITAMIN D 25 HYDROXY (VIT D DEFICIENCY, FRACTURES): Vit D, 25-Hydroxy: 27 ng/mL — ABNORMAL LOW (ref 30–100)

## 2017-12-26 LAB — COMPLETE METABOLIC PANEL WITH GFR
AG Ratio: 1.2 (calc) (ref 1.0–2.5)
ALT: 8 U/L (ref 6–29)
AST: 16 U/L (ref 10–35)
Albumin: 4.2 g/dL (ref 3.6–5.1)
Alkaline phosphatase (APISO): 103 U/L (ref 33–130)
BUN: 14 mg/dL (ref 7–25)
CALCIUM: 9.2 mg/dL (ref 8.6–10.4)
CO2: 29 mmol/L (ref 20–32)
CREATININE: 0.74 mg/dL (ref 0.50–0.99)
Chloride: 104 mmol/L (ref 98–110)
GFR, EST NON AFRICAN AMERICAN: 84 mL/min/{1.73_m2} (ref >=60)
GFR, Est African American: 98 mL/min/{1.73_m2} (ref >=60)
GLUCOSE: 94 mg/dL (ref 65–99)
Globulin: 3.4 g/dL (calc) (ref 1.9–3.7)
Potassium: 4 mmol/L (ref 3.5–5.3)
Sodium: 139 mmol/L (ref 135–146)
TOTAL PROTEIN: 7.6 g/dL (ref 6.1–8.1)
Total Bilirubin: 0.3 mg/dL (ref 0.2–1.2)

## 2017-12-26 LAB — HEMOGLOBIN A1C
Hgb A1c MFr Bld: 5.9 %{Hb} — ABNORMAL HIGH (ref <5.7)
Mean Plasma Glucose: 123 (calc)
eAG (mmol/L): 6.8 (calc)

## 2017-12-26 LAB — LIPID PANEL
Cholesterol: 212 mg/dL — ABNORMAL HIGH (ref <200)
HDL: 50 mg/dL — ABNORMAL LOW (ref >50)
LDL Cholesterol (Calc): 147 mg/dL (calc) — ABNORMAL HIGH
NON-HDL CHOLESTEROL (CALC): 162 mg/dL — AB (ref <130)
Total CHOL/HDL Ratio: 4.2 (calc) (ref <5.0)
Triglycerides: 55 mg/dL (ref <150)

## 2017-12-26 LAB — VITAMIN B12: Vitamin B-12: 1091 pg/mL (ref 200–1100)

## 2017-12-27 LAB — QUANTIFERON-TB GOLD PLUS
MITOGEN-NIL: 4.62 [IU]/mL
NIL: 0.02 IU/mL
QuantiFERON-TB Gold Plus: NEGATIVE
TB1-NIL: <0 IU/mL
TB2-NIL: 0 IU/mL

## 2017-12-30 ENCOUNTER — Ambulatory Visit: Payer: Medicare Other | Attending: *Deleted

## 2017-12-30 DIAGNOSIS — R262 Difficulty in walking, not elsewhere classified: Secondary | ICD-10-CM | POA: Insufficient documentation

## 2017-12-30 DIAGNOSIS — R2689 Other abnormalities of gait and mobility: Secondary | ICD-10-CM | POA: Insufficient documentation

## 2017-12-30 DIAGNOSIS — I890 Lymphedema, not elsewhere classified: Secondary | ICD-10-CM | POA: Diagnosis present

## 2017-12-30 NOTE — Therapy (Signed)
[place] [facility] MAIN [facility] SERVICES [address] [place], [place], [address] Phone: [phone]   Fax:  [phone]  Physical Therapy Treatment  Patient Details  Name: Morgan Mcclain MRN: [id number] Date of Birth: Dec 28, 1950 Referring Provider: Wyona Almas, MD   Encounter Date: 12/30/2017    Past Medical History:  Diagnosis Date  . Arthritis   . Left ureteral calculus   . Psoriasis SEVERE--  RECEIVES LASE TX'S TWICE WEEKLY  . Renal calculus, left   . Sleep apnea 02/23/2015   No longer on CPAP    Past Surgical History:  Procedure Laterality Date  . ABDOMINAL HYSTERECTOMY  1994   W/ UNILATERAL SALPINGOOPHORECTOMY  . COLONOSCOPY WITH PROPOFOL N/A 03/04/2015   Procedure: COLONOSCOPY WITH PROPOFOL;  Surgeon: Midge Minium, MD;  Location: [facility];  Service: Endoscopy;  Laterality: N/A;  . CYSTOSCOPY WITH RETROGRADE PYELOGRAM, URETEROSCOPY AND STENT PLACEMENT Left 12/25/2012   Procedure: CYSTOSCOPY WITH RETROGRADE PYELOGRAM, URETEROSCOPY AND STENT PLACEMENT;  Surgeon: Sebastian Ache, MD;  Location: [facility];  Service: Urology;  Laterality: Left;  90 MIN NEEDS DIG URETEROSCOPE   . HOLMIUM LASER APPLICATION Left 12/25/2012   Procedure: HOLMIUM LASER APPLICATION;  Surgeon: Sebastian Ache, MD;  Location: [facility];  Service: Urology;  Laterality: Left;  . TONSILLECTOMY  AGE 51    There were no vitals filed for this visit.    Nustep Level 4 4 minutes    Resisted walking #7.5 forward, backwards, side and side x 3 trials each direction   Seated leg press: Single Leg at a time: #75 2x10 per leg   Airex pad: eyes closed 3x60 seconds with sway.   Side stepping on airex balance beam    #5 ankle weights             LAQ 2x10 cues for upright posture and core contraction              Seated marches 10x cues for upright posture       Pt. response to medical necessity: Patient will continue to benefit from  skilled physical therapy to improve gait, mobility, and decrease fall risk.             Outpatient Rehab from 12/12/2017 in [facility]  Lymphedema Life Impact Scale Total Score  36.76 %                     PT Short Term Goals - 12/05/17 1330      PT SHORT TERM GOAL #1   Title   Patient will be independent with ascend/descend 5 steps using single UE in step over step pattern without LOB.    Baseline  require BUE support     Time  2    Period  Weeks    Status  New    Target Date  12/19/17      PT SHORT TERM GOAL #2   Title  Patient will tolerate 5 seconds of single leg stance without loss of balance to improve ability to get in and out of shower safely.    Baseline  LLE 3 seconds    Time  2    Period  Weeks    Status  New    Target Date  12/19/17      PT SHORT TERM GOAL #3   Title  Patient will be modified independent in walking on even/uneven surface with least restrictive assistive device,  for 20+ minutes without rest break, reporting some difficulty or less to improve walking tolerance with community ambulation including grocery shopping, going to church,etc.     Baseline  limited in ambulation duration     Time  2    Period  Weeks    Status  New    Target Date  12/19/17      PT SHORT TERM GOAL #4   Title  Patient will increase BLE gross strength to 4+/5 as to improve functional strength for independent gait, increased standing tolerance and increased ADL ability.    Baseline  LLE 4-/5    Time  2    Period  Weeks    Status  New    Target Date  12/19/17        PT Long Term Goals - 12/05/17 1331      PT LONG TERM GOAL #1   Title   Patient will be independent with ascend/descend 12 steps using single UE in step over step pattern without LOB.    Baseline  4/11: BUE support , step to pattern     Time  8    Period  Weeks    Status  New    Target Date  01/30/18      PT LONG TERM GOAL #2   Title  Patient  will increase ABC scale score >80% to demonstrate better functional mobility and better confidence with ADLs.     Baseline  4/11: 58.1%    Time  8    Period  Weeks    Status  New    Target Date  01/30/18      PT LONG TERM GOAL #3   Title  Patient will increase dynamic gait index score to >20/24 as to demonstrate reduced fall risk and improved dynamic gait balance for better safety with community/home ambulation.     Baseline  4/11: 18/24    Time  8    Period  Weeks    Status  New    Target Date  01/30/18      PT LONG TERM GOAL #4   Title  Patient will increase lower extremity functional scale to >60/80 to demonstrate improved functional mobility and increased tolerance with ADLs.     Baseline  25/80    Time  8    Period  Weeks    Status  New    Target Date  01/30/18              Patient will benefit from skilled therapeutic intervention in order to improve the following deficits and impairments:  Abnormal gait, Cardiopulmonary status limiting activity, Decreased activity tolerance, Decreased balance, Decreased endurance, Decreased mobility, Decreased strength, Difficulty walking, Impaired flexibility, Increased edema, Improper body mechanics, Postural dysfunction  Visit Diagnosis: Other abnormalities of gait and mobility  Lymphedema, not elsewhere classified  Difficulty in walking, not elsewhere classified     Problem List Patient Active Problem List   Diagnosis Date Noted  . Vitamin B12 deficiency 09/10/2017  . Degenerative arthritis of knee, bilateral 10/15/2016  . Chronic pain of left ankle 10/15/2016  . Hyperlipidemia 10/15/2016  . Pre-diabetes 06/06/2016  . Psoriasis with arthropathy (HCC) 06/04/2016  . Morbid obesity with BMI of 40.0-44.9, adult (HCC) 06/04/2016  . Pre-hypertension 06/04/2016  . Vitamin D deficiency 06/04/2016  . Fatigue 06/04/2016  . Bilateral lower extremity edema 06/04/2016  . Healthcare maintenance 06/04/2016  . Special screening  for malignant neoplasms, colon   . Sleep apnea 02/23/2015  Precious Bard, PT, DPT   01/01/2018, 5:31 PM  [facility] MAIN [facility] SERVICES [address] [place], [place], [address] Phone: [phone]   Fax:  [phone]  Name: CASSAUNDRA RASCH MRN: [id number] Date of Birth: 1950-09-17

## 2018-01-02 ENCOUNTER — Ambulatory Visit: Payer: Medicare Other

## 2018-01-02 DIAGNOSIS — I890 Lymphedema, not elsewhere classified: Secondary | ICD-10-CM

## 2018-01-02 DIAGNOSIS — R2689 Other abnormalities of gait and mobility: Secondary | ICD-10-CM | POA: Diagnosis not present

## 2018-01-02 DIAGNOSIS — R262 Difficulty in walking, not elsewhere classified: Secondary | ICD-10-CM
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
## 2018-01-06 ENCOUNTER — Ambulatory Visit: Payer: Medicare Other

## 2018-01-06 DIAGNOSIS — R262 Difficulty in walking, not elsewhere classified: Secondary | ICD-10-CM

## 2018-01-06 DIAGNOSIS — R2689 Other abnormalities of gait and mobility: Secondary | ICD-10-CM | POA: Diagnosis not present

## 2018-01-06 DIAGNOSIS — I890 Lymphedema, not elsewhere classified: Secondary | ICD-10-CM

## 2018-01-06 NOTE — Therapy (Signed)
[place] [facility] MAIN [facility] SERVICES [address] [place], [place], [address] Phone: [phone]   Fax:  [phone]  Physical Therapy Treatment  Patient Details  Name: LAKEASHA PETION MRN: [id number] Date of Birth: 12-19-1950 Referring Provider: Wyona Almas, MD   Encounter Date: 01/06/2018  PT End of Session - 01/06/18 1042    Visit Number  8    Number of Visits  16    Authorization Type  8/10    PT Start Time  1035    PT Stop Time  1115    PT Time Calculation (min)  40 min    Activity Tolerance  Patient tolerated treatment well;No increased pain    Behavior During Therapy  WFL for tasks assessed/performed       Past Medical History:  Diagnosis Date  . Arthritis   . Left ureteral calculus   . Psoriasis SEVERE--  RECEIVES LASE TX'S TWICE WEEKLY  . Renal calculus, left   . Sleep apnea 02/23/2015   No longer on CPAP    Past Surgical History:  Procedure Laterality Date  . ABDOMINAL HYSTERECTOMY  1994   W/ UNILATERAL SALPINGOOPHORECTOMY  . COLONOSCOPY WITH PROPOFOL N/A 03/04/2015   Procedure: COLONOSCOPY WITH PROPOFOL;  Surgeon: Midge Minium, MD;  Location: [facility];  Service: Endoscopy;  Laterality: N/A;  . CYSTOSCOPY WITH RETROGRADE PYELOGRAM, URETEROSCOPY AND STENT PLACEMENT Left 12/25/2012   Procedure: CYSTOSCOPY WITH RETROGRADE PYELOGRAM, URETEROSCOPY AND STENT PLACEMENT;  Surgeon: Sebastian Ache, MD;  Location: [facility];  Service: Urology;  Laterality: Left;  90 MIN NEEDS DIG URETEROSCOPE   . HOLMIUM LASER APPLICATION Left 12/25/2012   Procedure: HOLMIUM LASER APPLICATION;  Surgeon: Sebastian Ache, MD;  Location: [facility];  Service: Urology;  Laterality: Left;  . TONSILLECTOMY  AGE 67    There were no vitals filed for this visit.     Seated leg press: Single Leg at a time: #75 2x15 per leg  Stand In middle of number box with PT directing which number/direction to tap leg to promote  single limb support in multiple directions.   Monster walks GTB 61ft x 2 trials with GTB      Prone hip flexor stretch 2x60 seconds.    Resisted hamstring curls 2x15 BLE cues for upright posture and core contraction.        Pt. response to medical necessity: Patient will continue to benefit from skilled physical therapy to improve gait, mobility, and decrease fall risk.              Outpatient Rehab from 12/12/2017 in [facility]  Lymphedema Life Impact Scale Total Score  36.76 %                   PT Education - 01/06/18 1041    Education provided  Yes    Education Details  exercise technique     Person(s) Educated  Patient    Methods  Explanation;Demonstration;Verbal cues    Comprehension  Verbalized understanding;Returned demonstration;Need further instruction       PT Short Term Goals - 12/05/17 1330      PT SHORT TERM GOAL #1   Title   Patient will be independent with ascend/descend 5 steps using single UE in step over step pattern without LOB.    Baseline  require BUE support     Time  2    Period  Weeks    Status  New  Target Date  12/19/17      PT SHORT TERM GOAL #2   Title  Patient will tolerate 5 seconds of single leg stance without loss of balance to improve ability to get in and out of shower safely.    Baseline  LLE 3 seconds    Time  2    Period  Weeks    Status  New    Target Date  12/19/17      PT SHORT TERM GOAL #3   Title  Patient will be modified independent in walking on even/uneven surface with least restrictive assistive device, for 20+ minutes without rest break, reporting some difficulty or less to improve walking tolerance with community ambulation including grocery shopping, going to church,etc.     Baseline  limited in ambulation duration     Time  2    Period  Weeks    Status  New    Target Date  12/19/17      PT SHORT TERM GOAL #4   Title  Patient will increase BLE gross  strength to 4+/5 as to improve functional strength for independent gait, increased standing tolerance and increased ADL ability.    Baseline  LLE 4-/5    Time  2    Period  Weeks    Status  New    Target Date  12/19/17        PT Long Term Goals - 12/05/17 1331      PT LONG TERM GOAL #1   Title   Patient will be independent with ascend/descend 12 steps using single UE in step over step pattern without LOB.    Baseline  4/11: BUE support , step to pattern     Time  8    Period  Weeks    Status  New    Target Date  01/30/18      PT LONG TERM GOAL #2   Title  Patient will increase ABC scale score >80% to demonstrate better functional mobility and better confidence with ADLs.     Baseline  4/11: 58.1%    Time  8    Period  Weeks    Status  New    Target Date  01/30/18      PT LONG TERM GOAL #3   Title  Patient will increase dynamic gait index score to >20/24 as to demonstrate reduced fall risk and improved dynamic gait balance for better safety with community/home ambulation.     Baseline  4/11: 18/24    Time  8    Period  Weeks    Status  New    Target Date  01/30/18      PT LONG TERM GOAL #4   Title  Patient will increase lower extremity functional scale to >60/80 to demonstrate improved functional mobility and increased tolerance with ADLs.     Baseline  25/80    Time  8    Period  Weeks    Status  New    Target Date  01/30/18            Plan - 01/06/18 1043    Clinical Impression Statement  Patient demonstrates improved capacity for muscle activation with improved reps in leg press intervention. Improved control of muscle activation noted with decreased compensatory movements. Balance is challenged by narrow BOS. Patient will continue to benefit from skilled physical therapy to improve gait, mobility, and decrease fall risk    Rehab Potential  Fair  Clinical Impairments Affecting Rehab Potential  (+) age, family support (-) lymphedema     PT Frequency  2x /  week    PT Duration  8 weeks    PT Treatment/Interventions  ADLs/Self Care Home Management;Cryotherapy;Ultrasound;Traction;DME Instruction;Gait training;Stair training;Functional mobility training;Neuromuscular re-education;Balance training;Therapeutic exercise;Therapeutic activities;Patient/family education;Passive range of motion;Manual techniques;Energy conservation;Taping    PT Next Visit Plan  hamstring stretch, static and dynamic balance, weight shift, stair negotiation , HEP program creation     PT Home Exercise Plan  Continue to progress dynamic balance activity    Consulted and Agree with Plan of Care  Patient       Patient will benefit from skilled therapeutic intervention in order to improve the following deficits and impairments:  Abnormal gait, Cardiopulmonary status limiting activity, Decreased activity tolerance, Decreased balance, Decreased endurance, Decreased mobility, Decreased strength, Difficulty walking, Impaired flexibility, Increased edema, Improper body mechanics, Postural dysfunction  Visit Diagnosis: Other abnormalities of gait and mobility  Lymphedema, not elsewhere classified  Difficulty in walking, not elsewhere classified     Problem List Patient Active Problem List   Diagnosis Date Noted  . Vitamin B12 deficiency 09/10/2017  . Degenerative arthritis of knee, bilateral 10/15/2016  . Chronic pain of left ankle 10/15/2016  . Hyperlipidemia 10/15/2016  . Pre-diabetes 06/06/2016  . Psoriasis with arthropathy (HCC) 06/04/2016  . Morbid obesity with BMI of 40.0-44.9, adult (HCC) 06/04/2016  . Pre-hypertension 06/04/2016  . Vitamin D deficiency 06/04/2016  . Fatigue 06/04/2016  . Bilateral lower extremity edema 06/04/2016  . Healthcare maintenance 06/04/2016  . Special screening for malignant neoplasms, colon   . Sleep apnea 02/23/2015  Precious Bard, PT, DPT   01/06/2018, 12:56 PM  [place] [facility] MAIN [facility]  SERVICES [address] [place], [place], [address] Phone: [phone]   Fax:  [phone]  Name: TESHIA MAHONE MRN: [id number] Date of Birth: [birth year]

## 2018-01-09 ENCOUNTER — Ambulatory Visit: Payer: Medicare Other

## 2018-01-09 ENCOUNTER — Ambulatory Visit: Payer: Medicare Other | Admitting: Occupational Therapy

## 2018-01-09 DIAGNOSIS — R2689 Other abnormalities of gait and mobility: Secondary | ICD-10-CM | POA: Diagnosis not present

## 2018-01-09 DIAGNOSIS — R262 Difficulty in walking, not elsewhere classified: Secondary | ICD-10-CM

## 2018-01-09 DIAGNOSIS — I890 Lymphedema, not elsewhere classified: Secondary | ICD-10-CM

## 2018-01-09 NOTE — Patient Instructions (Signed)
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
## 2018-01-17 ENCOUNTER — Encounter: Payer: Self-pay | Admitting: Physical Therapy

## 2018-01-17 ENCOUNTER — Ambulatory Visit: Payer: Medicare Other | Admitting: Physical Therapy

## 2018-01-17 DIAGNOSIS — R262 Difficulty in walking, not elsewhere classified: Secondary | ICD-10-CM

## 2018-01-17 DIAGNOSIS — R2689 Other abnormalities of gait and mobility: Secondary | ICD-10-CM
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
## 2018-01-22 ENCOUNTER — Ambulatory Visit: Payer: Medicare Other

## 2018-01-22 DIAGNOSIS — R2689 Other abnormalities of gait and mobility: Secondary | ICD-10-CM

## 2018-01-22 DIAGNOSIS — R262 Difficulty in walking, not elsewhere classified: Secondary | ICD-10-CM
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
## 2018-01-27 ENCOUNTER — Ambulatory Visit: Payer: Medicare Other | Attending: Internal Medicine

## 2018-01-27 DIAGNOSIS — R262 Difficulty in walking, not elsewhere classified: Secondary | ICD-10-CM | POA: Diagnosis present

## 2018-01-27 DIAGNOSIS — R2689 Other abnormalities of gait and mobility: Secondary | ICD-10-CM | POA: Diagnosis not present
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
## 2018-03-06 ENCOUNTER — Encounter: Payer: Self-pay | Admitting: Family Medicine

## 2018-03-06 NOTE — Progress Notes (Signed)
Completed renew handicap placard form for patient today, 03/06/18, previously 6 month temporary placard. Given her chronic psoriatic arthritis, written for disability parking placard for up to 5 years, then renewal, criteria is cannot walk >200 ft w/o stopping to rest and severely limited by orthopedic condition. Patient will pick up form.  Saralyn PilarAlexander Siddharth Babington, DO [facility] 03/06/2018, 12:04 PM

## 2018-04-15 ENCOUNTER — Other Ambulatory Visit: Payer: Self-pay | Admitting: Family Medicine

## 2018-04-15 ENCOUNTER — Ambulatory Visit (INDEPENDENT_AMBULATORY_CARE_PROVIDER_SITE_OTHER): Payer: Medicare Other | Admitting: Family Medicine

## 2018-04-15 ENCOUNTER — Encounter: Payer: Self-pay | Admitting: Family Medicine

## 2018-04-15 VITALS — BP 132/82 | HR 50 | Temp 97.9°F | Resp 16 | Ht 63.0 in | Wt 249.6 lb

## 2018-04-15 DIAGNOSIS — R030 Elevated blood-pressure reading, without diagnosis of hypertension: Secondary | ICD-10-CM | POA: Diagnosis not present

## 2018-04-15 DIAGNOSIS — Z6841 Body Mass Index (BMI) 40.0 and over, adult: Secondary | ICD-10-CM

## 2018-04-15 DIAGNOSIS — R7303 Prediabetes: Secondary | ICD-10-CM

## 2018-04-15 DIAGNOSIS — L4050 Arthropathic psoriasis, unspecified: Secondary | ICD-10-CM

## 2018-04-15 DIAGNOSIS — Z0000 Encounter for general adult medical examination without abnormal findings: Secondary | ICD-10-CM

## 2018-04-15 DIAGNOSIS — S31809A Unspecified open wound of unspecified buttock, initial encounter: Secondary | ICD-10-CM | POA: Insufficient documentation

## 2018-04-15 DIAGNOSIS — E559 Vitamin D deficiency, unspecified: Secondary | ICD-10-CM

## 2018-04-15 DIAGNOSIS — E782 Mixed hyperlipidemia: Secondary | ICD-10-CM

## 2018-04-15 DIAGNOSIS — Z79899 Other long term (current) drug therapy: Secondary | ICD-10-CM

## 2018-04-15 DIAGNOSIS — R635 Abnormal weight gain: Secondary | ICD-10-CM

## 2018-04-15 LAB — POCT GLYCOSYLATED HEMOGLOBIN (HGB A1C): HEMOGLOBIN A1C: 5.6 % (ref 4.0–5.6)

## 2018-04-15 MED ORDER — MUPIROCIN CALCIUM 2 % EX CREA: 1.0000 "application " | TOPICAL_CREAM | Freq: Two times a day (BID) | CUTANEOUS | 0 refills | Status: DC

## 2018-04-15 NOTE — Assessment & Plan Note (Signed)
Again initial elevated BP, on manual re-check improved - outside reading was normal No formal dx HTN. Risk factors with morbid obesity and age. Concern with history of mild OSA not on CPAP.    Plan: 1. Discussion today on progression of HTN - and concern may benefit from med in future still, reassurance with some improved readings outside office - Needs to check BP outside office at home 1-2x weekly and record readings - taking HCTZ 12.5mg  daily for edema - continue 2. Return sooner if elevated readings >140/90  Follow-up 3-4 months may need to inc HCTZ or add 2nd agent and add dx HTN

## 2018-04-15 NOTE — Assessment & Plan Note (Signed)
Elevated weight again in past several months, still improving lifestyle Concern with Pre-HTN, likely inc risk progression to HTN. Concern for future worsening OSA as well, however negative screening at last check - Pre-DM, HLD  Plan: 1. Emphasized importance of lifestyle changes diet, low sodium, significantly reduce soda intake inc water, smaller portions 2 Follow-up 3-4 months - reconsider again Weight Management referral, medications other options

## 2018-04-15 NOTE — Patient Instructions (Addendum)
Thank you for coming to the office today.  BP was mildly elevated, on re-check it is better.  Continue same medicine. If you can double check your BP about twice a week - write down numbers, if persistently >140/90, then call office sooner, may need to adjust med or add one other low dose.  A1c today 5.6, great job - we will re-check in 3-4 months  For Gluteal skin issue - it looks like it may be due to psoriasis, seems like a longer term issue with inflamed skin - I would start with topical antibiotic sent to pharmacy, it does not look infected this is more of a precaution  - Need High Dose Flu Vaccine can get in December or sooner   DUE for FASTING BLOOD WORK (no food or drink after midnight before the lab appointment, only water or coffee without cream/sugar on the morning of)  SCHEDULED ALREADY  For Lab Results, once available within 2-3 days of blood draw, you can can log in to MyChart online to view your results and a brief explanation. Also, we can discuss results at next follow-up visit.  Please schedule a Follow-up Appointment to: Return in about 4 months (around 08/15/2018) for keep apt as scheduled in December 2019.  If you have any other questions or concerns, please feel free to call the office or send a message through MyChart. You may also schedule an earlier appointment if necessary.  Additionally, you may be receiving a survey about your experience at our office within a few days to 1 week by e-mail or mail. We value your feedback.  Saralyn PilarAlexander Benjaman Artman, DO [facility]

## 2018-04-15 NOTE — Assessment & Plan Note (Signed)
Improved Vit D now on last lab Continue daily 2k maintenance VIt D3

## 2018-04-15 NOTE — Assessment & Plan Note (Signed)
Stable chronic problem, controlled on Taltz therapy from [facility] has chronic underlying arthritis secondary chronic joint pain - Derm = [facility] Dr Roseanne RenoStewart - Rheum=[facility] Dr Tresa MooreBehalal-Bock - On Taltz injection >1 yr for immune-modulating therapy for resolving plaque psoriasis skin, limited joint improvement  All lab monitoring results were checked in 12/2017, and faxed to Dermatology

## 2018-04-15 NOTE — Progress Notes (Signed)
Subjective:    Patient ID: Morgan Mcclain, female    DOB: 05-30-1951, 67 y.o.   MRN: [id number]  Morgan Mcclain is a 67 y.o. female presenting on 04/15/2018 for prediabetes   HPI   Psoriasis, Chronic / History of Psoriatic Arthritis and OA/DJD - Followed by Dermatology, now Dr Willeen Nieceara Stewart [facility] - Interval update, labs requested by Derm were drawn in 12/2017, these were faxed to Dermatology at that time, and she has since had Taltz injection series renewed - Also [facility] Tresa MooreBehalal-Bock, she was asking me to look at area on gluteal region to see if infection or if need to go to Derm - She continues to see Dermatologist every 6 months - Describes an area of gluteal cleft skin rash and changes, thought due to psoriasis, she used vaseline regularly without improvement Denies fall injury joint redness or swelling, drainage of pus or bleeding or pain in gluteal cleft area  Pre-HTN No prior dx of HTN confirmed, usually manual improved on re-check. Home readings were controlled. Earlier today BP reading was [facility] 122/80. - Since prior visit she has been taking HCTZ 12.5mg  daily for edema in lower extremity - with good results, inc urination Denies CP, dyspnea, HA, edema, dizziness / lightheadedness  Vitamin D deficiency Recently finished vitamin D therapy, now last check showed Vit D level 27. She is taking maintenance dose now 2k daily.  Vitamin B12 Deficiency See prior note for background, had prior low reading, then received vitamin B12 injections here and also advised to take oral vit b12. Now last check 10/10/17 showed vitamin b12 1144, above normal range now. She was stopped on injection therapy, and continues oral Vitamin B12 1,04100mcg daily - Today reviewed lab result from 12/2017 showed normal Vitamin B12 - She feels improved energy overall  Pre-Diabetes /HYPERLIPIDEMIA/ MORBID OBESITY BMI >44 Today A1c 5.6 result.  Improved from prior readings 5.7 to 5.8 Lifestyle - Weight back up +4 lbs in 3-4 months - Diet:Still improving diet, reduced portion size - Exercise:goal to start moreregular walking and exercise still Denies hypoglycemia, numbness tingling weakness   Health Maintenance: Due for high dose flu shot will return when in stock  Depression screen [facility] 2[facility] 04/15/2018 12/25/2017 07/31/2017  Decreased Interest 0 0 0  Down, Depressed, Hopeless 0 0 0  PHQ - 2 Score 0 0 0  Altered sleeping - - -  Tired, decreased energy - - -  Change in appetite - - -  Feeling bad or failure about yourself  - - -  Trouble concentrating - - -  Moving slowly or fidgety/restless - - -  Suicidal thoughts - - -  PHQ-9 Score - - -  Difficult doing work/chores - - -    Social History   Tobacco Use  . Smoking status: Never Smoker  . Smokeless tobacco: Never Used  Substance Use Topics  . Alcohol use: No  . Drug use: No    Review of Systems Per HPI unless specifically indicated above     Objective:    BP 132/82 (BP Location: Left Arm, Cuff Size: Large)   Pulse (!) 50   Temp 97.9 F (36.6 C) (Oral)   Resp 16   Ht 5\' 3"  (1.6 m)   Wt 249 lb 9.6 oz (113.2 kg)   BMI 44.21 kg/m   Wt Readings from Last 3 Encounters:  04/15/18 249 lb 9.6 oz (113.2 kg)  12/25/17  245 lb (111.1 kg)  07/31/17 249 lb (112.9 kg)    Physical Exam  Constitutional: She is oriented to person, place, and time. She appears well-developed and well-nourished. No distress.  Well-appearing, comfortable, cooperative, morbid obesity  HENT:  Head: Normocephalic and atraumatic.  Mouth/Throat: Oropharynx is clear and moist.  Eyes: Conjunctivae are normal. Right eye exhibits no discharge. Left eye exhibits no discharge.  Neck: Normal range of motion. Neck supple. No thyromegaly present.  Cardiovascular: Normal rate, regular rhythm, normal heart sounds and intact distal pulses.  No murmur heard. Pulmonary/Chest: Effort normal and breath  sounds normal. No respiratory distress. She has no wheezes. She has no rales.  Musculoskeletal: Normal range of motion. She exhibits no edema.  Lymphadenopathy:    She has no cervical adenopathy.  Neurological: She is alert and oriented to person, place, and time.  Skin: Skin is warm and dry. She is not diaphoretic. No erythema.  Gluteal Exam - chaperoned by Elvina Mattes CMA  Large area within upper aspect of gluteal cleft of superficial abnormal skin appearance consistent with psoriatic plaque with some inflammatory changes, also within line of gluteal cleft upper aspect two distinct areas of some superficial skin breakdown within skin crease without purulence or drainage or abscess or swelling  Psychiatric: She has a normal mood and affect. Her behavior is normal.  Well groomed, good eye contact, normal speech and thoughts  Nursing note and vitals reviewed.  Results for orders placed or performed in visit on 04/15/18  POCT HgB A1C  Result Value Ref Range   Hemoglobin A1C 5.6 4.0 - 5.6 %   Recent Labs    07/24/17 1130 12/25/17 1026 04/15/18 1152  HGBA1C 5.8* 5.9* 5.6      Assessment & Plan:   Problem List Items Addressed This Visit    Morbid obesity with BMI of 40.0-44.9, adult (HCC)    Elevated weight again in past several months, still improving lifestyle Concern with Pre-HTN, likely inc risk progression to HTN. Concern for future worsening OSA as well, however negative screening at last check - Pre-DM, HLD  Plan: 1. Emphasized importance of lifestyle changes diet, low sodium, significantly reduce soda intake inc water, smaller portions 2 Follow-up 3-4 months - reconsider again Weight Management referral, medications other options      Pre-diabetes - Primary    Improved A1c today 5.6, from prior 5.7 to 5.9 Concern with morbid obesity, Pre HTN, HLD  Plan:  1. Not on any therapy currently - remain off 2. Encourage improved lifestyle - low carb, low sugar diet, reduce  portion size, continue improving regular exercise 3. Follow-up 3-4 months with annual and labs repeat A1c then q 6 mo  Consider GLP1 in future though for wt and sugar control      Relevant Orders   POCT HgB A1C (Completed)   Pre-hypertension    Again initial elevated BP, on manual re-check improved - outside reading was normal No formal dx HTN. Risk factors with morbid obesity and age. Concern with history of mild OSA not on CPAP.    Plan: 1. Discussion today on progression of HTN - and concern may benefit from med in future still, reassurance with some improved readings outside office - Needs to check BP outside office at home 1-2x weekly and record readings - taking HCTZ 12.5mg  daily for edema - continue 2. Return sooner if elevated readings >140/90  Follow-up 3-4 months may need to inc HCTZ or add 2nd agent and add dx HTN  Psoriasis with arthropathy (HCC)    Stable chronic problem, controlled on Taltz therapy from [facility] has chronic underlying arthritis secondary chronic joint pain - Derm = [facility] Dr Roseanne RenoStewart - Rheum=[facility] Dr Tresa MooreBehalal-Bock - On Taltz injection >1 yr for immune-modulating therapy for resolving plaque psoriasis skin, limited joint improvement  All lab monitoring results were checked in 12/2017, and faxed to Dermatology      Vitamin D deficiency    Improved Vit D now on last lab Continue daily 2k maintenance VIt D3       Other Visit Diagnoses    Wound of gluteal cleft, unspecified laterality, initial encounter       Relevant Medications   mupirocin cream (BACTROBAN) 2 %    Wound is most consistent with possible chronic psoriasis changes to skin with more chronic inflammatory appearance and more uniform well defined plaque area. Not consistent with secondary infection or abscess. - Trial with topical antibiotic cream for prevention given area of slight break in skin within cleft - Ultimately advised should return to Dermatology to have  this treated further - Return precautions reviewed    Meds ordered this encounter  Medications  . mupirocin cream (BACTROBAN) 2 %    Sig: Apply 1 application topically 2 (two) times daily. For 1-2 weeks. Use only in gluteal area.    Dispense:  30 g    Refill:  0    Follow up plan: Return in about 4 months (around 08/15/2018) for keep apt as scheduled in December 2019.  Future labs ordered for 07/29/18  Saralyn PilarAlexander Capria Cartaya, DO [facility] 04/15/2018, 1:34 PM

## 2018-04-15 NOTE — Assessment & Plan Note (Addendum)
Improved A1c today 5.6, from prior 5.7 to 5.9 Concern with morbid obesity, Pre HTN, HLD  Plan:  1. Not on any therapy currently - remain off 2. Encourage improved lifestyle - low carb, low sugar diet, reduce portion size, continue improving regular exercise 3. Follow-up 3-4 months with annual and labs repeat A1c then q 6 mo  Consider GLP1 in future though for wt and sugar control

## 2018-07-29 ENCOUNTER — Other Ambulatory Visit: Payer: Medicare Other

## 2018-07-29 ENCOUNTER — Ambulatory Visit (INDEPENDENT_AMBULATORY_CARE_PROVIDER_SITE_OTHER): Payer: Medicare Other

## 2018-07-29 ENCOUNTER — Telehealth: Payer: Self-pay

## 2018-07-29 DIAGNOSIS — L4050 Arthropathic psoriasis, unspecified: Secondary | ICD-10-CM

## 2018-07-29 DIAGNOSIS — Z0000 Encounter for general adult medical examination without abnormal findings: Secondary | ICD-10-CM

## 2018-07-29 DIAGNOSIS — Z23 Encounter for immunization: Secondary | ICD-10-CM

## 2018-07-29 DIAGNOSIS — Z6841 Body Mass Index (BMI) 40.0 and over, adult: Principal | ICD-10-CM

## 2018-07-29 DIAGNOSIS — Z79899 Other long term (current) drug therapy: Secondary | ICD-10-CM

## 2018-07-29 DIAGNOSIS — R635 Abnormal weight gain: Secondary | ICD-10-CM

## 2018-07-29 DIAGNOSIS — E782 Mixed hyperlipidemia: Secondary | ICD-10-CM

## 2018-07-29 DIAGNOSIS — R030 Elevated blood-pressure reading, without diagnosis of hypertension: Secondary | ICD-10-CM

## 2018-07-29 DIAGNOSIS — R7303 Prediabetes: Secondary | ICD-10-CM

## 2018-07-29 NOTE — Telephone Encounter (Signed)
Open in error

## 2018-07-30 LAB — T4, FREE: Free T4: 1.1 ng/dL (ref 0.8–1.8)

## 2018-07-30 LAB — CBC WITH DIFFERENTIAL/PLATELET
BASOS ABS: 41 {cells}/uL (ref 0–200)
Basophils Relative: 0.8 %
EOS ABS: 168 {cells}/uL (ref 15–500)
Eosinophils Relative: 3.3 %
HCT: 40.8 % (ref 35.0–45.0)
Hemoglobin: 13.9 g/dL (ref 11.7–15.5)
Lymphs Abs: 1433 cells/uL (ref 850–3900)
MCH: 31.7 pg (ref 27.0–33.0)
MCHC: 34.1 g/dL (ref 32.0–36.0)
MCV: 93.2 fL (ref 80.0–100.0)
MONOS PCT: 11.6 %
MPV: 11.5 fL (ref 7.5–12.5)
NEUTROS PCT: 56.2 %
Neutro Abs: 2866 cells/uL (ref 1500–7800)
PLATELETS: 192 10*3/uL (ref 140–400)
RBC: 4.38 10*6/uL (ref 3.80–5.10)
RDW: 12.2 % (ref 11.0–15.0)
TOTAL LYMPHOCYTE: 28.1 %
WBC mixed population: 592 cells/uL (ref 200–950)
WBC: 5.1 10*3/uL (ref 3.8–10.8)

## 2018-07-30 LAB — COMPLETE METABOLIC PANEL WITH GFR
AG RATIO: 1.3 (calc) (ref 1.0–2.5)
ALT: 8 U/L (ref 6–29)
AST: 15 U/L (ref 10–35)
Albumin: 4.1 g/dL (ref 3.6–5.1)
Alkaline phosphatase (APISO): 96 U/L (ref 33–130)
BUN: 12 mg/dL (ref 7–25)
CALCIUM: 9.3 mg/dL (ref 8.6–10.4)
CO2: 27 mmol/L (ref 20–32)
Chloride: 104 mmol/L (ref 98–110)
Creat: 0.76 mg/dL (ref 0.50–0.99)
GFR, EST AFRICAN AMERICAN: 94 mL/min/{1.73_m2} (ref >=60)
GFR, EST NON AFRICAN AMERICAN: 81 mL/min/{1.73_m2} (ref >=60)
Globulin: 3.1 g/dL (calc) (ref 1.9–3.7)
Glucose, Bld: 90 mg/dL (ref 65–99)
POTASSIUM: 4.3 mmol/L (ref 3.5–5.3)
Sodium: 138 mmol/L (ref 135–146)
TOTAL PROTEIN: 7.2 g/dL (ref 6.1–8.1)
Total Bilirubin: 0.4 mg/dL (ref 0.2–1.2)

## 2018-07-30 LAB — LIPID PANEL
Cholesterol: 200 mg/dL — ABNORMAL HIGH (ref <200)
HDL: 42 mg/dL — AB (ref >50)
LDL Cholesterol (Calc): 136 mg/dL (calc) — ABNORMAL HIGH
Non-HDL Cholesterol (Calc): 158 mg/dL (calc) — ABNORMAL HIGH (ref <130)
Total CHOL/HDL Ratio: 4.8 (calc) (ref <5.0)
Triglycerides: 112 mg/dL (ref <150)

## 2018-07-30 LAB — HEMOGLOBIN A1C
Hgb A1c MFr Bld: 5.8 % of total Hgb — ABNORMAL HIGH (ref <5.7)
MEAN PLASMA GLUCOSE: 120 (calc)
eAG (mmol/L): 6.6 (calc)

## 2018-07-30 LAB — TSH: TSH: 1.37 mIU/L (ref 0.40–4.50)

## 2018-08-05 ENCOUNTER — Encounter: Payer: Medicare Other | Admitting: Family Medicine

## 2018-08-05 ENCOUNTER — Ambulatory Visit (INDEPENDENT_AMBULATORY_CARE_PROVIDER_SITE_OTHER): Payer: Medicare Other

## 2018-08-05 ENCOUNTER — Encounter: Payer: Self-pay | Admitting: Family Medicine

## 2018-08-05 ENCOUNTER — Ambulatory Visit (INDEPENDENT_AMBULATORY_CARE_PROVIDER_SITE_OTHER): Payer: Medicare Other | Admitting: Family Medicine

## 2018-08-05 VITALS — BP 128/72 | HR 60 | Temp 97.8°F | Ht 63.0 in | Wt 250.2 lb

## 2018-08-05 VITALS — BP 128/72 | HR 60 | Temp 97.8°F | Resp 16 | Ht 60.0 in | Wt 250.2 lb

## 2018-08-05 DIAGNOSIS — Z6841 Body Mass Index (BMI) 40.0 and over, adult: Secondary | ICD-10-CM

## 2018-08-05 DIAGNOSIS — Z0000 Encounter for general adult medical examination without abnormal findings: Secondary | ICD-10-CM

## 2018-08-05 DIAGNOSIS — L4050 Arthropathic psoriasis, unspecified: Secondary | ICD-10-CM

## 2018-08-05 DIAGNOSIS — Z1239 Encounter for other screening for malignant neoplasm of breast: Secondary | ICD-10-CM

## 2018-08-05 DIAGNOSIS — R7303 Prediabetes: Secondary | ICD-10-CM

## 2018-08-05 DIAGNOSIS — E782 Mixed hyperlipidemia: Secondary | ICD-10-CM

## 2018-08-05 DIAGNOSIS — R030 Elevated blood-pressure reading, without diagnosis of hypertension: Secondary | ICD-10-CM

## 2018-08-05 DIAGNOSIS — M170 Bilateral primary osteoarthritis of knee: Secondary | ICD-10-CM

## 2018-08-05 NOTE — Progress Notes (Signed)
Subjective:   Morgan Mcclain is a 67 y.o. female who presents for Medicare Annual (Subsequent) preventive examination.  Review of Systems:  N/A Cardiac Risk Factors include: advanced age (>44men, >44 women);dyslipidemia;hypertension;obesity (BMI >30kg/m2)     Objective:     Vitals: BP 128/72 (BP Location: Right Arm)   Pulse 60   Temp 97.8 F (36.6 C) (Oral)   Ht 5\' 3"  (1.6 m)   Wt 250 lb 3.2 oz (113.5 kg)   BMI 44.32 kg/m   Body mass index is 44.32 kg/m.  Advanced Directives 08/05/2018 12/05/2017 09/16/2017 07/30/2017 05/18/2016 12/25/2012  Does Patient Have a Medical Advance Directive? Yes Yes Yes Yes No Patient does not have advance directive;Patient would not like information  Type of Public librarian Power of [place];Living will - - Healthcare Power of [place];Living will - -  Does patient want to make changes to medical advance directive? - - No - Patient declined - - -  Copy of Healthcare Power of Attorney in Chart? No - copy requested - - No - copy requested - -  Would patient like information on creating a medical advance directive? - - - - Yes - Educational materials given -  Pre-existing out of facility DNR order (yellow form or pink MOST form) - - - - - No    Tobacco Social History   Tobacco Use  Smoking Status Never Smoker  Smokeless Tobacco Never Used     Counseling given: Not Answered   Clinical Intake:  Pre-visit preparation completed: Yes  Pain : 0-10 Pain Score: 6  Pain Type: Other (Comment)(lymphedema) Pain Location: Foot Pain Orientation: Left, Right Pain Descriptors / Indicators: Aching, Throbbing Pain Frequency: Intermittent     Nutritional Status: BMI > 30  Obese Nutritional Risks: None Diabetes: No  How often do you need to have someone help you when you read instructions, pamphlets, or other written materials from your doctor or pharmacy?: 1 - Never  Interpreter Needed?: No  Information entered by :: [facility],  LPN  Past Medical History:  Diagnosis Date  . Arthritis   . Left ureteral calculus   . Psoriasis SEVERE--  RECEIVES LASE TX'S TWICE WEEKLY  . Renal calculus, left   . Sleep apnea 02/23/2015   No longer on CPAP   Past Surgical History:  Procedure Laterality Date  . ABDOMINAL HYSTERECTOMY  1994   W/ UNILATERAL SALPINGOOPHORECTOMY  . COLONOSCOPY WITH PROPOFOL N/A 03/04/2015   Procedure: COLONOSCOPY WITH PROPOFOL;  Surgeon: Midge Minium, MD;  [facility];  Service: Endoscopy;  Laterality: N/A;  . CYSTOSCOPY WITH RETROGRADE PYELOGRAM, URETEROSCOPY AND STENT PLACEMENT Left 12/25/2012   Procedure: CYSTOSCOPY WITH RETROGRADE PYELOGRAM, URETEROSCOPY AND STENT PLACEMENT;  Surgeon: Sebastian Ache, MD;  Location: [facility];  Service: Urology;  Laterality: Left;  90 MIN NEEDS DIG URETEROSCOPE   . HOLMIUM LASER APPLICATION Left 12/25/2012   Procedure: HOLMIUM LASER APPLICATION;  Surgeon: Sebastian Ache, MD;  Location: [facility];  Service: Urology;  Laterality: Left;  . TONSILLECTOMY  AGE 66   Family History  Problem Relation Age of Onset  . Heart failure Mother   . Arthritis Mother   . Heart attack Mother 75       Secondary CVA  . Cancer Father        lung cancer  . Heart disease Sister 24  . Breast cancer Neg Hx    Social History   Socioeconomic History  . Marital status: Married  Spouse name: Not on file  . Number of children: 2  . Years of education: Administrative Degree 6 yr  . Highest education level: Master's degree (e.g., MA, MS, MEng, MEd, MSW, MBA)  Occupational History  . Occupation: Retired - Dealer  . Financial resource strain: Not very hard  . Food insecurity:    Worry: Never true    Inability: Never true  . Transportation needs:    Medical: No    Non-medical: No  Tobacco Use  . Smoking status: Never Smoker  . Smokeless tobacco: Never Used  Substance and Sexual Activity  . Alcohol use: No  . Drug  use: No  . Sexual activity: Not on file  Lifestyle  . Physical activity:    Days per week: 0 days    Minutes per session: 0 min  . Stress: Only a little  Relationships  . Social connections:    Talks on phone: Twice a week    Gets together: More than three times a week    Attends religious service: More than 4 times per year    Active member of club or organization: Yes    Attends meetings of clubs or organizations: More than 4 times per year    Relationship status: Married  Other Topics Concern  . Not on file  Social History Narrative  . Not on file    Outpatient Encounter Medications as of 08/05/2018  Medication Sig  . aspirin EC 81 MG tablet Take 1 tablet (81 mg total) by mouth daily. (Patient taking differently: Take 81 mg by mouth daily. )  . Cholecalciferol (VITAMIN D3) 5000 units CAPS Take 1 capsule (5,000 Units total) by mouth daily. For 8 weeks, then start Vitamin D3 2,000 units daily (OTC) (Patient taking differently: Take 5,000 Units by mouth daily. For 8 weeks, then start Vitamin D3 2,000 units daily (OTC))  . hydrochlorothiazide (HYDRODIURIL) 12.5 MG tablet Take 1 tablet (12.5 mg total) by mouth daily.  . Ixekizumab (TALTZ) 80 MG/ML SOAJ Inject into the skin. Every other week for 12 weeks, then once monthly for several months, per Dermatology (Dr Roseanne Reno, [facility])  . mupirocin cream (BACTROBAN) 2 % Apply 1 application topically 2 (two) times daily. For 1-2 weeks. Use only in gluteal area.  . baclofen (LIORESAL) 10 MG tablet Take 0.5-1 tablets (5-10 mg total) by mouth 3 (three) times daily as needed for muscle spasms. (Patient not taking: Reported on 08/05/2018)  . cyanocobalamin (,VITAMIN B-12,) 1000 MCG/ML injection Inject 1 mL (1,000 mcg total) into the muscle once a week. For 4 weeks. (Patient not taking: Reported on 08/05/2018)  . doxycycline (DORYX) 100 MG EC tablet Take 100 mg by mouth 2 (two) times daily.  . meloxicam (MOBIC) 15 MG tablet Take 1  tablet (15 mg total) by mouth daily. With food. Do not take Ibuprofen, Advil, Aleve on this med. (Patient not taking: Reported on 08/05/2018)   No facility-administered encounter medications on file as of 08/05/2018.     Activities of Daily Living In your present state of health, do you have any difficulty performing the following activities: 08/05/2018  Hearing? N  Vision? N  Comment Wears readers.  Difficulty concentrating or making decisions? N  Walking or climbing stairs? Y  Comment Due to lymphedema  Dressing or bathing? N  Doing errands, shopping? N  Preparing Food and eating ? N  Using the Toilet? N  In the past six months, have you accidently leaked urine? Jeannie Fend  Comment Wears protection daily.   Do you have problems with loss of bowel control? N  Managing your Medications? N  Managing your Finances? N  Housekeeping or managing your Housekeeping? N  Some recent data might be hidden    Patient Care Team: Smitty Cords, DO as PCP - General (Family Medicine) Willeen Niece, MD (Dermatology) [name], MD as Referring Physician (Internal Medicine)    Assessment:   This is a routine wellness examination for Tatyanna.  Exercise Activities and Dietary recommendations Current Exercise Habits: The patient does not participate in regular exercise at present, Exercise limited by: Other - see comments(lymphedema)  Goals    . DIET - INCREASE WATER INTAKE     Recommend drinking at least 6-8 glasses of water a day        Fall Risk Fall Risk  08/05/2018 04/15/2018 12/25/2017 07/31/2017 07/30/2017  Falls in the past year? 0 No No No No   FALL RISK PREVENTION PERTAINING TO THE HOME:  Any stairs in or around the home WITH handrails? Yes  Home free of loose throw rugs in walkways, pet beds, electrical cords, etc? Yes  Adequate lighting in your home to reduce risk of falls? Yes   ASSISTIVE DEVICES UTILIZED TO PREVENT FALLS:  Life alert? No  Use of a cane,  walker or w/c? No  Grab bars in the bathroom? No Shower chair or bench in shower? No  Elevated toilet seat or a handicapped toilet? No    TIMED UP AND GO:  Was the test performed? No .    Depression Screen PHQ 2/9 Scores 08/05/2018 04/15/2018 12/25/2017 07/31/2017  PHQ - 2 Score 0 0 0 0  PHQ- 9 Score - - - -     Cognitive Function     6CIT Screen 08/05/2018 07/30/2017  What Year? 0 points 0 points  What month? 0 points 0 points  What time? 0 points 0 points  Count back from 20 0 points 0 points  Months in reverse 0 points 0 points  Repeat phrase 0 points 2 points  Total Score 0 2    Immunization History  Administered Date(s) Administered  . Influenza, High Dose Seasonal PF 07/27/2017, 07/29/2018  . Influenza-Unspecified 04/27/2016, 07/27/2017  . Pneumococcal Conjugate-13 07/31/2017  . Tdap 08/27/2009  . Zoster 08/27/2013    Qualifies for Shingles Vaccine? Yes  Zostavax completed 08/27/13. Due for Shingrix. Education has been provided regarding the importance of this vaccine. Pt has been advised to call insurance company to determine out of pocket expense. Advised may also receive vaccine at local pharmacy or Health Dept. Verbalized acceptance and understanding.  Tdap: Up to date  Flu Vaccine: Up to date  Pneumococcal Vaccine: Due for Pneumococcal vaccine. Does the patient want to receive this vaccine today?  No . Pt to check with pharmacy regarding Pneumovax 23 vaccine hx.   Screening Tests Health Maintenance  Topic Date Due  . TETANUS/TDAP  06/01/2026 (Originally 08/27/2021)  . MAMMOGRAM  08/23/2018  . DEXA SCAN  08/27/2023  . COLONOSCOPY  03/03/2025  . INFLUENZA VACCINE  Completed  . Hepatitis C Screening  Completed    Cancer Screenings:  Colorectal Screening: Completed 03/04/15. Repeat every 10 years.  Mammogram: Completed 08/23/16.   Bone Density: Completed 08/26/13. Results reflect NORMAL. Repeat every 10 years.   Lung Cancer Screening: (Low Dose CT  Chest recommended if Age 45-80 years, 30 pack-year currently smoking OR have quit w/in 15years.) does not qualify.    Additional Screening:  Hepatitis C Screening: Up to date  Vision Screening: Recommended annual ophthalmology exams for early detection of glaucoma and other disorders of the eye.  Dental Screening: Recommended annual dental exams for proper oral hygiene  Community Resource Referral:  CRR required this visit?  No       Plan:  I have personally reviewed and addressed the Medicare Annual Wellness questionnaire and have noted the following in the patient's chart:  A. Medical and social history B. Use of alcohol, tobacco or illicit drugs  C. Current medications and supplements D. Functional ability and status E.  Nutritional status F.  Physical activity G. Advance directives H. List of other physicians I.  Hospitalizations, surgeries, and ER visits in previous 12 months J.  Vitals K. Screenings such as hearing and vision if needed, cognitive and depression L. Referrals and appointments - none  In addition, I have reviewed and discussed with patient certain preventive protocols, quality metrics, and best practice recommendations. A written personalized care plan for preventive services as well as general preventive health recommendations were provided to patient.  See attached scanned questionnaire for additional information.   Signed,  Hyacinth MeekerMckenzie Trea Latner, LPN Nurse Health Advisor   Nurse Recommendations: Pt declined the Pneumovax 23 vaccine today. Pt states she may have had this at her local pharmacy. Pt to check records.

## 2018-08-05 NOTE — Assessment & Plan Note (Signed)
Improved BP Significant risk factors as previously documented, morbid obesity  Plan Continue monitoring BP May adjust med in future if need already on thiazide low dose

## 2018-08-05 NOTE — Progress Notes (Signed)
Subjective:    Patient ID: Morgan Mcclain, female    DOB: Nov 07, 1950, 67 y.o.   MRN: [id number]  Morgan Mcclain is a 67 y.o. female presenting on 08/05/2018 for Annual Exam  Today already had her Annual Medicare Wellness visit with Hyacinth Meeker, LPN see separate note.  HPI   Here for Annual Physical and Lab Reivew.  Psoriasis, Chronic / History of Psoriatic Arthritis and OA/DJD - Followed by [facility] Rheumatology Dr Tresa Moore / and Dermatology, DrTaraStewart at [facility]  Edema, controlled - with compression stockings  Pre-HTN No prior dx of HTN confirmed, usually manual improved on re-check. Home readings normal - Since prior visit she has been taking HCTZ 12.5mg  daily for edema in lower extremity - with good results still Admits mild edema, improved  Pre-Diabetes /HYPERLIPIDEMIA/ MORBID OBESITY BMI >48 Recent lab shows A1c up to 5.8 Lifestyle - Weight stable to up 1-2 lbs in 3-4 months - Diet:Still improving diet, reduced portion size - Exercise:goal to start moreregular walking and exercisestill Denies hypoglycemia  Health Maintenance: UTD Flu vaccine.  Breast CA Screening. Due for next mammogram, last negative 07/2016. She did not get it scheduled within past year. Due for re order and she will schedule now for screening mammo. Asymptomatic.  UTD Colonoscopy 2016   Depression screen [facility] 2/9 08/05/2018 04/15/2018 12/25/2017  Decreased Interest 0 0 0  Down, Depressed, Hopeless 0 0 0  PHQ - 2 Score 0 0 0  Altered sleeping - - -  Tired, decreased energy - - -  Change in appetite - - -  Feeling bad or failure about yourself  - - -  Trouble concentrating - - -  Moving slowly or fidgety/restless - - -  Suicidal thoughts - - -  PHQ-9 Score - - -  Difficult doing work/chores - - -    Past Medical History:  Diagnosis Date  . Arthritis   . Left ureteral calculus   . Psoriasis SEVERE--  RECEIVES LASE TX'S TWICE WEEKLY  . Renal  calculus, left   . Sleep apnea 02/23/2015   No longer on CPAP   Past Surgical History:  Procedure Laterality Date  . ABDOMINAL HYSTERECTOMY  1994   W/ UNILATERAL SALPINGOOPHORECTOMY  . COLONOSCOPY WITH PROPOFOL N/A 03/04/2015   Procedure: COLONOSCOPY WITH PROPOFOL;  Surgeon: Midge Minium, MD;  Location: [facility];  Service: Endoscopy;  Laterality: N/A;  . CYSTOSCOPY WITH RETROGRADE PYELOGRAM, URETEROSCOPY AND STENT PLACEMENT Left 12/25/2012   Procedure: CYSTOSCOPY WITH RETROGRADE PYELOGRAM, URETEROSCOPY AND STENT PLACEMENT;  Surgeon: Sebastian Ache, MD;  Location: [facility]  Service: Urology;  Laterality: Left;  90 MIN NEEDS DIG URETEROSCOPE   . HOLMIUM LASER APPLICATION Left 12/25/2012   Procedure: HOLMIUM LASER APPLICATION;  Surgeon: Sebastian Ache, MD;  Location: [facility];  Service: Urology;  Laterality: Left;  . TONSILLECTOMY  AGE 73   Social History   Socioeconomic History  . Marital status: Married    Spouse name: Not on file  . Number of children: 2  . Years of education: Administrative Degree 6 yr  . Highest education level: Master's degree (e.g., MA, MS, MEng, MEd, MSW, MBA)  Occupational History  . Occupation: Retired - Dealer  . Financial resource strain: Not very hard  . Food insecurity:    Worry: Never true    Inability: Never true  . Transportation needs:    Medical: No    Non-medical: No  Tobacco Use  .  Smoking status: Never Smoker  . Smokeless tobacco: Never Used  Substance and Sexual Activity  . Alcohol use: No  . Drug use: No  . Sexual activity: Not on file  Lifestyle  . Physical activity:    Days per week: 0 days    Minutes per session: 0 min  . Stress: Only a little  Relationships  . Social connections:    Talks on phone: Twice a week    Gets together: More than three times a week    Attends religious service: More than 4 times per year    Active member of club or organization: Yes     Attends meetings of clubs or organizations: More than 4 times per year    Relationship status: Married  . Intimate partner violence:    Fear of current or ex partner: No    Emotionally abused: No    Physically abused: No    Forced sexual activity: No  Other Topics Concern  . Not on file  Social History Narrative  . Not on file   Family History  Problem Relation Age of Onset  . Heart failure Mother   . Arthritis Mother   . Heart attack Mother [id number]       Secondary CVA  . Cancer Father        lung cancer  . Heart disease Sister 2064  . Breast cancer Neg Hx    Current Outpatient Medications on File Prior to Visit  Medication Sig  . aspirin EC 81 MG tablet Take 1 tablet (81 mg total) by mouth daily. (Patient taking differently: Take 81 mg by mouth daily. )  . baclofen (LIORESAL) 10 MG tablet Take 0.5-1 tablets (5-10 mg total) by mouth 3 (three) times daily as needed for muscle spasms.  . Cholecalciferol (VITAMIN D3) 5000 units CAPS Take 1 capsule (5,000 Units total) by mouth daily. For 8 weeks, then start Vitamin D3 2,000 units daily (OTC) (Patient taking differently: Take 5,000 Units by mouth daily. For 8 weeks, then start Vitamin D3 2,000 units daily (OTC))  . cyanocobalamin (,VITAMIN B-12,) 1000 MCG/ML injection Inject 1 mL (1,000 mcg total) into the muscle once a week. For 4 weeks.  Marland Kitchen. doxycycline (DORYX) 100 MG EC tablet Take 100 mg by mouth 2 (two) times daily.  . hydrochlorothiazide (HYDRODIURIL) 12.5 MG tablet Take 1 tablet (12.5 mg total) by mouth daily.  . Ixekizumab (TALTZ) 80 MG/ML SOAJ Inject into the skin. Every other week for 12 weeks, then once monthly for several months, per Dermatology (Dr Roseanne RenoStewart, [facility])  . meloxicam (MOBIC) 15 MG tablet Take 1 tablet (15 mg total) by mouth daily. With food. Do not take Ibuprofen, Advil, Aleve on this med.   No current facility-administered medications on file prior to visit.     Review of Systems  Constitutional:  Negative for activity change, appetite change, chills, diaphoresis, fatigue and fever.  HENT: Negative for congestion and hearing loss.   Eyes: Negative for visual disturbance.  Respiratory: Negative for apnea, cough, choking, chest tightness, shortness of breath and wheezing.   Cardiovascular: Negative for chest pain, palpitations and leg swelling.  Gastrointestinal: Negative for abdominal pain, anal bleeding, blood in stool, constipation, diarrhea, nausea and vomiting.  Endocrine: Negative for cold intolerance.  Genitourinary: Negative for difficulty urinating, dysuria, frequency, hematuria and urgency.  Musculoskeletal: Negative for arthralgias, back pain and neck pain.  Skin: Negative for rash.  Allergic/Immunologic: Negative for environmental allergies.  Neurological: Negative for dizziness, weakness,  light-headedness, numbness and headaches.  Hematological: Negative for adenopathy.  Psychiatric/Behavioral: Negative for behavioral problems, dysphoric mood and sleep disturbance. The patient is not nervous/anxious.    Per HPI unless specifically indicated above     Objective:    BP 128/72   Pulse 60   Temp 97.8 F (36.6 C) (Oral)   Resp 16   Ht 5' (1.524 m)   Wt 250 lb 3.2 oz (113.5 kg)   BMI 48.86 kg/m   Wt Readings from Last 3 Encounters:  08/05/18 250 lb 3.2 oz (113.5 kg)  08/05/18 250 lb 3.2 oz (113.5 kg)  04/15/18 249 lb 9.6 oz (113.2 kg)    Physical Exam  Constitutional: She is oriented to person, place, and time. She appears well-developed and well-nourished. No distress.  Well-appearing, comfortable, cooperative, obese  HENT:  Head: Normocephalic and atraumatic.  Mouth/Throat: Oropharynx is clear and moist.  Frontal / maxillary sinuses non-tender. Nares patent without purulence or edema. Bilateral TMs clear without erythema, effusion or bulging. Oropharynx clear without erythema, exudates, edema or asymmetry.  Eyes: Pupils are equal, round, and reactive to light.  Conjunctivae and EOM are normal. Right eye exhibits no discharge. Left eye exhibits no discharge.  Neck: Normal range of motion. Neck supple. No thyromegaly present.  Cardiovascular: Normal rate, regular rhythm, normal heart sounds and intact distal pulses.  No murmur heard. Pulmonary/Chest: Effort normal and breath sounds normal. No respiratory distress. She has no wheezes. She has no rales.  Abdominal: Soft. Bowel sounds are normal. She exhibits no distension and no mass. There is no tenderness.  Musculoskeletal: Normal range of motion. She exhibits no edema or tenderness.  Upper / Lower Extremities: - Normal muscle tone, strength bilateral upper extremities 5/5, lower extremities 5/5  Lymphadenopathy:    She has no cervical adenopathy.  Neurological: She is alert and oriented to person, place, and time.  Distal sensation intact to light touch all extremities  Skin: Skin is warm and dry. No rash noted. She is not diaphoretic. No erythema.  Psychiatric: She has a normal mood and affect. Her behavior is normal.  Well groomed, good eye contact, normal speech and thoughts  Nursing note and vitals reviewed.  Recent Labs    12/25/17 1026 04/15/18 1152 07/29/18 0912  HGBA1C 5.9* 5.6 5.8*    Results for orders placed or performed in visit on 07/29/18  T4, free  Result Value Ref Range   Free T4 1.1 0.8 - 1.8 ng/dL  TSH  Result Value Ref Range   TSH 1.37 0.40 - 4.50 mIU/L  Lipid panel  Result Value Ref Range   Cholesterol 200 (H) <200 mg/dL   HDL 42 (L) >16 mg/dL   Triglycerides 109 <604 mg/dL   LDL Cholesterol (Calc) 136 (H) mg/dL (calc)   Total CHOL/HDL Ratio 4.8 <5.0 (calc)   Non-HDL Cholesterol (Calc) 158 (H) <130 mg/dL (calc)  COMPLETE METABOLIC PANEL WITH GFR  Result Value Ref Range   Glucose, Bld 90 65 - 99 mg/dL   BUN 12 7 - 25 mg/dL   Creat 5.40 9.81 - 1.91 mg/dL   GFR, Est Non African American 81 > OR = 60 mL/min/1.28m2   GFR, Est African American 94 > OR = 60  mL/min/1.4m2   BUN/Creatinine Ratio NOT APPLICABLE 6 - 22 (calc)   Sodium 138 135 - 146 mmol/L   Potassium 4.3 3.5 - 5.3 mmol/L   Chloride 104 98 - 110 mmol/L   CO2 27 20 - 32 mmol/L   Calcium  9.3 8.6 - 10.4 mg/dL   Total Protein 7.2 6.1 - 8.1 g/dL   Albumin 4.1 3.6 - 5.1 g/dL   Globulin 3.1 1.9 - 3.7 g/dL (calc)   AG Ratio 1.3 1.0 - 2.5 (calc)   Total Bilirubin 0.4 0.2 - 1.2 mg/dL   Alkaline phosphatase (APISO) 96 33 - 130 U/L   AST 15 10 - 35 U/L   ALT 8 6 - 29 U/L  CBC with Differential/Platelet  Result Value Ref Range   WBC 5.1 3.8 - 10.8 Thousand/uL   RBC 4.38 3.80 - 5.10 Million/uL   Hemoglobin 13.9 11.7 - 15.5 g/dL   HCT 95.6 21.3 - 08.6 %   MCV 93.2 80.0 - 100.0 fL   MCH 31.7 27.0 - 33.0 pg   MCHC 34.1 32.0 - 36.0 g/dL   RDW 57.8 46.9 - 62.9 %   Platelets 192 140 - 400 Thousand/uL   MPV 11.5 7.5 - 12.5 fL   Neutro Abs 2,866 1,500 - 7,800 cells/uL   Lymphs Abs 1,433 850 - 3,900 cells/uL   WBC mixed population 592 200 - 950 cells/uL   Eosinophils Absolute 168 15 - 500 cells/uL   Basophils Absolute 41 0 - 200 cells/uL   Neutrophils Relative % 56.2 %   Total Lymphocyte 28.1 %   Monocytes Relative 11.6 %   Eosinophils Relative 3.3 %   Basophils Relative 0.8 %  Hemoglobin A1c  Result Value Ref Range   Hgb A1c MFr Bld 5.8 (H) <5.7 % of total Hgb   Mean Plasma Glucose 120 (calc)   eAG (mmol/L) 6.6 (calc)      Assessment & Plan:   Problem List Items Addressed This Visit    Degenerative arthritis of knee, bilateral    Stable chronic problem Secondary to mixed OA/DJD and Psoriatic Athritis      Hyperlipidemia    Mild elevated Lipids but similar to previous Last lipid 07/2018 Calculated ASCVD 10 yr risk score >6.5% previously  Plan: Discussed ASCVD risk - not quite at indication for starting Statin therapy - emphasis on lifestyle Continue ASA 81 Encourage improved lifestyle - low carb/cholesterol, reduce portion size, continue improving regular exercise        Morbid obesity with BMI of 40.0-44.9, adult (HCC)    Stable to slightly gained wt Still improving lifestyle Concern with Pre-HTN, likely inc risk progression to HTN. Concern for future worsening OSA as well, however negative screening at last check - Pre-DM, HLD  Plan: Reviewed med options - such as Contrave, GLP1 agents - she will check cost / coverage, notify me if interested.  Emphasized importance of lifestyle changes diet, low sodium, significantly reduce soda intake inc water, smaller portions  Follow-up 6 months- reconsider again Weight Management referral      Pre-diabetes    Mild elevated A1c now up to 5.8, similar range 5.6 to 5.9 Concern with morbid obesity, Pre HTN, HLD  Plan:  1. Not on any therapy currently - remain off - discussed GLP1 agents for wt loss as well 2. Encourage improved lifestyle - low carb, low sugar diet, reduce portion size, continue improving regular exercise 3. Follow-up 6 months PreDM A1c      Pre-hypertension    Improved BP Significant risk factors as previously documented, morbid obesity  Plan Continue monitoring BP May adjust med in future if need already on thiazide low dose      Psoriasis with arthropathy (HCC)    Stable chronic problem, controlled on Taltz therapy from  [facility] has chronic underlying arthritis secondary chronic joint pain - Derm = [facility] Dr Roseanne Reno - Rheum=[facility] Dr Tresa Moore - On Taltz injection >1 yr for immune-modulating therapy for resolving plaque psoriasis skin, limited joint improvement  All lab monitoring results were checked in 12/2017, and faxed to Dermatology       Other Visit Diagnoses    Annual physical exam    -  Primary   Screening for breast cancer       Relevant Orders   MM DIGITAL SCREENING BILATERAL      Updated Health Maintenance information Reviewed recent lab results with patient Encouraged improvement to lifestyle with diet and exercise - Goal of weight  loss'   No orders of the defined types were placed in this encounter.   Follow up plan: Return in about 6 months (around 02/04/2019) for 6 month PreDM A1c, Wt.  Saralyn Pilar, DO [facility] 08/05/2018, 10:30 AM

## 2018-08-05 NOTE — Assessment & Plan Note (Signed)
Stable chronic problem Secondary to mixed OA/DJD and Psoriatic Athritis

## 2018-08-05 NOTE — Assessment & Plan Note (Signed)
Stable chronic problem, controlled on Taltz therapy from [facility] has chronic underlying arthritis secondary chronic joint pain - Derm = [facility] Dr Roseanne RenoStewart - Rheum=[facility] Dr Tresa MooreBehalal-[facility] - On Taltz injection >1 yr for immune-modulating therapy for resolving plaque psoriasis skin, limited joint improvement  All lab monitoring results were checked in 12/2017, and faxed to Dermatology

## 2018-08-05 NOTE — Assessment & Plan Note (Signed)
Mild elevated Lipids but similar to previous Last lipid 07/2018 Calculated ASCVD 10 yr risk score >6.5% previously  Plan: Discussed ASCVD risk - not quite at indication for starting Statin therapy - emphasis on lifestyle Continue ASA 81 Encourage improved lifestyle - low carb/cholesterol, reduce portion size, continue improving regular exercise

## 2018-08-05 NOTE — Assessment & Plan Note (Addendum)
Stable to slightly gained wt Still improving lifestyle Concern with Pre-HTN, likely inc risk progression to HTN. Concern for future worsening OSA as well, however negative screening at last check - Pre-DM, HLD  Plan: Reviewed med options - such as Contrave, GLP1 agents - she will check cost / coverage, notify me if interested.  Emphasized importance of lifestyle changes diet, low sodium, significantly reduce soda intake inc water, smaller portions  Follow-up 6 months- reconsider again Weight Management referral

## 2018-08-05 NOTE — Patient Instructions (Signed)
[name] , Thank you for taking time to come for your Medicare Wellness Visit. I appreciate your ongoing commitment to your health goals. Please review the following plan we discussed and let me know if I can assist you in the future.   Screening recommendations/referrals: Colonoscopy: Up to date, due 02/2025 Mammogram: Up to date, due 08/23/18 Bone Density: Up to date, due 07/2023  Recommended yearly ophthalmology/optometry visit for glaucoma screening and checkup Recommended yearly dental visit for hygiene and checkup  Vaccinations: Influenza vaccine: Up to date Pneumococcal vaccine: Prevnar 13 up to date, pt to check with pharmacy to see if she received the Pneumovax 23. Tdap vaccine: Up to date, due 08/2019  Shingles vaccine: Pt declines today.     Advanced directives: Please bring a copy of your POA (Power of Attorney) and/or Living Will to your next appointment.   Conditions/risks identified: Obesity; Continue trying to increase water intake to 6-8 8 oz glasses daily.   Next appointment: 10:00 AM today with Dr Althea CharonKaramalegos.   Preventive Care 6465 Years and Older, Female Preventive care refers to lifestyle choices and visits with your health care provider that can promote health and wellness. What does preventive care include?  A yearly physical exam. This is also called an annual well check.  Dental exams once or twice a year.  Routine eye exams. Ask your health care provider how often you should have your eyes checked.  Personal lifestyle choices, including:  Daily care of your teeth and gums.  Regular physical activity.  Eating a healthy diet.  Avoiding tobacco and drug use.  Limiting alcohol use.  Practicing safe sex.  Taking low-dose aspirin every day.  Taking vitamin and mineral supplements as recommended by your health care provider. What happens during an annual well check? The services and screenings done by your health care provider during your annual  well check will depend on your age, overall health, lifestyle risk factors, and family history of disease. Counseling  Your health care provider may ask you questions about your:  Alcohol use.  Tobacco use.  Drug use.  Emotional well-being.  Home and relationship well-being.  Sexual activity.  Eating habits.  History of falls.  Memory and ability to understand (cognition).  Work and work Astronomerenvironment.  Reproductive health. Screening  You may have the following tests or measurements:  Height, weight, and BMI.  Blood pressure.  Lipid and cholesterol levels. These may be checked every 5 years, or more frequently if you are over 67 years old.  Skin check.  Lung cancer screening. You may have this screening every year starting at age 67 if you have a 30-pack-year history of smoking and currently smoke or have quit within the past 15 years.  Fecal occult blood test (FOBT) of the stool. You may have this test every year starting at age 67.  Flexible sigmoidoscopy or colonoscopy. You may have a sigmoidoscopy every 5 years or a colonoscopy every 10 years starting at age 67.  Hepatitis C blood test.  Hepatitis B blood test.  Sexually transmitted disease (STD) testing.  Diabetes screening. This is done by checking your blood sugar (glucose) after you have not eaten for a while (fasting). You may have this done every 1-3 years.  Bone density scan. This is done to screen for osteoporosis. You may have this done starting at age 67.  Mammogram. This may be done every 1-2 years. Talk to your health care provider about how often you should have regular mammograms.  Talk with your health care provider about your test results, treatment options, and if necessary, the need for more tests. Vaccines  Your health care provider may recommend certain vaccines, such as:  Influenza vaccine. This is recommended every year.  Tetanus, diphtheria, and acellular pertussis (Tdap, Td) vaccine.  You may need a Td booster every 10 years.  Zoster vaccine. You may need this after age 67.  Pneumococcal 13-valent conjugate (PCV13) vaccine. One dose is recommended after age 67.  Pneumococcal polysaccharide (PPSV23) vaccine. One dose is recommended after age 66. Talk to your health care provider about which screenings and vaccines you need and how often you need them. This information is not intended to replace advice given to you by your health care provider. Make sure you discuss any questions you have with your health care provider. Document Released: 09/09/2015 Document Revised: 05/02/2016 Document Reviewed: 06/14/2015 Elsevier Interactive Patient Education  2017 [place] Prevention in the Home Falls can cause injuries. They can happen to people of all ages. There are many things you can do to make your home safe and to help prevent falls. What can I do on the outside of my home?  Regularly fix the edges of walkways and driveways and fix any cracks.  Remove anything that might make you trip as you walk through a door, such as a raised step or threshold.  Trim any bushes or trees on the path to your home.  Use bright outdoor lighting.  Clear any walking paths of anything that might make someone trip, such as rocks or tools.  Regularly check to see if handrails are loose or broken. Make sure that both sides of any steps have handrails.  Any raised decks and porches should have guardrails on the edges.  Have any leaves, snow, or ice cleared regularly.  Use sand or salt on walking paths during winter.  Clean up any spills in your garage right away. This includes oil or grease spills. What can I do in the bathroom?  Use night lights.  Install grab bars by the toilet and in the tub and shower. Do not use towel bars as grab bars.  Use non-skid mats or decals in the tub or shower.  If you need to sit down in the shower, use a plastic, non-slip stool.  Keep the  floor dry. Clean up any water that spills on the floor as soon as it happens.  Remove soap buildup in the tub or shower regularly.  Attach bath mats securely with double-sided non-slip rug tape.  Do not have throw rugs and other things on the floor that can make you trip. What can I do in the bedroom?  Use night lights.  Make sure that you have a light by your bed that is easy to reach.  Do not use any sheets or blankets that are too big for your bed. They should not hang down onto the floor.  Have a firm chair that has side arms. You can use this for support while you get dressed.  Do not have throw rugs and other things on the floor that can make you trip. What can I do in the kitchen?  Clean up any spills right away.  Avoid walking on wet floors.  Keep items that you use a lot in easy-to-reach places.  If you need to reach something above you, use a strong step stool that has a grab bar.  Keep electrical cords out of the way.  Do not use floor polish or wax that makes floors slippery. If you must use wax, use non-skid floor wax.  Do not have throw rugs and other things on the floor that can make you trip. What can I do with my stairs?  Do not leave any items on the stairs.  Make sure that there are handrails on both sides of the stairs and use them. Fix handrails that are broken or loose. Make sure that handrails are as long as the stairways.  Check any carpeting to make sure that it is firmly attached to the stairs. Fix any carpet that is loose or worn.  Avoid having throw rugs at the top or bottom of the stairs. If you do have throw rugs, attach them to the floor with carpet tape.  Make sure that you have a light switch at the top of the stairs and the bottom of the stairs. If you do not have them, ask someone to add them for you. What else can I do to help prevent falls?  Wear shoes that:  Do not have high heels.  Have rubber bottoms.  Are comfortable and fit  you well.  Are closed at the toe. Do not wear sandals.  If you use a stepladder:  Make sure that it is fully opened. Do not climb a closed stepladder.  Make sure that both sides of the stepladder are locked into place.  Ask someone to hold it for you, if possible.  Clearly mark and make sure that you can see:  Any grab bars or handrails.  First and last steps.  Where the edge of each step is.  Use tools that help you move around (mobility aids) if they are needed. These include:  Canes.  Walkers.  Scooters.  Crutches.  Turn on the lights when you go into a dark area. Replace any light bulbs as soon as they burn out.  Set up your furniture so you have a clear path. Avoid moving your furniture around.  If any of your floors are uneven, fix them.  If there are any pets around you, be aware of where they are.  Review your medicines with your doctor. Some medicines can make you feel dizzy. This can increase your chance of falling. Ask your doctor what other things that you can do to help prevent falls. This information is not intended to replace advice given to you by your health care provider. Make sure you discuss any questions you have with your health care provider. Document Released: 06/09/2009 Document Revised: 01/19/2016 Document Reviewed: 09/17/2014 Elsevier Interactive Patient Education  2017 [employer].

## 2018-08-05 NOTE — Patient Instructions (Addendum)
Thank you for coming to the office today.  Call insurance find cost and coverage of the following:  Contrave - pill - weight loss medication, has anti depressant in it  Ozempic (Semaglutide injection) - start 0.25mg  weekly for 4 weeks then increase to 0.5mg  weekly,   Bydureon BCise (Exenatide ER) - once weekly - easy to use, least side effects - cannot see needle, it is within pen  Trulicity (Dulaglutide) - once weekly - this is very good one, usually one of my top choices as well, two doses, 0.75 (likely we would start) and 1.5 max dose. We can use coupon card here too - cannot see needle, it is within pen  Victoza (Liraglutide) - once DAILY - 3 dose changes 0.6, 1.2 and 1.8, side effects nausea, upset stomach higher on this one but it is still very effective medicine  ------------------------------------------------  For Mammogram screening for breast cancer   Call the Imaging Center below anytime to schedule your own appointment now that order has been placed.  [facility] [address] [place], [place] [address] Phone: [phone]   Please schedule a Follow-up Appointment to: Return in about 6 months (around 02/04/2019) for 6 month PreDM A1c, Wt.  If you have any other questions or concerns, please feel free to call the office or send a message through MyChart. You may also schedule an earlier appointment if necessary.  Additionally, you may be receiving a survey about your experience at our office within a few days to 1 week by e-mail or mail. We value your feedback.  Saralyn PilarAlexander Karamalegos, DO [facility]

## 2018-08-05 NOTE — Assessment & Plan Note (Signed)
Mild elevated A1c now up to 5.8, similar range 5.6 to 5.9 Concern with morbid obesity, Pre HTN, HLD  Plan:  1. Not on any therapy currently - remain off - discussed GLP1 agents for wt loss as well 2. Encourage improved lifestyle - low carb, low sugar diet, reduce portion size, continue improving regular exercise 3. Follow-up 6 months PreDM A1c

## 2019-01-28 IMAGING — DX DG ANKLE COMPLETE 3+V*L*
3 series · 3 of 3 positions shown · non-contrast
Comparison: None.

CLINICAL DATA: Chronic left ankle pain for 2 years with no trauma.

EXAM:
LEFT ANKLE COMPLETE - 3+ VIEW

[ankle ap]
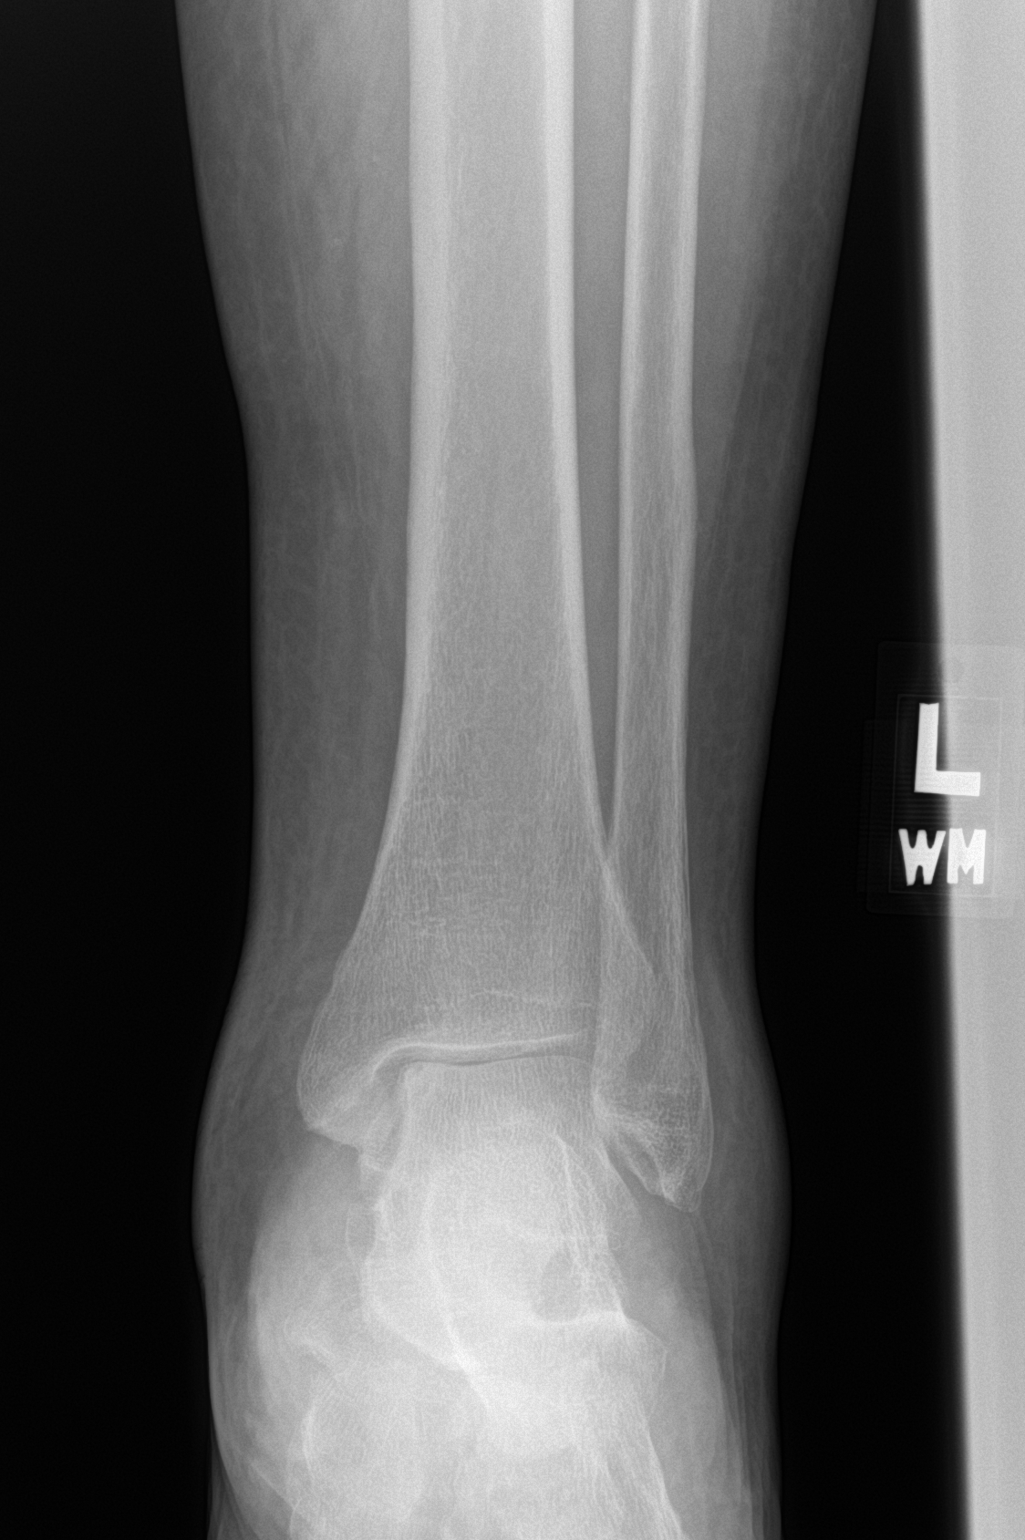

[ankle obl]
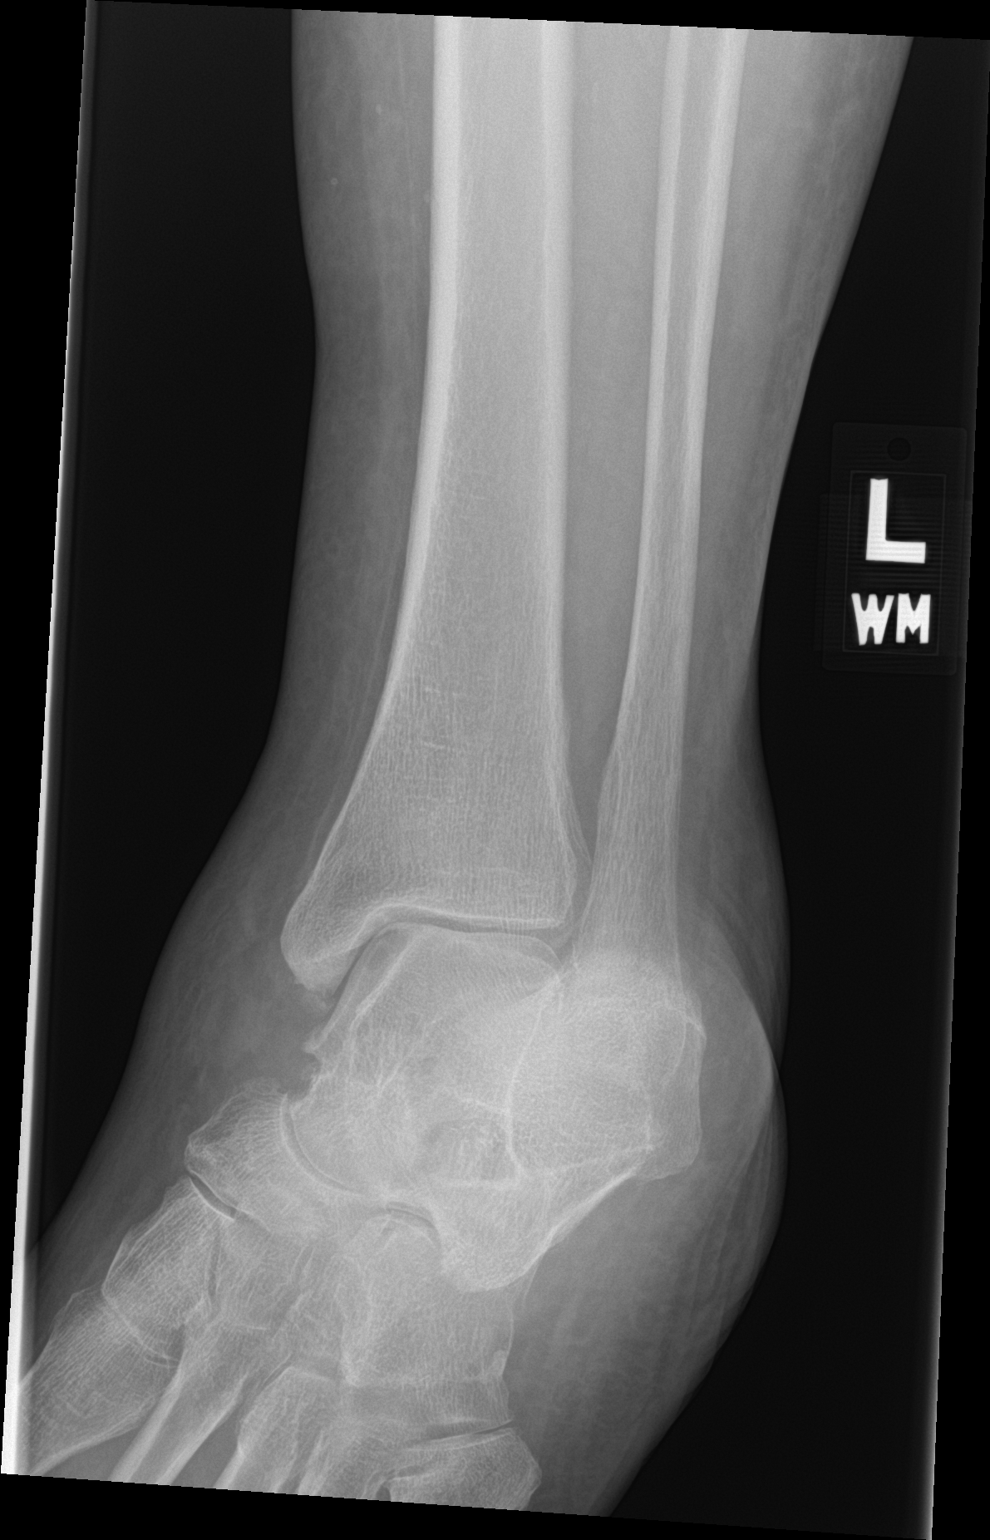

[ankle lat]
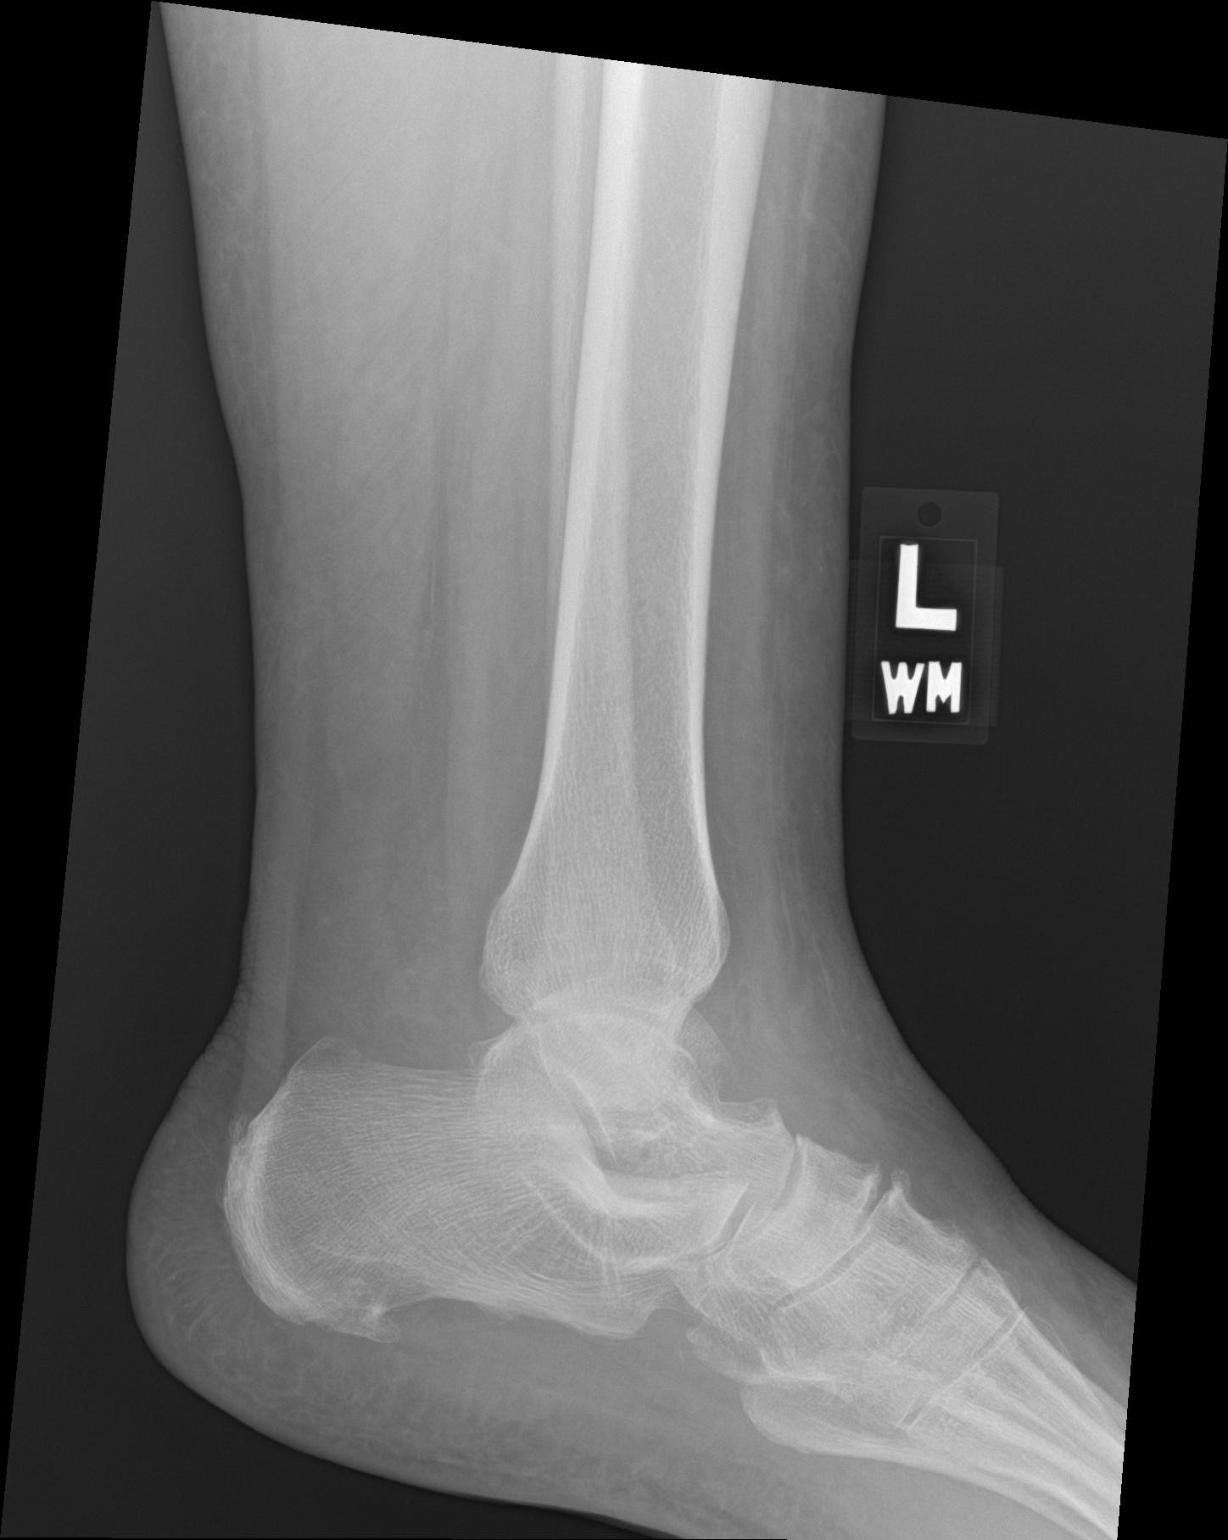

[3 of 3 positions shown; findings below may reference images not displayed]

FINDINGS: Bilateral soft tissue swelling is identified. No fracture or
malalignment. No bony erosions in the ankle. A plantar spur is
identified. No other acute abnormalities. No obvious joint effusion.
IMPRESSION: Bilateral soft tissue swelling. No underlying bony abnormality
identified.

## 2019-01-28 IMAGING — DX DG KNEE COMPLETE 4+V*L*
6 series · 6 of 6 positions shown · non-contrast
Comparison: None.

CLINICAL DATA: Chronic pain for 2 years.

EXAM:
LEFT KNEE - COMPLETE 4+ VIEW

[knee ap]
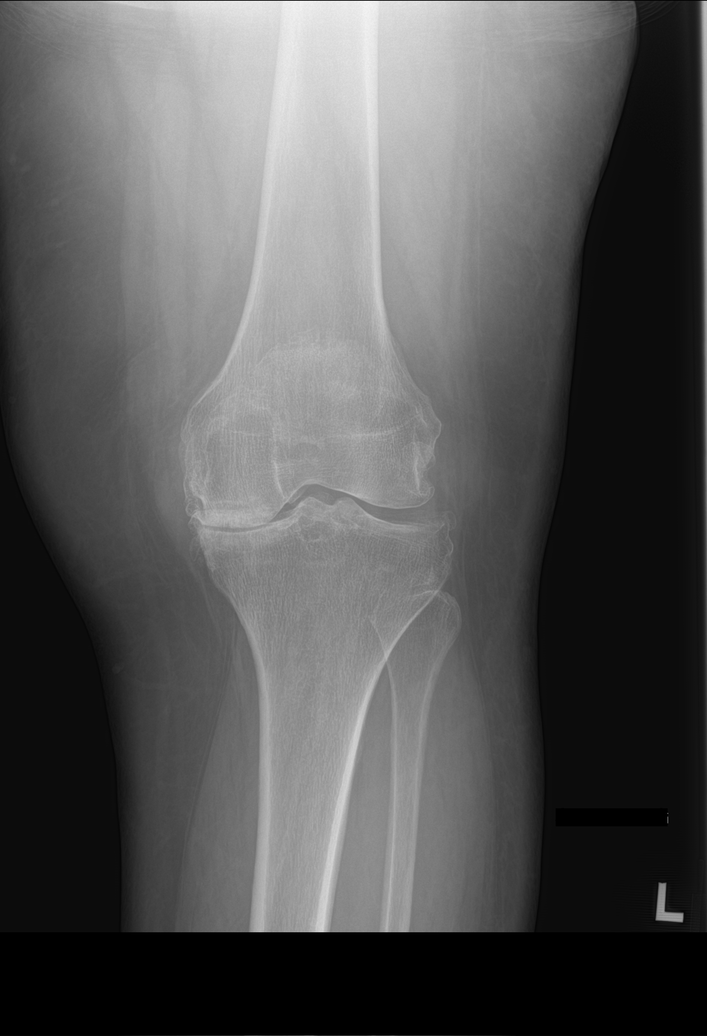

[knee tunnel (1 of 2)]
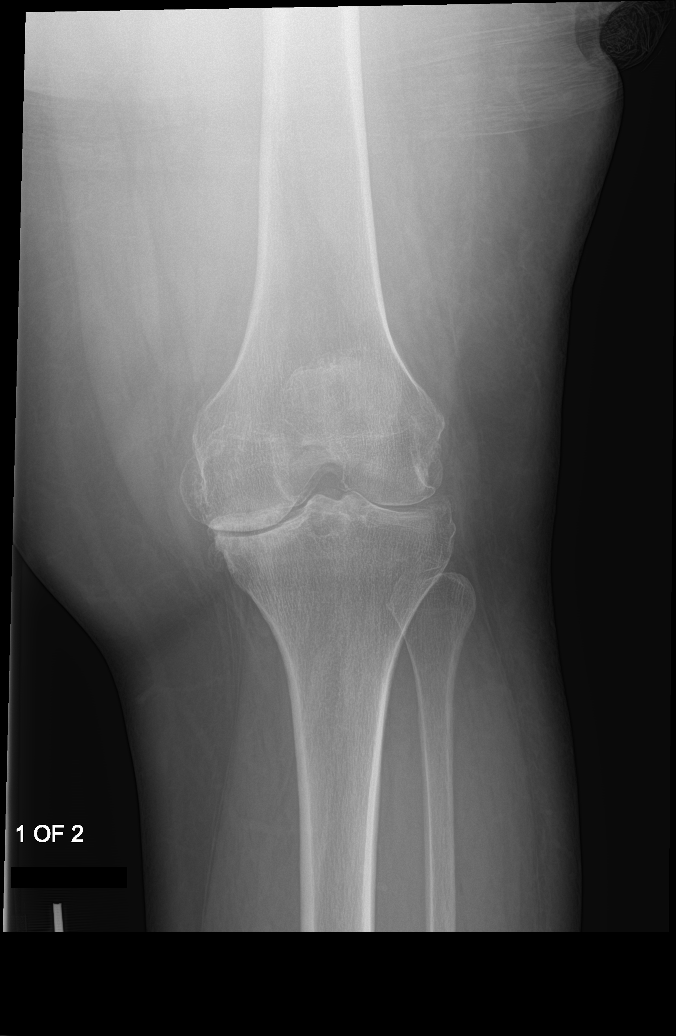

[knee lat (1 of 2)]
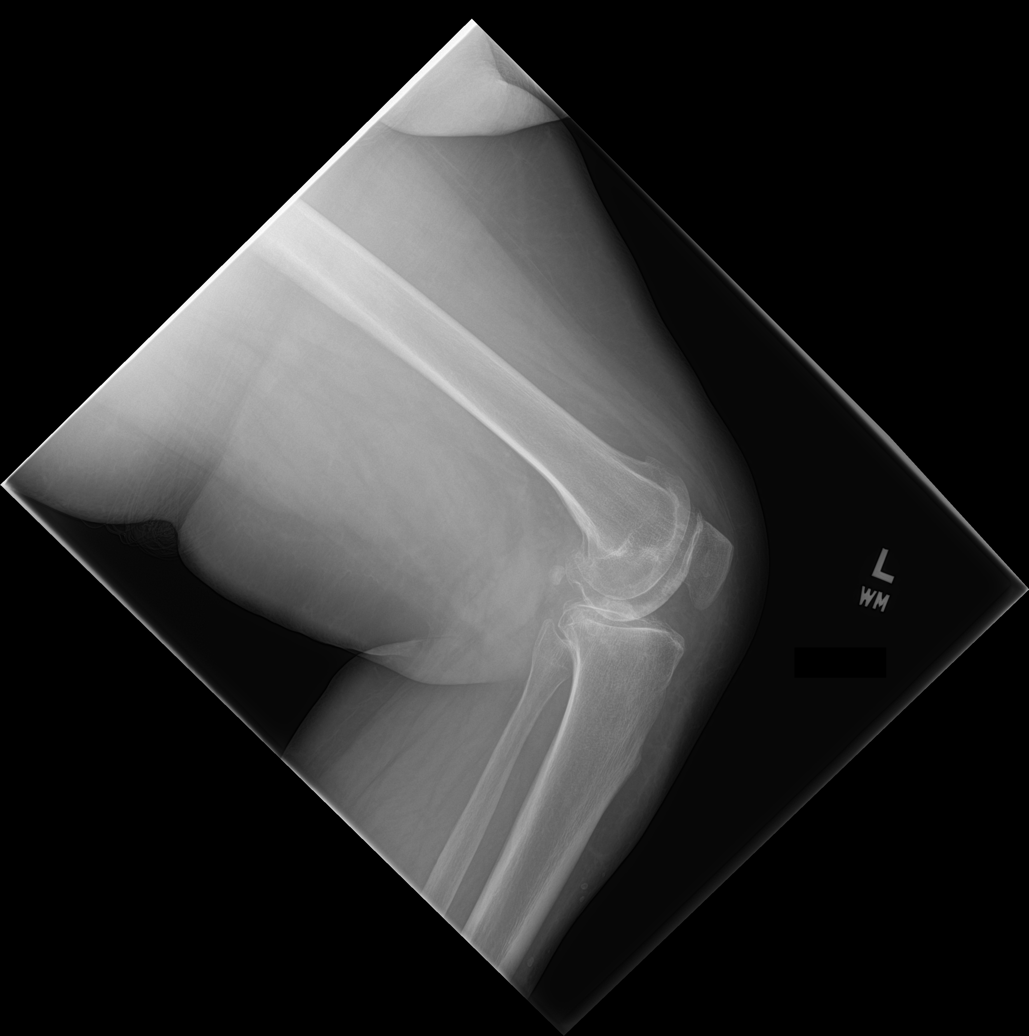

[patella skyline]
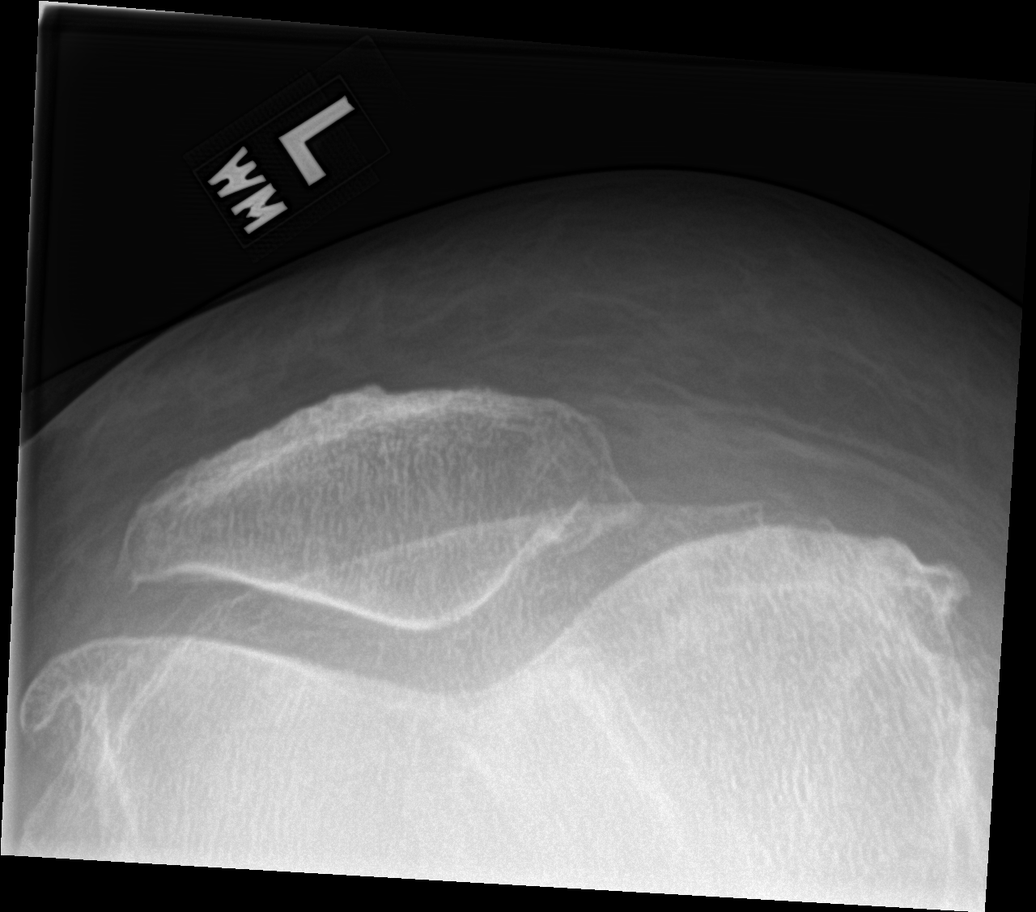

[knee tunnel (2 of 2)]
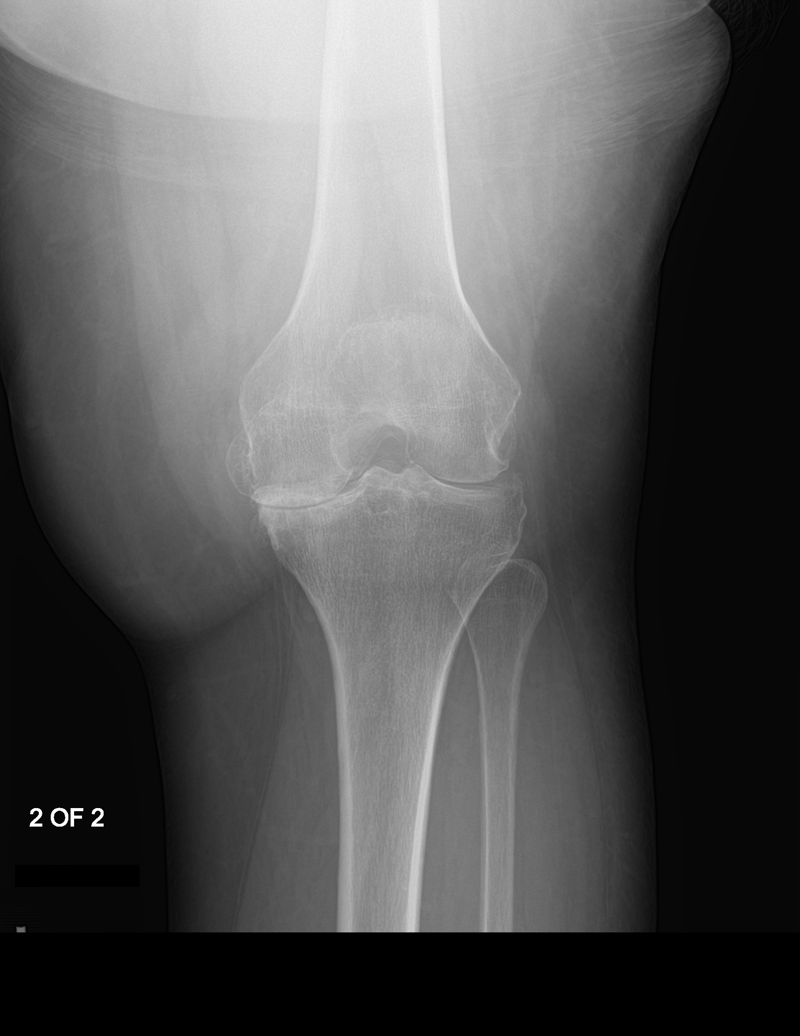

[knee lat (2 of 2)]
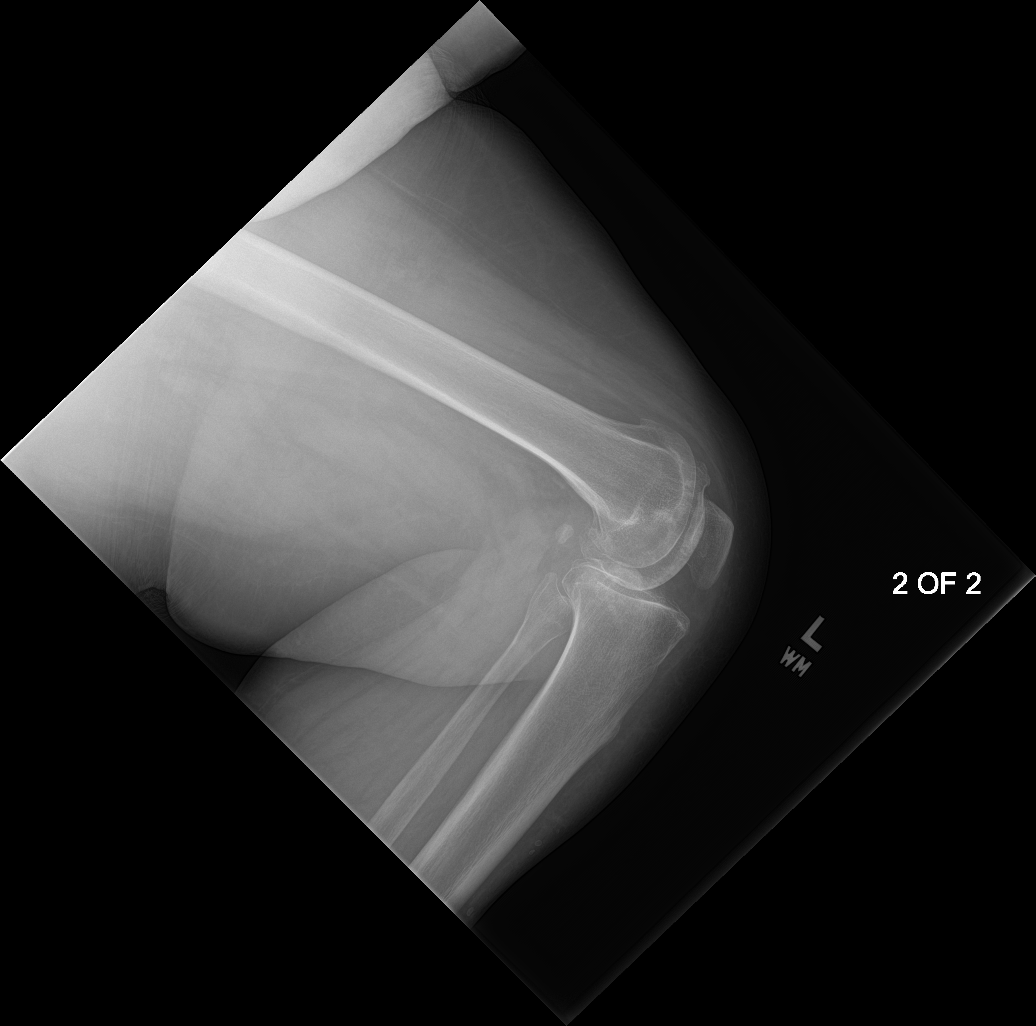

[6 of 6 positions shown; findings below may reference images not displayed]

FINDINGS: Tricompartmental degenerative changes are identified in the left
knee, most prominent medially with near complete loss of joint
space, sclerosis, and osteophytosis. No bony erosions or lesions. No
fractures. No joint effusion.
IMPRESSION: Tricompartmental degenerative changes, most marked and severe in the
medial compartment. No bony erosions.

## 2019-01-28 IMAGING — DX DG KNEE STANDING AP BILAT
1 series · 1 of 1 positions shown · non-contrast
Comparison: None.

CLINICAL DATA: Bilateral pain for 2 years with no trauma.

EXAM:
BILATERAL KNEES STANDING - 1 VIEW

[knee ap]
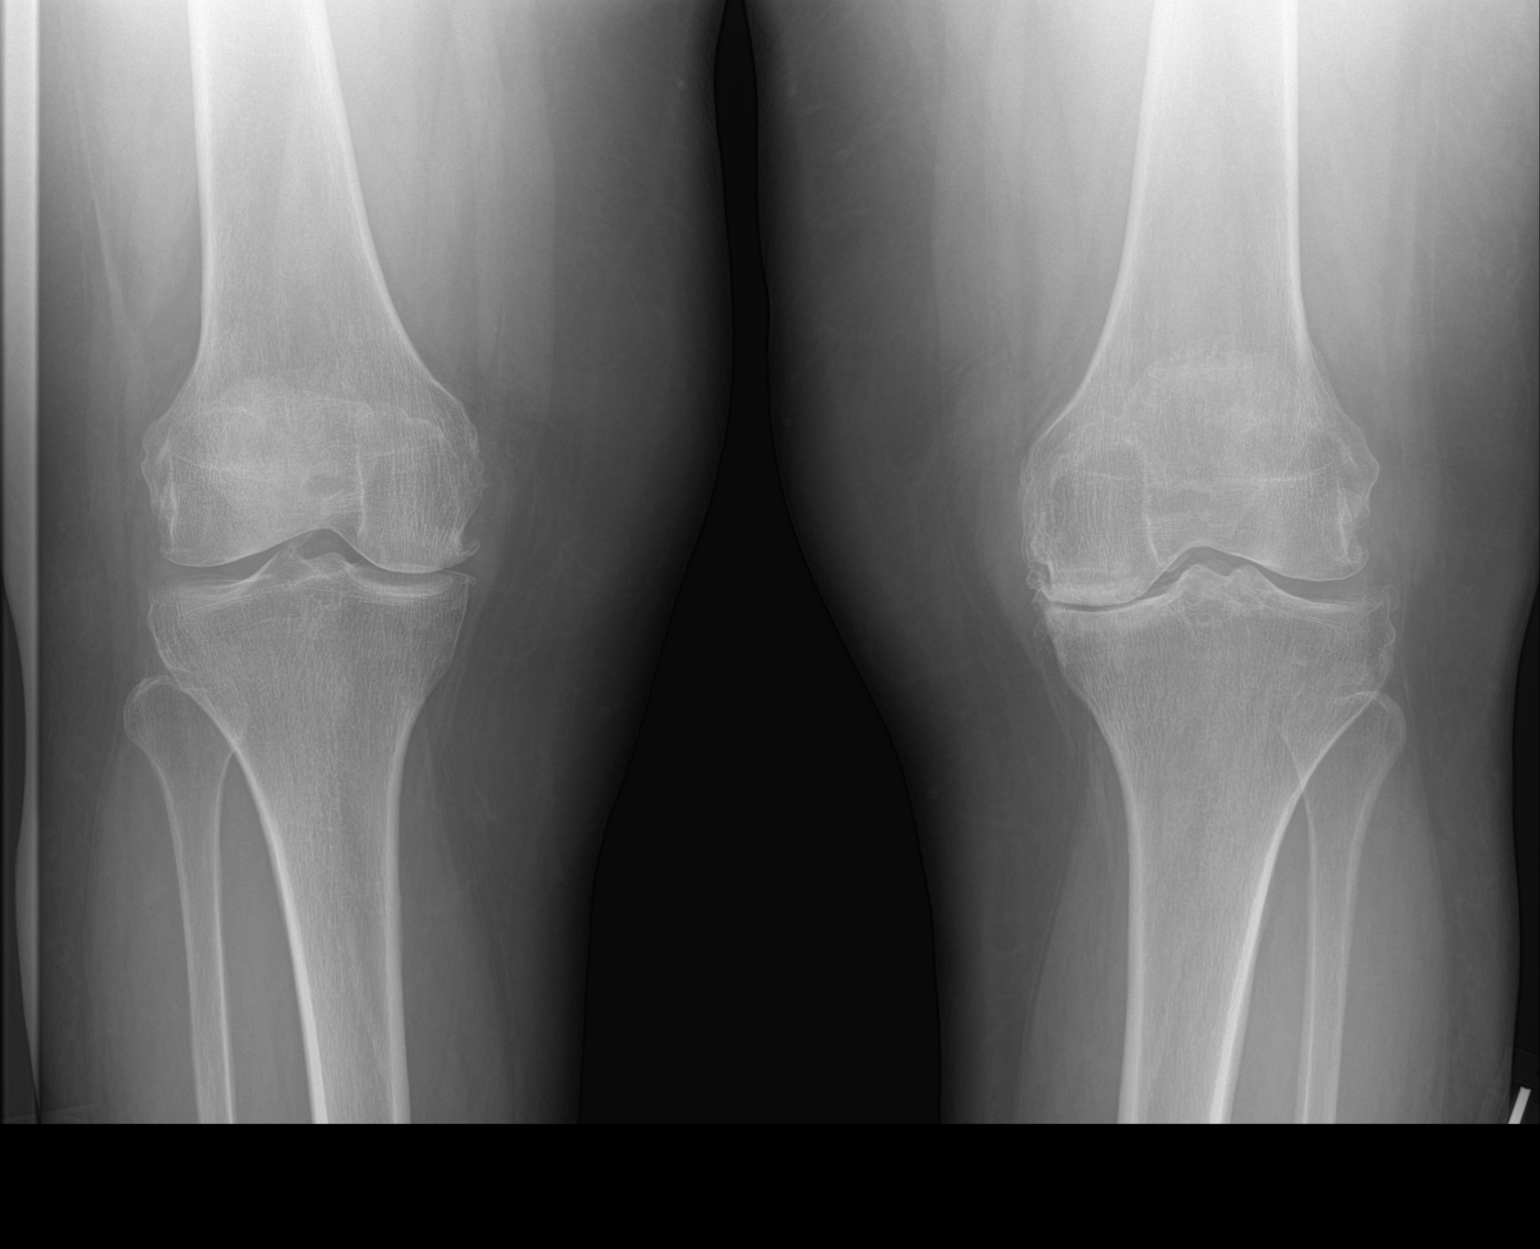

[1 of 1 positions shown; findings below may reference images not displayed]

FINDINGS: Degenerative changes are seen in the knees bilaterally. The
degenerative changes are most marked in the medial compartments with
osteophytes bilaterally and significant joint space narrowing on the
left. Degenerative changes in the lateral compartment are more mild.
No fractures or dislocations. No bony erosions. Lateral views were
not obtained.
IMPRESSION: Bilateral degenerative changes, left greater than right,
particularly in the medial compartments.

## 2019-01-30 ENCOUNTER — Other Ambulatory Visit: Payer: Self-pay | Admitting: Family Medicine

## 2019-01-30 DIAGNOSIS — Z1239 Encounter for other screening for malignant neoplasm of breast: Secondary | ICD-10-CM

## 2019-02-09 ENCOUNTER — Encounter: Payer: Self-pay | Admitting: Family Medicine

## 2019-02-09 ENCOUNTER — Ambulatory Visit (INDEPENDENT_AMBULATORY_CARE_PROVIDER_SITE_OTHER): Payer: Medicare Other | Admitting: Family Medicine

## 2019-02-09 ENCOUNTER — Other Ambulatory Visit: Payer: Self-pay | Admitting: Family Medicine

## 2019-02-09 ENCOUNTER — Other Ambulatory Visit: Payer: Self-pay

## 2019-02-09 VITALS — BP 138/78 | HR 54 | Temp 98.4°F | Resp 16 | Ht 63.0 in | Wt 237.0 lb

## 2019-02-09 DIAGNOSIS — R600 Localized edema: Secondary | ICD-10-CM

## 2019-02-09 DIAGNOSIS — Z6841 Body Mass Index (BMI) 40.0 and over, adult: Secondary | ICD-10-CM

## 2019-02-09 DIAGNOSIS — G8929 Other chronic pain: Secondary | ICD-10-CM

## 2019-02-09 DIAGNOSIS — R7303 Prediabetes: Secondary | ICD-10-CM

## 2019-02-09 DIAGNOSIS — E559 Vitamin D deficiency, unspecified: Secondary | ICD-10-CM

## 2019-02-09 DIAGNOSIS — M25572 Pain in left ankle and joints of left foot: Secondary | ICD-10-CM

## 2019-02-09 DIAGNOSIS — R030 Elevated blood-pressure reading, without diagnosis of hypertension: Secondary | ICD-10-CM

## 2019-02-09 DIAGNOSIS — L4050 Arthropathic psoriasis, unspecified: Secondary | ICD-10-CM

## 2019-02-09 DIAGNOSIS — Z0000 Encounter for general adult medical examination without abnormal findings: Secondary | ICD-10-CM

## 2019-02-09 DIAGNOSIS — E538 Deficiency of other specified B group vitamins: Secondary | ICD-10-CM

## 2019-02-09 DIAGNOSIS — M170 Bilateral primary osteoarthritis of knee: Secondary | ICD-10-CM

## 2019-02-09 DIAGNOSIS — M25562 Pain in left knee: Secondary | ICD-10-CM | POA: Diagnosis not present

## 2019-02-09 DIAGNOSIS — E782 Mixed hyperlipidemia: Secondary | ICD-10-CM

## 2019-02-09 LAB — POCT GLYCOSYLATED HEMOGLOBIN (HGB A1C): Hemoglobin A1C: 5.1 % (ref 4.0–5.6)

## 2019-02-09 MED ORDER — MELOXICAM 15 MG PO TABS: 15.0000 mg | ORAL_TABLET | Freq: Every day | ORAL | 2 refills | Status: DC

## 2019-02-09 MED ORDER — HYDROCHLOROTHIAZIDE 12.5 MG PO TABS: 12.5000 mg | ORAL_TABLET | Freq: Every day | ORAL | 1 refills | Status: DC | PRN

## 2019-02-09 MED ORDER — BACLOFEN 10 MG PO TABS: 5.0000 mg | ORAL_TABLET | Freq: Three times a day (TID) | ORAL | 2 refills | Status: DC | PRN

## 2019-02-09 NOTE — Assessment & Plan Note (Signed)
Well-controlled Pre-DM with A1c down to 5.1 Concern with obesity, HTN, HLD  Plan:  1. Not on any therapy currently  2. Encourage improved lifestyle - low carb, low sugar diet, reduce portion size, continue improving regular exercise 3. Follow-up q 6 mo

## 2019-02-09 NOTE — Assessment & Plan Note (Signed)
Improving weight loss Lifestyle improved Down 13 lbs +

## 2019-02-09 NOTE — Progress Notes (Signed)
Subjective:    Patient ID: Morgan Mcclain, female    DOB: 08-06-1951, 68 y.o.   MRN: [id number]  Morgan Mcclain is a 68 y.o. female presenting on 02/09/2019 for Pre-Diabetes and Obesity   HPI   Psoriasis, Chronic / History of Psoriatic Arthritis and OA/DJD - Followed by [place] Dr Annalee Genta / and Dermatology, Dr[facility]araStewart at [facility] previously was on trial of meloxicam and baclofen within past 2 years, some benefit at that time, now asking to re order these medications.  Edema, controlled - with compression stockings  Pre-HTN Home BP readings have been normal. Not taking HCTZ regularly. She takes HCTZ 12.5mg  PRN, needs refill today. Reduced edema. Denies CP, dyspnea, HA, dizziness / lightheadedness  Pre-Diabetes /HYPERLIPIDEMIA/ MORBID OBESITY BMI >41 Today doing well her A1c has improved to 5.1 on last lab. She has lost weight and improved lifestyle. Lifestyle - Weight down 13 lbs approx - Diet:Still improving diet, reduced portion size - Exercise:goal to start moreregular walking and exercisestill Denies hypoglycemia     Depression screen [facility] 2/9 02/09/2019 08/05/2018 04/15/2018  Decreased Interest 0 0 0  Down, Depressed, Hopeless 0 0 0  PHQ - 2 Score 0 0 0  Altered sleeping - - -  Tired, decreased energy - - -  Change in appetite - - -  Feeling bad or failure about yourself  - - -  Trouble concentrating - - -  Moving slowly or fidgety/restless - - -  Suicidal thoughts - - -  PHQ-9 Score - - -  Difficult doing work/chores - - -    Social History   Tobacco Use  . Smoking status: Never Smoker  . Smokeless tobacco: Never Used  Substance Use Topics  . Alcohol use: No  . Drug use: No    Review of Systems Per HPI unless specifically indicated above     Objective:    BP 138/78 (BP Location: Left Arm, Cuff Size: Large)   Pulse (!) 54   Temp 98.4 F (36.9 C) (Oral)   Resp 16   Ht 5\' 3"  (1.6 m)   Wt 237 lb (107.5  kg)   BMI 41.98 kg/m   Wt Readings from Last 3 Encounters:  02/09/19 237 lb (107.5 kg)  08/05/18 250 lb 3.2 oz (113.5 kg)  08/05/18 250 lb 3.2 oz (113.5 kg)    Physical Exam Vitals signs and nursing note reviewed.  Constitutional:      General: She is not in acute distress.    Appearance: She is well-developed. She is not diaphoretic.     Comments: Well-appearing, comfortable, cooperative, weight loss with obesity currently  HENT:     Head: Normocephalic and atraumatic.  Eyes:     General:        Right eye: No discharge.        Left eye: No discharge.     Conjunctiva/sclera: Conjunctivae normal.  Cardiovascular:     Rate and Rhythm: Normal rate.  Pulmonary:     Effort: Pulmonary effort is normal.  Skin:    General: Skin is warm and dry.     Findings: No erythema or rash.  Neurological:     Mental Status: She is alert and oriented to person, place, and time.  Psychiatric:        Behavior: Behavior normal.     Comments: Well groomed, good eye contact, normal speech and thoughts      Recent Labs    04/15/18 1152 07/29/18 0912  02/09/19 0930  HGBA1C 5.6 5.8* 5.1    Results for orders placed or performed in visit on 02/09/19  POCT HgB A1C  Result Value Ref Range   Hemoglobin A1C 5.1 4.0 - 5.6 %      Assessment & Plan:   Problem List Items Addressed This Visit    Bilateral lower extremity edema   Relevant Medications   hydrochlorothiazide (HYDRODIURIL) 12.5 MG tablet   Chronic pain of left ankle   Relevant Medications   meloxicam (MOBIC) 15 MG tablet   baclofen (LIORESAL) 10 MG tablet   Degenerative arthritis of knee, bilateral    Stable chronic problem Secondary to mixed OA/DJD and Psoriatic Athritis  Re order baclofen and meloxicam, PRN use      Relevant Medications   meloxicam (MOBIC) 15 MG tablet   baclofen (LIORESAL) 10 MG tablet   Morbid obesity with BMI of 40.0-44.9, adult (HCC)    Improving weight loss Lifestyle improved Down 13 lbs +       Pre-diabetes - Primary    Well-controlled Pre-DM with A1c down to 5.1 Concern with obesity, HTN, HLD  Plan:  1. Not on any therapy currently  2. Encourage improved lifestyle - low carb, low sugar diet, reduce portion size, continue improving regular exercise 3. Follow-up q 6 mo       Relevant Orders   POCT HgB A1C (Completed)   Pre-hypertension    Mild elevated, somewhat improved Significant risk factors as previously documented, morbid obesity  Plan Continue monitoring BP May adjust med in future if need already on thiazide low dose - can increase to regular dosing vs other med      Relevant Medications   hydrochlorothiazide (HYDRODIURIL) 12.5 MG tablet   Psoriasis with arthropathy (HCC)    Stable chronic problem, controlled on Taltz therapy from [place] [place] has chronic underlying arthritis secondary chronic joint pain - Derm = [facility] Dr Roseanne RenoStewart - Rheum=[facility]resa [place] - On Taltz injection >1 yr for immune-modulating therapy for resolving plaque psoriasis skin, limited joint improvement  Lab monitoring done routinely  Re order baclofen PRN and Meloxicam PRN       Other Visit Diagnoses    Chronic pain of left knee       Relevant Medications   meloxicam (MOBIC) 15 MG tablet   baclofen (LIORESAL) 10 MG tablet      Meds ordered this encounter  Medications  . hydrochlorothiazide (HYDRODIURIL) 12.5 MG tablet    Sig: Take 1 tablet (12.5 mg total) by mouth daily as needed.    Dispense:  90 tablet    Refill:  1  . meloxicam (MOBIC) 15 MG tablet    Sig: Take 1 tablet (15 mg total) by mouth daily. With food. Do not take Ibuprofen, Advil, Aleve on this med.    Dispense:  30 tablet    Refill:  2  . baclofen (LIORESAL) 10 MG tablet    Sig: Take 0.5-1 tablets (5-10 mg total) by mouth 3 (three) times daily as needed for muscle spasms.    Dispense:  30 each    Refill:  2     Follow up plan: Return in about 6 months (around 08/11/2019) for Annual  Physical.  Future labs ordered for 08/04/19  Saralyn PilarAlexander Lyrick Worland, DO [facility] 02/09/2019, 9:28 AM

## 2019-02-09 NOTE — Patient Instructions (Addendum)
Thank you for coming to the office today.  RESTART taking Baclofen (Lioresal) 10mg  (muscle relaxant) - start with half (cut) to one whole pill at night as needed for next 1-3 nights (may make you drowsy, caution with driving) see how it affects you, then if tolerated increase to one pill 2 to 3 times a day or (every 8 hours as needed)  RESTART Meloxicam 15mg  once daily with food - for anti inflammatory for pain relief - prefer to take ONLY AS NEEDED, if need it can take up to 1-2 weeks as needed then stop.  Re ordered fluid pill can take daily as needed.  Recent Labs    04/15/18 1152 07/29/18 0912 02/09/19 0930  HGBA1C 5.6 5.8* 5.1    Congratulations on sugar improvement and weight loss  Keep up the good work.  DUE for FASTING BLOOD WORK (no food or drink after midnight before the lab appointment, only water or coffee without cream/sugar on the morning of)  SCHEDULE "Lab Only" visit in the morning at the clinic for lab draw in 6 MONTHS   - Make sure Lab Only appointment is at about 1 week before your next appointment, so that results will be available  For Lab Results, once available within 2-3 days of blood draw, you can can log in to MyChart online to view your results and a brief explanation. Also, we can discuss results at next follow-up visit.   Please schedule a Follow-up Appointment to: Return in about 6 months (around 08/11/2019) for Annual Physical.  If you have any other questions or concerns, please feel free to call the office or send a message through [place]. You may also schedule an earlier appointment if necessary.  Additionally, you may be receiving a survey about your experience at our office within a few days to 1 week by e-mail or mail. We value your feedback.  Nobie Putnam, DO [place]

## 2019-02-09 NOTE — Assessment & Plan Note (Signed)
Mild elevated, somewhat improved Significant risk factors as previously documented, morbid obesity  Plan Continue monitoring BP May adjust med in future if need already on thiazide low dose - can increase to regular dosing vs other med

## 2019-02-09 NOTE — Assessment & Plan Note (Signed)
Stable chronic problem Secondary to mixed OA/DJD and Psoriatic Athritis  Re order baclofen and meloxicam, PRN use

## 2019-02-09 NOTE — Assessment & Plan Note (Addendum)
Stable chronic problem, controlled on Taltz therapy from [place] has chronic underlying arthritis secondary chronic joint pain - Derm = [facility] Dr Nicole Kindred - Rheum=[facility] Dr Annalee Genta - On Taltz injection >1 yr for immune-modulating therapy for resolving plaque psoriasis skin, limited joint improvement  Lab monitoring done routinely  Re order baclofen PRN and Meloxicam PRN

## 2019-06-29 ENCOUNTER — Ambulatory Visit
Admission: RE | Admit: 2019-06-29 | Discharge: 2019-06-29 | Disposition: A | Discharge status: Home / Self Care | Payer: Medicare Other | Source: Ambulatory Visit | Attending: Family Medicine | Admitting: Family Medicine

## 2019-06-29 ENCOUNTER — Other Ambulatory Visit: Payer: Self-pay | Admitting: Family Medicine

## 2019-06-29 DIAGNOSIS — R928 Other abnormal and inconclusive findings on diagnostic imaging of breast: Secondary | ICD-10-CM

## 2019-06-29 DIAGNOSIS — Z1231 Encounter for screening mammogram for malignant neoplasm of breast: Secondary | ICD-10-CM | POA: Insufficient documentation

## 2019-06-29 DIAGNOSIS — N6310 Unspecified lump in the right breast, unspecified quadrant: Secondary | ICD-10-CM

## 2019-06-29 DIAGNOSIS — R921 Mammographic calcification found on diagnostic imaging of breast: Secondary | ICD-10-CM | POA: Insufficient documentation

## 2019-06-29 DIAGNOSIS — Z1239 Encounter for other screening for malignant neoplasm of breast: Secondary | ICD-10-CM

## 2019-06-29 DIAGNOSIS — N6320 Unspecified lump in the left breast, unspecified quadrant: Secondary | ICD-10-CM

## 2019-07-08 ENCOUNTER — Ambulatory Visit
Admission: RE | Admit: 2019-07-08 | Discharge: 2019-07-08 | Disposition: A | Discharge status: Home / Self Care | Payer: Medicare Other | Source: Ambulatory Visit | Attending: Family Medicine | Admitting: Family Medicine

## 2019-07-08 ENCOUNTER — Other Ambulatory Visit: Payer: Self-pay | Admitting: Family Medicine

## 2019-07-08 DIAGNOSIS — N6320 Unspecified lump in the left breast, unspecified quadrant: Secondary | ICD-10-CM | POA: Diagnosis present

## 2019-07-08 DIAGNOSIS — R921 Mammographic calcification found on diagnostic imaging of breast: Secondary | ICD-10-CM

## 2019-07-08 DIAGNOSIS — N6310 Unspecified lump in the right breast, unspecified quadrant: Secondary | ICD-10-CM

## 2019-07-08 DIAGNOSIS — R928 Other abnormal and inconclusive findings on diagnostic imaging of breast: Secondary | ICD-10-CM

## 2019-07-14 ENCOUNTER — Ambulatory Visit
Admission: RE | Admit: 2019-07-14 | Discharge: 2019-07-14 | Disposition: A | Discharge status: Home / Self Care | Payer: Medicare Other | Source: Ambulatory Visit | Attending: Family Medicine | Admitting: Family Medicine

## 2019-07-14 DIAGNOSIS — R921 Mammographic calcification found on diagnostic imaging of breast: Secondary | ICD-10-CM | POA: Insufficient documentation

## 2019-07-14 DIAGNOSIS — R928 Other abnormal and inconclusive findings on diagnostic imaging of breast: Secondary | ICD-10-CM

## 2019-07-14 HISTORY — PX: BREAST BIOPSY: SHX20

## 2019-07-15 LAB — SURGICAL PATHOLOGY

## 2019-08-04 ENCOUNTER — Other Ambulatory Visit: Payer: Self-pay

## 2019-08-04 ENCOUNTER — Other Ambulatory Visit: Payer: Medicare Other

## 2019-08-04 DIAGNOSIS — E559 Vitamin D deficiency, unspecified: Secondary | ICD-10-CM

## 2019-08-04 DIAGNOSIS — E538 Deficiency of other specified B group vitamins: Secondary | ICD-10-CM

## 2019-08-04 DIAGNOSIS — Z6841 Body Mass Index (BMI) 40.0 and over, adult: Secondary | ICD-10-CM

## 2019-08-04 DIAGNOSIS — R7303 Prediabetes: Secondary | ICD-10-CM

## 2019-08-04 DIAGNOSIS — E782 Mixed hyperlipidemia: Secondary | ICD-10-CM

## 2019-08-04 DIAGNOSIS — R030 Elevated blood-pressure reading, without diagnosis of hypertension: Secondary | ICD-10-CM

## 2019-08-04 DIAGNOSIS — Z0000 Encounter for general adult medical examination without abnormal findings: Secondary | ICD-10-CM

## 2019-08-05 LAB — HEMOGLOBIN A1C
Hgb A1c MFr Bld: 5.7 % of total Hgb — ABNORMAL HIGH (ref <5.7)
Mean Plasma Glucose: 117 (calc)
eAG (mmol/L): 6.5 (calc)

## 2019-08-05 LAB — CBC WITH DIFFERENTIAL/PLATELET
Absolute Monocytes: 576 cells/uL (ref 200–950)
Basophils Absolute: 29 cells/uL (ref 0–200)
Basophils Relative: 0.9 %
Eosinophils Absolute: 38 cells/uL (ref 15–500)
Eosinophils Relative: 1.2 %
HCT: 43.5 % (ref 35.0–45.0)
Hemoglobin: 14.4 g/dL (ref 11.7–15.5)
Lymphs Abs: 704 cells/uL — ABNORMAL LOW (ref 850–3900)
MCH: 31.2 pg (ref 27.0–33.0)
MCHC: 33.1 g/dL (ref 32.0–36.0)
MCV: 94.2 fL (ref 80.0–100.0)
MPV: 11.2 fL (ref 7.5–12.5)
Monocytes Relative: 18 %
Neutro Abs: 1853 cells/uL (ref 1500–7800)
Neutrophils Relative %: 57.9 %
Platelets: 132 10*3/uL — ABNORMAL LOW (ref 140–400)
RBC: 4.62 10*6/uL (ref 3.80–5.10)
RDW: 12.3 % (ref 11.0–15.0)
Total Lymphocyte: 22 %
WBC: 3.2 10*3/uL — ABNORMAL LOW (ref 3.8–10.8)

## 2019-08-05 LAB — COMPLETE METABOLIC PANEL WITH GFR
AG Ratio: 1.2 (calc) (ref 1.0–2.5)
ALT: 6 U/L (ref 6–29)
AST: 15 U/L (ref 10–35)
Albumin: 4 g/dL (ref 3.6–5.1)
Alkaline phosphatase (APISO): 92 U/L (ref 37–153)
BUN: 8 mg/dL (ref 7–25)
CO2: 26 mmol/L (ref 20–32)
Calcium: 9 mg/dL (ref 8.6–10.4)
Chloride: 104 mmol/L (ref 98–110)
Creat: 0.74 mg/dL (ref 0.50–0.99)
GFR, Est African American: 96 mL/min/{1.73_m2} (ref >=60)
GFR, Est Non African American: 83 mL/min/{1.73_m2} (ref >=60)
Globulin: 3.3 g/dL (calc) (ref 1.9–3.7)
Glucose, Bld: 96 mg/dL (ref 65–99)
Potassium: 4.1 mmol/L (ref 3.5–5.3)
Sodium: 139 mmol/L (ref 135–146)
Total Bilirubin: 0.3 mg/dL (ref 0.2–1.2)
Total Protein: 7.3 g/dL (ref 6.1–8.1)

## 2019-08-05 LAB — LIPID PANEL
Cholesterol: 203 mg/dL — ABNORMAL HIGH (ref <200)
HDL: 41 mg/dL — ABNORMAL LOW (ref >=50)
LDL Cholesterol (Calc): 140 mg/dL (calc) — ABNORMAL HIGH
Non-HDL Cholesterol (Calc): 162 mg/dL (calc) — ABNORMAL HIGH (ref <130)
Total CHOL/HDL Ratio: 5 (calc) — ABNORMAL HIGH (ref <5.0)
Triglycerides: 105 mg/dL (ref <150)

## 2019-08-05 LAB — VITAMIN D 25 HYDROXY (VIT D DEFICIENCY, FRACTURES): Vit D, 25-Hydroxy: 22 ng/mL — ABNORMAL LOW (ref 30–100)

## 2019-08-05 LAB — VITAMIN B12: Vitamin B-12: 566 pg/mL (ref 200–1100)

## 2019-08-05 LAB — TSH: TSH: 1.06 mIU/L (ref 0.40–4.50)

## 2019-08-11 ENCOUNTER — Encounter: Payer: Self-pay | Admitting: Family Medicine

## 2019-08-11 ENCOUNTER — Other Ambulatory Visit: Payer: Self-pay | Admitting: Family Medicine

## 2019-08-11 ENCOUNTER — Other Ambulatory Visit: Payer: Self-pay

## 2019-08-11 ENCOUNTER — Ambulatory Visit (INDEPENDENT_AMBULATORY_CARE_PROVIDER_SITE_OTHER): Payer: Medicare Other | Admitting: Family Medicine

## 2019-08-11 VITALS — BP 138/70 | HR 67 | Temp 97.8°F | Resp 16 | Ht 63.0 in | Wt 235.6 lb

## 2019-08-11 DIAGNOSIS — E782 Mixed hyperlipidemia: Secondary | ICD-10-CM

## 2019-08-11 DIAGNOSIS — R7303 Prediabetes: Secondary | ICD-10-CM

## 2019-08-11 DIAGNOSIS — E538 Deficiency of other specified B group vitamins: Secondary | ICD-10-CM

## 2019-08-11 DIAGNOSIS — Z0000 Encounter for general adult medical examination without abnormal findings: Secondary | ICD-10-CM | POA: Diagnosis not present

## 2019-08-11 DIAGNOSIS — E559 Vitamin D deficiency, unspecified: Secondary | ICD-10-CM

## 2019-08-11 DIAGNOSIS — Z6841 Body Mass Index (BMI) 40.0 and over, adult: Secondary | ICD-10-CM

## 2019-08-11 DIAGNOSIS — R030 Elevated blood-pressure reading, without diagnosis of hypertension: Secondary | ICD-10-CM

## 2019-08-11 MED ORDER — ROSUVASTATIN CALCIUM 10 MG PO TABS: 10.0000 mg | ORAL_TABLET | Freq: Every day | ORAL | 3 refills | Status: DC

## 2019-08-11 NOTE — Assessment & Plan Note (Signed)
Still mild to moderate elevated Lipids Last lipid 07/2019 Calculated ASCVD 10 yr risk score 10%  Plan: Discussed ASCVD risk - START on initial therapy statin - Rosuvastatin 10mg  nightly - counseling on benefit risk, potential side effect, dosing strategy May STOP ASA 81 as long as taking Statin Encourage improved lifestyle - low carb/cholesterol, reduce portion size, continue improving regular exercise  Check labs CMET Lipid in 3 months

## 2019-08-11 NOTE — Assessment & Plan Note (Signed)
Start OTC Vitamin D3 5,000 iu daily for 12 weeks then reduce to OTC Vitamin D3 2,000 iu daily for maintenance

## 2019-08-11 NOTE — Assessment & Plan Note (Signed)
Improving weight loss Lifestyle improved Down 15 lbs in 1 year.

## 2019-08-11 NOTE — Assessment & Plan Note (Signed)
Elevated A1c to 5.7 with Pre-DM Concern with obesity, HTN, HLD  Plan:  1. Not on any therapy currently  2. Encourage improved lifestyle - low carb, low sugar diet, reduce portion size, continue improving regular exercise 3. Follow-up q 6 mo lab

## 2019-08-11 NOTE — Progress Notes (Signed)
Subjective:    Patient ID: Morgan Mcclain, female    DOB: 1951/04/26, 68 y.o.   MRN: [id number]  Morgan Mcclain is a 68 y.o. female presenting on 08/11/2019 for Annual Exam   HPI   Here for Annual Physical and Lab Reivew.  Psoriasis, Chronic / History of Psoriatic Arthritis and OA/DJD - [facility] Dr Tresa MooreBehalal-Bock / andDermatology, DrTaraStewart at [facility] No new complaints or problems.  Pre-HTN Home BP readings have been normal. Not taking HCTZ regularly. She takes HCTZ 12.5mg  PRN 3-4 x week. Controlled edema LE on compression and diuretic PRN Denies CP, dyspnea, HA, dizziness / lightheadedness  Pre-Diabetes /HYPERLIPIDEMIA/ MORBID OBESITY BMI >41 Elevated A1c up to 5.7, similar to prior results in mild Pre-DM She has lost weight and improved lifestyle. Lifestyle - Weight down 15  lbs approx 1 year - Diet:Still improving diet, reduced portion size - Exercise:goal to start moreregular walking and exercisestill Denies hypoglycemia  Vitamin D Deficiency Last lab result 22, had been high as 27, previous lower. Unsure last dose of supplement.  Health Maintenance: UTD Flu vaccine.  UTD Colonoscopy 2016  UTD Mammogram screening, abnormal, ultimately had bilateral breast biopsy, negative in 2020.  [facility] 08/11/2019 02/09/2019 08/05/2018  Decreased Interest 0 0 0  Down, Depressed, Hopeless 0 0 0  PHQ - 2 Score 0 0 0  Altered sleeping - - -  Tired, decreased energy - - -  Change in appetite - - -  Feeling bad or failure about yourself  - - -  Trouble concentrating - - -  Moving slowly or fidgety/restless - - -  Suicidal thoughts - - -  PHQ-9 Score - - -  Difficult doing work/chores - - -    Past Medical History:  Diagnosis Date  . Arthritis   . Left ureteral calculus   . Psoriasis SEVERE--  RECEIVES LASE TX'S TWICE WEEKLY  . Renal calculus, left   . Sleep apnea 02/23/2015   No longer on CPAP    Past Surgical History:  Procedure Laterality Date  . ABDOMINAL HYSTERECTOMY  1994   W/ UNILATERAL SALPINGOOPHORECTOMY  . BREAST BIOPSY Right 07/14/2019   Affirm Bx- Coil clip-Path pending  . BREAST BIOPSY Left 07/14/2019   Affirm Bx- X Clip- path pending  . COLONOSCOPY WITH PROPOFOL N/A 03/04/2015   Procedure: COLONOSCOPY WITH PROPOFOL;  Surgeon: Midge Miniumarren Wohl, MD;  [facility];  Service: Endoscopy;  Laterality: N/A;  . CYSTOSCOPY WITH RETROGRADE PYELOGRAM, URETEROSCOPY AND STENT PLACEMENT Left 12/25/2012   Procedure: CYSTOSCOPY WITH RETROGRADE PYELOGRAM, URETEROSCOPY AND STENT PLACEMENT;  Surgeon: Sebastian Acheheodore Manny, MD;  [facility] [place];  Service: Urology;  Laterality: Left;  90 MIN NEEDS DIG URETEROSCOPE   . HOLMIUM LASER APPLICATION Left 12/25/2012   Procedure: HOLMIUM LASER APPLICATION;  Surgeon: Sebastian Acheheodore Manny, MD;  [facility] [place];  Service: Urology;  Laterality: Left;  . TONSILLECTOMY  AGE 68   Social History   Socioeconomic History  . Marital status: Married    Spouse name: Not on file  . Number of children: 2  . Years of education: Administrative Degree 6 yr  . Highest education level: Master's degree (e.g., MA, MS, MEng, MEd, MSW, MBA)  Occupational History  . Occupation: Retired Geologist, engineering- Educator  Tobacco Use  . Smoking status: Never Smoker  . Smokeless tobacco: Never Used  Substance and Sexual Activity  . Alcohol use: No  . Drug use: No  . Sexual  activity: Not on file  Other Topics Concern  . Not on file  Social History Narrative  . Not on file   Social Determinants of Health   Financial Resource Strain:   . Difficulty of Paying Living Expenses: Not on file  Food Insecurity:   . Worried About Programme researcher, broadcasting/film/video in the Last Year: Not on file  . Ran Out of Food in the Last Year: Not on file  Transportation Needs:   . Lack of Transportation (Medical): Not on file  . Lack of Transportation (Non-Medical): Not on  file  Physical Activity:   . Days of Exercise per Week: Not on file  . Minutes of Exercise per Session: Not on file  Stress:   . Feeling of Stress : Not on file  Social Connections:   . Frequency of Communication with Friends and Family: Not on file  . Frequency of Social Gatherings with Friends and Family: Not on file  . Attends Religious Services: Not on file  . Active Member of Clubs or Organizations: Not on file  . Attends Banker Meetings: Not on file  . Marital Status: Not on file  Intimate Partner Violence:   . Fear of Current or Ex-Partner: Not on file  . Emotionally Abused: Not on file  . Physically Abused: Not on file  . Sexually Abused: Not on file   Family History  Problem Relation Age of Onset  . Heart failure Mother   . Arthritis Mother   . Heart attack Mother 78       Secondary CVA  . Cancer Father        lung cancer  . Heart disease Sister 84  . Breast cancer Neg Hx    Current Outpatient Medications on File Prior to Visit  Medication Sig  . baclofen (LIORESAL) 10 MG tablet Take 0.5-1 tablets (5-10 mg total) by mouth 3 (three) times daily as needed for muscle spasms.  . Cholecalciferol (VITAMIN D3) 5000 units CAPS Take 1 capsule (5,000 Units total) by mouth daily. For 8 weeks, then start Vitamin D3 2,000 units daily (OTC) (Patient taking differently: Take 5,000 Units by mouth daily. For 8 weeks, then start Vitamin D3 2,000 units daily (OTC))  . hydrochlorothiazide (HYDRODIURIL) 12.5 MG tablet Take 1 tablet (12.5 mg total) by mouth daily as needed.  . Ixekizumab (TALTZ) 80 MG/ML SOAJ Inject into the skin. Every other week for 12 weeks, then once monthly for several months, per Dermatology (Dr Roseanne Reno, [facility])  . TREMFYA 100 MG/ML SOPN   . vitamin B-12 (CYANOCOBALAMIN) 1000 MCG tablet Take 1,000 mcg by mouth daily.  . meloxicam (MOBIC) 15 MG tablet Take 1 tablet (15 mg total) by mouth daily. With food. Do not take Ibuprofen, Advil,  Aleve on this med. (Patient not taking: Reported on 08/11/2019)   No current facility-administered medications on file prior to visit.    Review of Systems  Constitutional: Negative for activity change, appetite change, chills, diaphoresis, fatigue and fever.  HENT: Negative for congestion and hearing loss.   Eyes: Negative for discharge and visual disturbance.  Respiratory: Negative for apnea, cough, chest tightness, shortness of breath and wheezing.   Cardiovascular: Negative for chest pain, palpitations and leg swelling.  Gastrointestinal: Negative for abdominal pain, anal bleeding, blood in stool, constipation, diarrhea, nausea and vomiting.  Endocrine: Negative for cold intolerance.  Genitourinary: Negative for difficulty urinating, dysuria, frequency and hematuria.  Musculoskeletal: Negative for arthralgias, back pain and neck pain.  Skin: Negative for rash.  Allergic/Immunologic: Negative for environmental allergies.  Neurological: Negative for dizziness, weakness, light-headedness, numbness and headaches.  Hematological: Negative for adenopathy.  Psychiatric/Behavioral: Negative for behavioral problems, dysphoric mood and sleep disturbance. The patient is not nervous/anxious.    Per HPI unless specifically indicated above      Objective:    BP 138/70 (BP Location: Left Arm, Cuff Size: Normal)   Pulse 67   Temp 97.8 F (36.6 C) (Oral)   Resp 16   Ht 5\' 3"  (1.6 m)   Wt 235 lb 9.6 oz (106.9 kg)   BMI 41.73 kg/m   Wt Readings from Last 3 Encounters:  08/11/19 235 lb 9.6 oz (106.9 kg)  02/09/19 237 lb (107.5 kg)  08/05/18 250 lb 3.2 oz (113.5 kg)    Physical Exam Vitals and nursing note reviewed.  Constitutional:      General: She is not in acute distress.    Appearance: She is well-developed. She is not diaphoretic.     Comments: Well-appearing, comfortable, cooperative  HENT:     Head: Normocephalic and atraumatic.  Eyes:     General:        Right eye: No  discharge.        Left eye: No discharge.     Conjunctiva/sclera: Conjunctivae normal.     Pupils: Pupils are equal, round, and reactive to light.  Neck:     Thyroid: No thyromegaly.  Cardiovascular:     Rate and Rhythm: Normal rate and regular rhythm.     Heart sounds: Normal heart sounds. No murmur.  Pulmonary:     Effort: Pulmonary effort is normal. No respiratory distress.     Breath sounds: Normal breath sounds. No wheezing or rales.  Abdominal:     General: Bowel sounds are normal. There is no distension.     Palpations: Abdomen is soft. There is no mass.     Tenderness: There is no abdominal tenderness.  Musculoskeletal:        General: No tenderness. Normal range of motion.     Cervical back: Normal range of motion and neck supple.     Comments: Upper / Lower Extremities: - Normal muscle tone, strength bilateral upper extremities 5/5, lower extremities 5/5  Lymphadenopathy:     Cervical: No cervical adenopathy.  Skin:    General: Skin is warm and dry.     Findings: No erythema or rash.  Neurological:     Mental Status: She is alert and oriented to person, place, and time.     Comments: Distal sensation intact to light touch all extremities  Psychiatric:        Behavior: Behavior normal.     Comments: Well groomed, good eye contact, normal speech and thoughts        Results for orders placed or performed in visit on 08/04/19  TSH  Result Value Ref Range   TSH 1.06 0.40 - 4.50 mIU/L  Vitamin B12  Result Value Ref Range   Vitamin B-12 566 200 - 1,100 pg/mL  VITAMIN D 25 Hydroxy (Vit-D Deficiency, Fractures)  Result Value Ref Range   Vit D, 25-Hydroxy 22 (L) 30 - 100 ng/mL  Lipid panel  Result Value Ref Range   Cholesterol 203 (H) <200 mg/dL   HDL 41 (L) > OR = 50 mg/dL   Triglycerides 105 <150 mg/dL   LDL Cholesterol (Calc) 140 (H) mg/dL (calc)   Total CHOL/HDL Ratio 5.0 (H) <5.0 (calc)   Non-HDL Cholesterol (Calc)  162 (H) <130 mg/dL (calc)  COMPLETE  METABOLIC PANEL WITH GFR  Result Value Ref Range   Glucose, Bld 96 65 - 99 mg/dL   BUN 8 7 - 25 mg/dL   Creat 9.62 9.52 - 8.41 mg/dL   GFR, Est Non African American 83 > OR = 60 mL/min/1.85m2   GFR, Est African American 96 > OR = 60 mL/min/1.54m2   BUN/Creatinine Ratio NOT APPLICABLE 6 - 22 (calc)   Sodium 139 135 - 146 mmol/L   Potassium 4.1 3.5 - 5.3 mmol/L   Chloride 104 98 - 110 mmol/L   CO2 26 20 - 32 mmol/L   Calcium 9.0 8.6 - 10.4 mg/dL   Total Protein 7.3 6.1 - 8.1 g/dL   Albumin 4.0 3.6 - 5.1 g/dL   Globulin 3.3 1.9 - 3.7 g/dL (calc)   AG Ratio 1.2 1.0 - 2.5 (calc)   Total Bilirubin 0.3 0.2 - 1.2 mg/dL   Alkaline phosphatase (APISO) 92 37 - 153 U/L   AST 15 10 - 35 U/L   ALT 6 6 - 29 U/L  CBC with Differential/Platelet  Result Value Ref Range   WBC 3.2 (L) 3.8 - 10.8 Thousand/uL   RBC 4.62 3.80 - 5.10 Million/uL   Hemoglobin 14.4 11.7 - 15.5 g/dL   HCT 32.4 40.1 - 02.7 %   MCV 94.2 80.0 - 100.0 fL   MCH 31.2 27.0 - 33.0 pg   MCHC 33.1 32.0 - 36.0 g/dL   RDW 25.3 66.4 - 40.3 %   Platelets 132 (L) 140 - 400 Thousand/uL   MPV 11.2 7.5 - 12.5 fL   Neutro Abs 1,853 1,500 - 7,800 cells/uL   Lymphs Abs 704 (L) 850 - 3,900 cells/uL   Absolute Monocytes 576 200 - 950 cells/uL   Eosinophils Absolute 38 15 - 500 cells/uL   Basophils Absolute 29 0 - 200 cells/uL   Neutrophils Relative % 57.9 %   Total Lymphocyte 22.0 %   Monocytes Relative 18.0 %   Eosinophils Relative 1.2 %   Basophils Relative 0.9 %  Hemoglobin A1c  Result Value Ref Range   Hgb A1c MFr Bld 5.7 (H) <5.7 % of total Hgb   Mean Plasma Glucose 117 (calc)   eAG (mmol/L) 6.5 (calc)      Assessment & Plan:   Problem List Items Addressed This Visit    Vitamin D deficiency    Start OTC Vitamin D3 5,000 iu daily for 12 weeks then reduce to OTC Vitamin D3 2,000 iu daily for maintenance       Vitamin B12 deficiency   Pre-hypertension    Mild elevated, repeat is improved Significant risk factors as  previously documented, morbid obesity  Plan Continue monitoring BP May adjust med in future if need already on thiazide low dose - can increase to regular dosing vs other med      Pre-diabetes    Elevated A1c to 5.7 with Pre-DM Concern with obesity, HTN, HLD  Plan:  1. Not on any therapy currently  2. Encourage improved lifestyle - low carb, low sugar diet, reduce portion size, continue improving regular exercise 3. Follow-up q 6 mo lab       Morbid obesity with BMI of 40.0-44.9, adult (HCC)    Improving weight loss Lifestyle improved Down 15 lbs in 1 year.      Hyperlipidemia    Still mild to moderate elevated Lipids Last lipid 07/2019 Calculated ASCVD 10 yr risk score 10%  Plan: Discussed ASCVD  risk - START on initial therapy statin - Rosuvastatin  nightly - counseling on benefit risk, potential side effect, dosing strategy May STOP ASA 81 as long as taking Statin Encourage improved lifestyle - low carb/cholesterol, reduce portion size, continue improving regular exercise  Check labs CMET Lipid in 3 months      Relevant Medications   rosuvastatin (CRESTOR) 10 MG tablet    Other Visit Diagnoses    Annual physical exam    -  Primary      Updated Health Maintenance information Reviewed recent lab results with patient Encouraged improvement to lifestyle with diet and exercise - Goal of weight loss   Meds ordered this encounter  Medications  . rosuvastatin (CRESTOR) 10 MG tablet    Sig: Take 1 tablet (10 mg total) by mouth at bedtime.    Dispense:  90 tablet    Refill:  3     Follow up plan: Return in about 3 months (around 11/09/2019) for 3 month follow-up HLD lab results on statin.   Future orders for A1c CMET, Lipid, Vitamin D 3 months 11/02/19  Saralyn Pilar, DO [facility] 08/11/2019, 9:23 AM

## 2019-08-11 NOTE — Assessment & Plan Note (Signed)
Mild elevated, repeat is improved Significant risk factors as previously documented, morbid obesity  Plan Continue monitoring BP May adjust med in future if need already on thiazide low dose - can increase to regular dosing vs other med

## 2019-08-11 NOTE — Patient Instructions (Addendum)
Thank you for coming to the office today.  Vitamin D mildly low If you want - I would recommend  - Start OTC Vitamin D3 5,000 iu daily for 12 weeks then reduce to OTC Vitamin D3 2,000 iu daily for maintenance  We can check lab again.  You are at increased risk of future Cardiovascular complications such as Heart Attack or Stroke from an artery blockage due to abnormal cholesterol and/or risk factors. - As discussed, Statin Cholesterol pills both can both LOWER cholesterol and REDUCE this future risk of heart attack and stroke - Start Rosuvastatin (generic Crestor) 10mg  pill once at bedtime every night  If you develop mild aches or pains in muscle or joint that does NOT improve or go away after first 3-4 weeks then this may require to adjust the dose. First I would recommend STOPPING the medication for a few weeks until your ache and pain symptoms completely RESOLVE. Then you can restart at a LOWER DOSE either HALF a pill at bedtime every night or LESS OFTEN such as one pill a week only and then gradually increase to every other day or max dose of 3 times a week  Lastly, sometimes we need to try other versions of this medicine to find one that works for you and does not cause side effects.  DUE for FASTING BLOOD WORK (no food or drink after midnight before the lab appointment, only water or coffee without cream/sugar on the morning of)  SCHEDULE "Lab Only" visit in the morning at the clinic for lab draw in 3 MONTHS   - Make sure Lab Only appointment is at about 1 week before your next appointment, so that results will be available  For Lab Results, once available within 2-3 days of blood draw, you can can log in to MyChart online to view your results and a brief explanation. Also, we can discuss results at next follow-up visit.   Please schedule a Follow-up Appointment to: Return in about 3 months (around 11/09/2019) for 3 month follow-up HLD lab results on statin.  If you have any other  questions or concerns, please feel free to call the office or send a message through MyChart. You may also schedule an earlier appointment if necessary.  Additionally, you may be receiving a survey about your experience at our office within a few days to 1 week by e-mail or mail. We value your feedback.  11/11/2019, DO [facility]  --------------------------------------------------------------------------  You may have coronavirus / COVID19  [place] COVID19 Testing Information  COVID-19 Testing By Appointment Only  Indoor Test site now. No longer outdoor drive up testing.  Online scheduling can be done online at [facility] or by texting "COVID" to [phone].  Test result may take 2-7 days to result. You will be notified by MyChart or by Phone.  Phone: [phone] [facility] contact, can inquire about status of test result)  If negative test - they will call you with result. If abnormal or positive test you will be notified as well and our office will contact you to help further with treatment plan.  May take Tylenol as needed for aches pains and fever. Prefer to avoid Ibuprofen if can help it, to avoid complication from virus.  REQUIRED self quarantine to AVOID POTENTIAL SPREAD - advised to avoid all exposure with others while during treatment. Should continue to quarantine for up to 7-14 days, pending resolution of symptoms, if symptoms resolve by 7 days and is  afebrile >3 days - may STOP self quarantine at that time.  If symptoms do not resolve or significantly improve OR if WORSENING - fever / cough - or worsening shortness of breath - then should contact us and seek advice on next steps in treatment at home vs where/when to seek care at Urgent Care or Hospital ED for further intervention

## 2019-08-25 ENCOUNTER — Ambulatory Visit: Payer: Medicare Other | Attending: Internal Medicine

## 2019-08-25 DIAGNOSIS — Z20822 Contact with and (suspected) exposure to covid-19: Secondary | ICD-10-CM

## 2019-08-26 LAB — NOVEL CORONAVIRUS, NAA: SARS-CoV-2, NAA: NOT DETECTED

## 2019-09-01 ENCOUNTER — Ambulatory Visit (INDEPENDENT_AMBULATORY_CARE_PROVIDER_SITE_OTHER): Payer: Medicare PPO

## 2019-09-01 ENCOUNTER — Telehealth: Payer: Self-pay

## 2019-09-01 VITALS — Ht 64.0 in | Wt 234.0 lb

## 2019-09-01 DIAGNOSIS — Z0000 Encounter for general adult medical examination without abnormal findings: Secondary | ICD-10-CM

## 2019-09-01 DIAGNOSIS — L400 Psoriasis vulgaris: Secondary | ICD-10-CM | POA: Diagnosis not present

## 2019-09-01 NOTE — Progress Notes (Signed)
Subjective:   SHUNTAVIA YERBY is a 69 y.o. female who presents for Medicare Annual (Subsequent) preventive examination.  Review of Systems:   Cardiac Risk Factors include: advanced age (>77men, >40 women);dyslipidemia;hypertension     Objective:     Vitals: Ht 5\' 4"  (1.626 m)   Wt 234 lb (106.1 kg)   BMI 40.17 kg/m   Body mass index is 40.17 kg/m.  Advanced Directives 09/01/2019 08/05/2018 12/05/2017 09/16/2017 07/30/2017 05/18/2016 12/25/2012  Does Patient Have a Medical Advance Directive? Yes Yes Yes Yes Yes No Patient does not have advance directive;Patient would not like information  Type of Advance Directive Living will;Healthcare Power of 02/24/2013 Power of [address];Living will - - Healthcare Power of [place];Living will - -  Does patient want to make changes to medical advance directive? - - - No - Patient declined - - -  Copy of Healthcare Power of Attorney in Chart? No - copy requested No - copy requested - - No - copy requested - -  Would patient like information on creating a medical advance directive? - - - - - Yes - Educational materials given -  Pre-existing out of facility DNR order (yellow form or pink MOST form) - - - - - - No    Tobacco Social History   Tobacco Use  Smoking Status Never Smoker  Smokeless Tobacco Never Used     Counseling given: Not Answered   Clinical Intake:  Pre-visit preparation completed: Yes  Pain : No/denies pain     Nutritional Status: BMI > 30  Obese Nutritional Risks: None Diabetes: No  How often do you need to have someone help you when you read instructions, pamphlets, or other written materials from your doctor or pharmacy?: 1 - Never  Interpreter Needed?: No  Information entered by :: Clare Casto,LPN  Past Medical History:  Diagnosis Date  . Arthritis   . Left ureteral calculus   . Psoriasis SEVERE--  RECEIVES LASE TX'S TWICE WEEKLY  . Renal calculus, left   . Sleep apnea 02/23/2015   No longer on  CPAP   Past Surgical History:  Procedure Laterality Date  . ABDOMINAL HYSTERECTOMY  1994   W/ UNILATERAL SALPINGOOPHORECTOMY  . BREAST BIOPSY Right 07/14/2019   Affirm Bx- Coil clip-Path pending  . BREAST BIOPSY Left 07/14/2019   Affirm Bx- X Clip- path pending  . COLONOSCOPY WITH PROPOFOL N/A 03/04/2015   Procedure: COLONOSCOPY WITH PROPOFOL;  Surgeon: 05/05/2015, MD;  Location: [facility];  Service: Endoscopy;  Laterality: N/A;  . CYSTOSCOPY WITH RETROGRADE PYELOGRAM, URETEROSCOPY AND STENT PLACEMENT Left 12/25/2012   Procedure: CYSTOSCOPY WITH RETROGRADE PYELOGRAM, URETEROSCOPY AND STENT PLACEMENT;  Surgeon: 02/24/2013, MD;  Location: [facility];  Service: Urology;  Laterality: Left;  90 MIN NEEDS DIG URETEROSCOPE   . HOLMIUM LASER APPLICATION Left 12/25/2012   Procedure: HOLMIUM LASER APPLICATION;  Surgeon: 02/24/2013, MD;  Location: [facility];  Service: Urology;  Laterality: Left;  . TONSILLECTOMY  AGE 37   Family History  Problem Relation Age of Onset  . Heart failure Mother   . Arthritis Mother   . Heart attack Mother 52       Secondary CVA  . Cancer Father        lung cancer  . Heart disease Sister 67  . Breast cancer Neg Hx    Social History   Socioeconomic History  . Marital status: Married    Spouse name: Not on file  .  Number of children: 2  . Years of education: Administrative Degree 6 yr  . Highest education level: Master's degree (e.g., MA, MS, MEng, MEd, MSW, MBA)  Occupational History  . Occupation: Retired Geologist, engineering  Tobacco Use  . Smoking status: Never Smoker  . Smokeless tobacco: Never Used  Substance and Sexual Activity  . Alcohol use: No  . Drug use: No  . Sexual activity: Not on file  Other Topics Concern  . Not on file  Social History Narrative  . Not on file   Social Determinants of Health   Financial Resource Strain:   . Difficulty of Paying Living Expenses: Not on file  Food Insecurity:     . Worried About Programme researcher, broadcasting/film/video in the Last Year: Not on file  . Ran Out of Food in the Last Year: Not on file  Transportation Needs:   . Lack of Transportation (Medical): Not on file  . Lack of Transportation (Non-Medical): Not on file  Physical Activity:   . Days of Exercise per Week: Not on file  . Minutes of Exercise per Session: Not on file  Stress:   . Feeling of Stress : Not on file  Social Connections:   . Frequency of Communication with Friends and Family: Not on file  . Frequency of Social Gatherings with Friends and Family: Not on file  . Attends Religious Services: Not on file  . Active Member of Clubs or Organizations: Not on file  . Attends Banker Meetings: Not on file  . Marital Status: Not on file    Outpatient Encounter Medications as of 09/01/2019  Medication Sig  . Cholecalciferol (VITAMIN D3) 5000 units CAPS Take 1 capsule (5,000 Units total) by mouth daily. For 8 weeks, then start Vitamin D3 2,000 units daily (OTC) (Patient taking differently: Take 5,000 Units by mouth daily. For 8 weeks, then start Vitamin D3 2,000 units daily (OTC))  . hydrochlorothiazide (HYDRODIURIL) 12.5 MG tablet Take 1 tablet (12.5 mg total) by mouth daily as needed.  . rosuvastatin (CRESTOR) 10 MG tablet Take 1 tablet (10 mg total) by mouth at bedtime.  Marland Kitchen TREMFYA 100 MG/ML SOPN   . vitamin B-12 (CYANOCOBALAMIN) 1000 MCG tablet Take 1,000 mcg by mouth daily.  . baclofen (LIORESAL) 10 MG tablet Take 0.5-1 tablets (5-10 mg total) by mouth 3 (three) times daily as needed for muscle spasms. (Patient not taking: Reported on 09/01/2019)  . meloxicam (MOBIC) 15 MG tablet Take 1 tablet (15 mg total) by mouth daily. With food. Do not take Ibuprofen, Advil, Aleve on this med. (Patient not taking: Reported on 09/01/2019)  . [DISCONTINUED] Ixekizumab (TALTZ) 80 MG/ML SOAJ Inject into the skin. Every other week for 12 weeks, then once monthly for several months, per Dermatology (Dr Roseanne Reno,  [facility])   No facility-administered encounter medications on file as of 09/01/2019.    Activities of Daily Living In your present state of health, do you have any difficulty performing the following activities: 09/01/2019 08/11/2019  Hearing? N N  Comment no hearing aids -  Vision? N N  Comment eyeglasses, goes to my eye dr -  Difficulty concentrating or making decisions? N N  Walking or climbing stairs? N N  Dressing or bathing? N N  Doing errands, shopping? N N  Preparing Food and eating ? N -  Using the Toilet? N -  In the past six months, have you accidently leaked urine? Y -  Comment coughing -  Do  you have problems with loss of bowel control? N -  Managing your Medications? N -  Managing your Finances? N -  Housekeeping or managing your Housekeeping? N -  Some recent data might be hidden    Patient Care Team: Olin Hauser, DO as PCP - General (Family Medicine) Brendolyn Patty, MD (Dermatology) Marlowe Sax, MD as Referring Physician (Internal Medicine)    Assessment:   This is a routine wellness examination for Marbeth.  Exercise Activities and Dietary recommendations Current Exercise Habits: The patient does not participate in regular exercise at present, Intensity: Mild, Exercise limited by: None identified  Goals    . DIET - INCREASE WATER INTAKE     Recommend drinking at least 6-8 glasses of water a day        Fall Risk: Fall Risk  09/01/2019 08/11/2019 02/09/2019 08/05/2018 08/05/2018  Falls in the past year? 0 0 0 0 0  Number falls in past yr: 0 0 - - -  Injury with Fall? 0 - - - -  Follow up - Falls evaluation completed Falls evaluation completed Falls evaluation completed -    FALL RISK PREVENTION PERTAINING TO THE HOME:  Any stairs in or around the home? Yes  If so, are there any without handrails? No   Home free of loose throw rugs in walkways, pet beds, electrical cords, etc? Yes  Adequate lighting in your  home to reduce risk of falls? No   ASSISTIVE DEVICES UTILIZED TO PREVENT FALLS:  Life alert? No  Use of a cane, walker or w/c? No  Grab bars in the bathroom? No  Shower chair or bench in shower? No  Elevated toilet seat or a handicapped toilet? No   DME ORDERS:  DME order needed?  No   TIMED UP AND GO:  Unable to perform    Depression Screen PHQ 2/9 Scores 09/01/2019 08/11/2019 02/09/2019 08/05/2018  PHQ - 2 Score 0 0 0 0  PHQ- 9 Score - - - -     Cognitive Function     6CIT Screen 08/05/2018 07/30/2017  What Year? 0 points 0 points  What month? 0 points 0 points  What time? 0 points 0 points  Count back from 20 0 points 0 points  Months in reverse 0 points 0 points  Repeat phrase 0 points 2 points  Total Score 0 2    Immunization History  Administered Date(s) Administered  . Fluad Quad(high Dose 65+) 06/12/2019  . Influenza, High Dose Seasonal PF 07/27/2017, 07/29/2018  . Influenza-Unspecified 04/27/2016, 07/27/2017  . Pneumococcal Conjugate-13 07/31/2017  . Tdap 08/27/2009  . Zoster 08/27/2013    Qualifies for Shingles Vaccine? Yes  Zostavax completed n/a . Due for Shingrix. Education has been provided regarding the importance of this vaccine. Pt has been advised to call insurance company to determine out of pocket expense. Advised may also receive vaccine at local pharmacy or Health Dept. Verbalized acceptance and understanding.  Tdap: Discussed need for TD/TDAP vaccine, patient verbalized understanding that this is not covered as a preventative with there insurance and to call the office if she develops any new skin injuries, ie: cuts, scrapes, bug bites, or open wounds.  Flu Vaccine: up to date   Pneumococcal Vaccine: up to date   Screening Tests Health Maintenance  Topic Date Due  . TETANUS/TDAP  06/01/2026 (Originally 08/27/2021)  . MAMMOGRAM  07/07/2021  . DEXA SCAN  08/27/2023  . COLONOSCOPY  03/03/2025  . INFLUENZA VACCINE  Completed  .  Hepatitis C  Screening  Completed    Cancer Screenings:  Colorectal Screening: Completed 03/04/2015. Repeat every 10 years  Mammogram: Completed 07/08/2019 Repeat every year  Bone Density: Completed 08/26/2013.   Lung Cancer Screening: (Low Dose CT Chest recommended if Age 3-80 years, 30 pack-year currently smoking OR have quit w/in 15years.) does not qualify.   Additional Screening:  Hepatitis C Screening: does qualify; Completed 2017  Vision Screening: Recommended annual ophthalmology exams for early detection of glaucoma and other disorders of the eye. Is the patient up to date with their annual eye exam?  Yes  Who is the provider or what is the name of the office in which the pt attends annual eye exams? My eye dr   Dental Screening: Recommended annual dental exams for proper oral hygiene  Community Resource Referral:  CRR required this visit?  No       Plan:  I have personally reviewed and addressed the Medicare Annual Wellness questionnaire and have noted the following in the patient's chart:  A. Medical and social history B. Use of alcohol, tobacco or illicit drugs  C. Current medications and supplements D. Functional ability and status E.  Nutritional status F.  Physical activity G. Advance directives H. List of other physicians I.  Hospitalizations, surgeries, and ER visits in previous 12 months J.  Vitals K. Screenings such as hearing and vision if needed, cognitive and depression L. Referrals and appointments   In addition, I have reviewed and discussed with patient certain preventive protocols, quality metrics, and best practice recommendations. A written personalized care plan for preventive services as well as general preventive health recommendations were provided to patient.  Signed,    Collene Schlichter, LPN  03/31/2777 Nurse Health Advisor   Nurse Notes: none

## 2019-09-01 NOTE — Telephone Encounter (Signed)
Caller aware of negative result

## 2019-09-01 NOTE — Patient Instructions (Signed)
Morgan Mcclain , Thank you for taking time to come for your Medicare Wellness Visit. I appreciate your ongoing commitment to your health goals. Please review the following plan we discussed and let me know if I can assist you in the future.   Screening recommendations/referrals: Colonoscopy: completed 03/04/2015 Mammogram: completed 07/08/2019 Bone Density: completed 2014 Recommended yearly ophthalmology/optometry visit for glaucoma screening and checkup Recommended yearly dental visit for hygiene and checkup  Vaccinations: Influenza vaccine: up to date  Pneumococcal vaccine: up to date  Tdap vaccine: due now Shingles vaccine: shingrix eligible    Advanced directives: Please bring a copy of your health care power of attorney and living will to the office at your convenience.  Conditions/risks identified: none  Next appointment: follow up in one year for your annual wellness visit   Preventive Care 69 Years and Older, Female Preventive care refers to lifestyle choices and visits with your health care provider that can promote health and wellness. What does preventive care include?  A yearly physical exam. This is also called an annual well check.  Dental exams once or twice a year.  Routine eye exams. Ask your health care provider how often you should have your eyes checked.  Personal lifestyle choices, including:  Daily care of your teeth and gums.  Regular physical activity.  Eating a healthy diet.  Avoiding tobacco and drug use.  Limiting alcohol use.  Practicing safe sex.  Taking low-dose aspirin every day.  Taking vitamin and mineral supplements as recommended by your health care provider. What happens during an annual well check? The services and screenings done by your health care provider during your annual well check will depend on your age, overall health, lifestyle risk factors, and family history of disease. Counseling  Your health care provider may ask you  questions about your:  Alcohol use.  Tobacco use.  Drug use.  Emotional well-being.  Home and relationship well-being.  Sexual activity.  Eating habits.  History of falls.  Memory and ability to understand (cognition).  Work and work Astronomer.  Reproductive health. Screening  You may have the following tests or measurements:  Height, weight, and BMI.  Blood pressure.  Lipid and cholesterol levels. These may be checked every 5 years, or more frequently if you are over 13 years old.  Skin check.  Lung cancer screening. You may have this screening every year starting at age 69 if you have a 30-pack-year history of smoking and currently smoke or have quit within the past 15 years.  Fecal occult blood test (FOBT) of the stool. You may have this test every year starting at age 69.  Flexible sigmoidoscopy or colonoscopy. You may have a sigmoidoscopy every 5 years or a colonoscopy every 10 years starting at age 69.  Hepatitis C blood test.  Hepatitis B blood test.  Sexually transmitted disease (STD) testing.  Diabetes screening. This is done by checking your blood sugar (glucose) after you have not eaten for a while (fasting). You may have this done every 1-3 years.  Bone density scan. This is done to screen for osteoporosis. You may have this done starting at age 69.  Mammogram. This may be done every 1-2 years. Talk to your health care provider about how often you should have regular mammograms. Talk with your health care provider about your test results, treatment options, and if necessary, the need for more tests. Vaccines  Your health care provider may recommend certain vaccines, such as:  Influenza vaccine. This  is recommended every year.  Tetanus, diphtheria, and acellular pertussis (Tdap, Td) vaccine. You may need a Td booster every 10 years.  Zoster vaccine. You may need this after age 69.  Pneumococcal 13-valent conjugate (PCV13) vaccine. One dose is  recommended after age 69.  Pneumococcal polysaccharide (PPSV23) vaccine. One dose is recommended after age 69. Talk to your health care provider about which screenings and vaccines you need and how often you need them. This information is not intended to replace advice given to you by your health care provider. Make sure you discuss any questions you have with your health care provider. Document Released: 09/09/2015 Document Revised: 05/02/2016 Document Reviewed: 06/14/2015 Elsevier Interactive Patient Education  2017 [place] Prevention in the Home Falls can cause injuries. They can happen to people of all ages. There are many things you can do to make your home safe and to help prevent falls. What can I do on the outside of my home?  Regularly fix the edges of walkways and driveways and fix any cracks.  Remove anything that might make you trip as you walk through a door, such as a raised step or threshold.  Trim any bushes or trees on the path to your home.  Use bright outdoor lighting.  Clear any walking paths of anything that might make someone trip, such as rocks or tools.  Regularly check to see if handrails are loose or broken. Make sure that both sides of any steps have handrails.  Any raised decks and porches should have guardrails on the edges.  Have any leaves, snow, or ice cleared regularly.  Use sand or salt on walking paths during winter.  Clean up any spills in your garage right away. This includes oil or grease spills. What can I do in the bathroom?  Use night lights.  Install grab bars by the toilet and in the tub and shower. Do not use towel bars as grab bars.  Use non-skid mats or decals in the tub or shower.  If you need to sit down in the shower, use a plastic, non-slip stool.  Keep the floor dry. Clean up any water that spills on the floor as soon as it happens.  Remove soap buildup in the tub or shower regularly.  Attach bath mats  securely with double-sided non-slip rug tape.  Do not have throw rugs and other things on the floor that can make you trip. What can I do in the bedroom?  Use night lights.  Make sure that you have a light by your bed that is easy to reach.  Do not use any sheets or blankets that are too big for your bed. They should not hang down onto the floor.  Have a firm chair that has side arms. You can use this for support while you get dressed.  Do not have throw rugs and other things on the floor that can make you trip. What can I do in the kitchen?  Clean up any spills right away.  Avoid walking on wet floors.  Keep items that you use a lot in easy-to-reach places.  If you need to reach something above you, use a strong step stool that has a grab bar.  Keep electrical cords out of the way.  Do not use floor polish or wax that makes floors slippery. If you must use wax, use non-skid floor wax.  Do not have throw rugs and other things on the floor that can make you  trip. What can I do with my stairs?  Do not leave any items on the stairs.  Make sure that there are handrails on both sides of the stairs and use them. Fix handrails that are broken or loose. Make sure that handrails are as long as the stairways.  Check any carpeting to make sure that it is firmly attached to the stairs. Fix any carpet that is loose or worn.  Avoid having throw rugs at the top or bottom of the stairs. If you do have throw rugs, attach them to the floor with carpet tape.  Make sure that you have a light switch at the top of the stairs and the bottom of the stairs. If you do not have them, ask someone to add them for you. What else can I do to help prevent falls?  Wear shoes that:  Do not have high heels.  Have rubber bottoms.  Are comfortable and fit you well.  Are closed at the toe. Do not wear sandals.  If you use a stepladder:  Make sure that it is fully opened. Do not climb a closed  stepladder.  Make sure that both sides of the stepladder are locked into place.  Ask someone to hold it for you, if possible.  Clearly mark and make sure that you can see:  Any grab bars or handrails.  First and last steps.  Where the edge of each step is.  Use tools that help you move around (mobility aids) if they are needed. These include:  Canes.  Walkers.  Scooters.  Crutches.  Turn on the lights when you go into a dark area. Replace any light bulbs as soon as they burn out.  Set up your furniture so you have a clear path. Avoid moving your furniture around.  If any of your floors are uneven, fix them.  If there are any pets around you, be aware of where they are.  Review your medicines with your doctor. Some medicines can make you feel dizzy. This can increase your chance of falling. Ask your doctor what other things that you can do to help prevent falls. This information is not intended to replace advice given to you by your health care provider. Make sure you discuss any questions you have with your health care provider. Document Released: 06/09/2009 Document Revised: 01/19/2016 Document Reviewed: 09/17/2014 Elsevier Interactive Patient Education  2017 [employer].

## 2019-09-23 ENCOUNTER — Ambulatory Visit: Payer: Medicare PPO | Attending: Internal Medicine

## 2019-09-23 DIAGNOSIS — Z20822 Contact with and (suspected) exposure to covid-19: Secondary | ICD-10-CM | POA: Diagnosis not present

## 2019-09-24 LAB — NOVEL CORONAVIRUS, NAA: SARS-CoV-2, NAA: NOT DETECTED

## 2019-10-27 DIAGNOSIS — L400 Psoriasis vulgaris: Secondary | ICD-10-CM | POA: Diagnosis not present

## 2019-11-02 ENCOUNTER — Other Ambulatory Visit: Payer: Self-pay

## 2019-11-02 ENCOUNTER — Other Ambulatory Visit: Payer: Medicare PPO

## 2019-11-02 DIAGNOSIS — E782 Mixed hyperlipidemia: Secondary | ICD-10-CM

## 2019-11-02 DIAGNOSIS — R7303 Prediabetes: Secondary | ICD-10-CM | POA: Diagnosis not present

## 2019-11-02 DIAGNOSIS — E559 Vitamin D deficiency, unspecified: Secondary | ICD-10-CM

## 2019-11-03 LAB — LIPID PANEL
Cholesterol: 163 mg/dL (ref <200)
HDL: 46 mg/dL — ABNORMAL LOW (ref >=50)
LDL Cholesterol (Calc): 103 mg/dL (calc) — ABNORMAL HIGH
Non-HDL Cholesterol (Calc): 117 mg/dL (calc) (ref <130)
Total CHOL/HDL Ratio: 3.5 (calc) (ref <5.0)
Triglycerides: 58 mg/dL (ref <150)

## 2019-11-03 LAB — COMPLETE METABOLIC PANEL WITH GFR
AG Ratio: 1.2 (calc) (ref 1.0–2.5)
ALT: 8 U/L (ref 6–29)
AST: 14 U/L (ref 10–35)
Albumin: 3.9 g/dL (ref 3.6–5.1)
Alkaline phosphatase (APISO): 90 U/L (ref 37–153)
BUN: 15 mg/dL (ref 7–25)
CO2: 30 mmol/L (ref 20–32)
Calcium: 9.2 mg/dL (ref 8.6–10.4)
Chloride: 106 mmol/L (ref 98–110)
Creat: 0.63 mg/dL (ref 0.50–0.99)
GFR, Est African American: 107 mL/min/{1.73_m2} (ref >=60)
GFR, Est Non African American: 92 mL/min/{1.73_m2} (ref >=60)
Globulin: 3.3 g/dL (calc) (ref 1.9–3.7)
Glucose, Bld: 94 mg/dL (ref 65–99)
Potassium: 4.3 mmol/L (ref 3.5–5.3)
Sodium: 140 mmol/L (ref 135–146)
Total Bilirubin: 0.4 mg/dL (ref 0.2–1.2)
Total Protein: 7.2 g/dL (ref 6.1–8.1)

## 2019-11-03 LAB — VITAMIN D 25 HYDROXY (VIT D DEFICIENCY, FRACTURES): Vit D, 25-Hydroxy: 20 ng/mL — ABNORMAL LOW (ref 30–100)

## 2019-11-03 LAB — HEMOGLOBIN A1C
Hgb A1c MFr Bld: 5.7 % of total Hgb — ABNORMAL HIGH (ref <5.7)
Mean Plasma Glucose: 117 (calc)
eAG (mmol/L): 6.5 (calc)

## 2019-11-09 ENCOUNTER — Other Ambulatory Visit: Payer: Self-pay

## 2019-11-09 ENCOUNTER — Ambulatory Visit (INDEPENDENT_AMBULATORY_CARE_PROVIDER_SITE_OTHER): Payer: Medicare PPO | Admitting: Family Medicine

## 2019-11-09 ENCOUNTER — Encounter: Payer: Self-pay | Admitting: Family Medicine

## 2019-11-09 ENCOUNTER — Other Ambulatory Visit: Payer: Self-pay | Admitting: Family Medicine

## 2019-11-09 VITALS — BP 133/68 | HR 52 | Temp 97.3°F | Resp 16 | Ht 63.0 in | Wt 238.0 lb

## 2019-11-09 DIAGNOSIS — Z6841 Body Mass Index (BMI) 40.0 and over, adult: Secondary | ICD-10-CM | POA: Diagnosis not present

## 2019-11-09 DIAGNOSIS — Z0000 Encounter for general adult medical examination without abnormal findings: Secondary | ICD-10-CM

## 2019-11-09 DIAGNOSIS — E559 Vitamin D deficiency, unspecified: Secondary | ICD-10-CM

## 2019-11-09 DIAGNOSIS — R7303 Prediabetes: Secondary | ICD-10-CM | POA: Diagnosis not present

## 2019-11-09 DIAGNOSIS — E782 Mixed hyperlipidemia: Secondary | ICD-10-CM

## 2019-11-09 DIAGNOSIS — R030 Elevated blood-pressure reading, without diagnosis of hypertension: Secondary | ICD-10-CM

## 2019-11-09 DIAGNOSIS — L4050 Arthropathic psoriasis, unspecified: Secondary | ICD-10-CM

## 2019-11-09 DIAGNOSIS — E538 Deficiency of other specified B group vitamins: Secondary | ICD-10-CM

## 2019-11-09 NOTE — Patient Instructions (Addendum)
Thank you for coming to the office today.  Rosuvastatin cholesterol is working great. Excellent cholesterol results.  Lipid Panel     Component Value Date/Time   CHOL 163 11/02/2019 0910   TRIG 58 11/02/2019 0910   HDL 46 (L) 11/02/2019 0910   CHOLHDL 3.5 11/02/2019 0910   VLDL 21 06/04/2016 1034   LDLCALC 103 (H) 11/02/2019 0910   For Vitamin D, slightly low at 20. Goal is to keep Vitamin D3 5,000 unit once daily for another 3 months, then can drop down to 2,000 unit pill once daily going forward.  DUE for FASTING BLOOD WORK (no food or drink after midnight before the lab appointment, only water or coffee without cream/sugar on the morning of)  SCHEDULE "Lab Only" visit in the morning at the clinic for lab draw in 9 MONTHS   - Make sure Lab Only appointment is at about 1 week before your next appointment, so that results will be available  For Lab Results, once available within 2-3 days of blood draw, you can can log in to MyChart online to view your results and a brief explanation. Also, we can discuss results at next follow-up visit.   Please schedule a Follow-up Appointment to: Return in about 9 months (around 08/10/2020) for Annual Physical.  If you have any other questions or concerns, please feel free to call the office or send a message through MyChart. You may also schedule an earlier appointment if necessary.  Additionally, you may be receiving a survey about your experience at our office within a few days to 1 week by e-mail or mail. We value your feedback.  Saralyn Pilar, DO [facility], [place]

## 2019-11-09 NOTE — Assessment & Plan Note (Signed)
Stable, elevated A1c to 5.7 with Pre-DM - encouraging maintain A1c without increase Concern with obesity, HTN, HLD  Plan:  1. Not on any therapy currently  2. Encourage improved lifestyle - low carb, low sugar diet, reduce portion size, continue improving regular exercise 3. Follow-up q 6 mo lab

## 2019-11-09 NOTE — Progress Notes (Signed)
Subjective:    Patient ID: Morgan Mcclain, female    DOB: 01/27/1951, 69 y.o.   MRN: [id number]  Morgan Mcclain is a 69 y.o. female presenting on 11/09/2019 for Hyperlipidemia    HPI    Pre-HTN Home BP readings have been normal. Not taking HCTZ regularly. She takesHCTZ 12.5mg  PRN 3-4 x week. Controlled edema LE on compression and diuretic PRN Denies CP, dyspnea, HA, dizziness / lightheadedness  Pre-Diabetes /HYPERLIPIDEMIA/ MORBID OBESITY BMI >42 Last result A1c still at 5.7, similar to prior results in mild Pre-DM Last lab lipid LDL down from 140 to 103 and TC 200 to 163 on statin now. Tolerating well, taking Rosuvastatin 10mg  daily Lifestyle - Weight up +4 lbs in 2 months - Diet:Still improving diet, reduced portion size - Exercise:goal to start moreregular walking and exercisestill Denies hypoglycemia  Vitamin D Deficiency Last lab result 20, previously 22. Missed some prior doses on Vitamin D  Health Maintenance: UTD Flu vaccine.  UTD Colonoscopy 2016  UTD Mammogram screening, abnormal, ultimately had bilateral breast biopsy, negative in 2020.  UTD COVID19 vaccine 2021.  Depression screen [facility] 2/9 11/09/2019 09/01/2019 08/11/2019  Decreased Interest 0 0 0  Down, Depressed, Hopeless 0 0 0  PHQ - 2 Score 0 0 0  Altered sleeping - - -  Tired, decreased energy - - -  Change in appetite - - -  Feeling bad or failure about yourself  - - -  Trouble concentrating - - -  Moving slowly or fidgety/restless - - -  Suicidal thoughts - - -  PHQ-9 Score - - -  Difficult doing work/chores - - -    Social History   Tobacco Use  . Smoking status: Never Smoker  . Smokeless tobacco: Never Used  Substance Use Topics  . Alcohol use: No  . Drug use: No    Review of Systems Per HPI unless specifically indicated above     Objective:    BP 133/68   Pulse (!) 52   Temp (!) 97.3 F (36.3 C) (Temporal)   Resp 16   Ht 5\' 3"  (1.6 m)   Wt 238 lb (108 kg)    BMI 42.16 kg/m   Wt Readings from Last 3 Encounters:  11/09/19 238 lb (108 kg)  09/01/19 234 lb (106.1 kg)  08/11/19 235 lb 9.6 oz (106.9 kg)    Physical Exam Vitals and nursing note reviewed.  Constitutional:      General: She is not in acute distress.    Appearance: She is well-developed. She is obese. She is not diaphoretic.     Comments: Well-appearing, comfortable, cooperative  HENT:     Head: Normocephalic and atraumatic.  Eyes:     General:        Right eye: No discharge.        Left eye: No discharge.     Conjunctiva/sclera: Conjunctivae normal.  Neck:     Thyroid: No thyromegaly.  Cardiovascular:     Rate and Rhythm: Normal rate and regular rhythm.     Heart sounds: Normal heart sounds. No murmur.  Pulmonary:     Effort: Pulmonary effort is normal. No respiratory distress.     Breath sounds: Normal breath sounds. No wheezing or rales.  Musculoskeletal:        General: Normal range of motion.     Cervical back: Normal range of motion and neck supple.  Lymphadenopathy:     Cervical: No cervical adenopathy.  Skin:  General: Skin is warm and dry.     Findings: No erythema or rash.  Neurological:     Mental Status: She is alert and oriented to person, place, and time.  Psychiatric:        Behavior: Behavior normal.     Comments: Well groomed, good eye contact, normal speech and thoughts        Results for orders placed or performed in visit on 11/02/19  [facility] - A1c LAB Hemoglobin A1C physical  Result Value Ref Range   Hgb A1c MFr Bld 5.7 (H) <5.7 % of total Hgb   Mean Plasma Glucose 117 (calc)   eAG (mmol/L) 6.5 (calc)  [facility] - Lipid panel physical  Result Value Ref Range   Cholesterol 163 <200 mg/dL   HDL 46 (L) > OR = 50 mg/dL   Triglycerides 58 <150 mg/dL   LDL Cholesterol (Calc) 103 (H) mg/dL (calc)   Total CHOL/HDL Ratio 3.5 <5.0 (calc)   Non-HDL Cholesterol (Calc) 117 <130 mg/dL (calc)  [facility] - VITAMIN D 25 Hydroxy (Vit-D Deficiency, Fractures)    Result Value Ref Range   Vit D, 25-Hydroxy 20 (L) 30 - 100 ng/mL  [facility] - CMET w/ GFR CMP Complete Metabolic Panel physical  Result Value Ref Range   Glucose, Bld 94 65 - 99 mg/dL   BUN 15 7 - 25 mg/dL   Creat 0.63 0.50 - 0.99 mg/dL   GFR, Est Non African American 92 > OR = 60 mL/min/1.10m2   GFR, Est African American 107 > OR = 60 mL/min/1.42m2   BUN/Creatinine Ratio NOT APPLICABLE 6 - 22 (calc)   Sodium 140 135 - 146 mmol/L   Potassium 4.3 3.5 - 5.3 mmol/L   Chloride 106 98 - 110 mmol/L   CO2 30 20 - 32 mmol/L   Calcium 9.2 8.6 - 10.4 mg/dL   Total Protein 7.2 6.1 - 8.1 g/dL   Albumin 3.9 3.6 - 5.1 g/dL   Globulin 3.3 1.9 - 3.7 g/dL (calc)   AG Ratio 1.2 1.0 - 2.5 (calc)   Total Bilirubin 0.4 0.2 - 1.2 mg/dL   Alkaline phosphatase (APISO) 90 37 - 153 U/L   AST 14 10 - 35 U/L   ALT 8 6 - 29 U/L      Assessment & Plan:   Problem List Items Addressed This Visit    Vitamin D deficiency    Mild low Vitamin D at 20 Restart Vitamin D3 5k daily for 3 months then down to 2k      Pre-hypertension    BP in normal range still Significant risk factors as previously documented, morbid obesity  Plan Continue monitoring BP      Pre-diabetes    Stable, elevated A1c to 5.7 with Pre-DM - encouraging maintain A1c without increase Concern with obesity, HTN, HLD  Plan:  1. Not on any therapy currently  2. Encourage improved lifestyle - low carb, low sugar diet, reduce portion size, continue improving regular exercise 3. Follow-up q 6 mo lab      Morbid obesity with BMI of 40.0-44.9, adult (HCC)    Overall weight loss Some recent mild gain Encourage lifestyle      Hyperlipidemia - Primary    Significantly improved cholesterol panel now on statin therapy Last lipid 10/2019 Calculated ASCVD 10 yr risk score 10%  Plan: CONTINUE Rosuvastatin 10mg  nightly Encourage improved lifestyle - low carb/cholesterol, reduce portion size, continue improving regular exercise         No  orders of the defined types were placed in this encounter.    Follow up plan: Return in about 9 months (around 08/10/2020) for Annual Physical.  Future labs ordered for 07/2020  Saralyn Pilar, DO [facility] 11/09/2019, 10:22 AM

## 2019-11-09 NOTE — Assessment & Plan Note (Signed)
Significantly improved cholesterol panel now on statin therapy Last lipid 10/2019 Calculated ASCVD 10 yr risk score 10%  Plan: CONTINUE Rosuvastatin 10mg  nightly Encourage improved lifestyle - low carb/cholesterol, reduce portion size, continue improving regular exercise

## 2019-11-09 NOTE — Assessment & Plan Note (Signed)
BP in normal range still Significant risk factors as previously documented, morbid obesity  Plan Continue monitoring BP

## 2019-11-09 NOTE — Assessment & Plan Note (Signed)
Mild low Vitamin D at 20 Restart Vitamin D3 5k daily for 3 months then down to 2k

## 2019-11-09 NOTE — Assessment & Plan Note (Signed)
Overall weight loss Some recent mild gain Encourage lifestyle

## 2019-12-22 ENCOUNTER — Ambulatory Visit (INDEPENDENT_AMBULATORY_CARE_PROVIDER_SITE_OTHER): Payer: Medicare PPO

## 2019-12-22 ENCOUNTER — Other Ambulatory Visit: Payer: Self-pay

## 2019-12-22 DIAGNOSIS — L400 Psoriasis vulgaris: Secondary | ICD-10-CM

## 2019-12-22 MED ORDER — GUSELKUMAB 100 MG/ML ~~LOC~~ SOSY
100.0000 mg | PREFILLED_SYRINGE | Freq: Once | SUBCUTANEOUS | Status: AC
  Administered 2019-12-22: 100 mg via SUBCUTANEOUS

## 2019-12-22 NOTE — Patient Instructions (Signed)
Guselkumab injection What is this medicine? GUSELKUMAB (goo ZELK ue mab) is used to treat plaque psoriasis and psoriatic arthritis. This medicine may be used for other purposes; ask your health care provider or pharmacist if you have questions. COMMON BRAND NAME(S): Tremfya What should I tell my health care provider before I take this medicine? They need to know if you have any of these conditions:  immune system problems  infection (especially a virus infection such as chickenpox, cold sores, or herpes) or history of infections  recently received or scheduled to receive a vaccine  tuberculosis, a positive skin test for tuberculosis, or have recently been in close contact with someone who has tuberculosis  an unusual or allergic reaction to guselkumab, other medicines, foods, dyes, or preservatives  pregnant or trying to get pregnant  breast-feeding How should I use this medicine? This medicine is for injection under the skin. It may be administered by a healthcare professional in a hospital or clinic setting or at home. If you get this medicine at home, you will be taught how to prepare and give this medicine. Use exactly as directed. Take your medicine at regular intervals. Do not take your medicine more often than directed. It is important that you put your used injectors, needles and syringes in a special sharps container. Do not put them in a trash can. If you do not have a sharps container, call your pharmacist or healthcare provider to get one. A special MedGuide will be given to you by the pharmacist with each prescription and refill. Be sure to read this information carefully each time. Talk to your pediatrician regarding the use of this medicine in children. Special care may be needed. Overdosage: If you think you have taken too much of this medicine contact a poison control center or emergency room at once. NOTE: This medicine is only for you. Do not share this medicine with  others. What if I miss a dose? It is important not to miss your dose. Call your doctor of health care professional if you are unable to keep an appointment. If you give yourself the medicine and you miss a dose, take it as soon as you can. Then, take your next dose at your regular scheduled time. What may interact with this medicine? Do not take this medicine with any of the following medications:  live virus vaccines This medicine may also interact with the following medications:  amoxapine  certain medicines for depression, anxiety, or psychotic disturbances like amitriptyline, clomipramine, desipramine, doxepin, imipramine, maprotiline, nortriptyline, protriptyline, trimipramine  codeine  inactivated vaccines  methadone  pimozide  thioridazine This list may not describe all possible interactions. Give your health care provider a list of all the medicines, herbs, non-prescription drugs, or dietary supplements you use. Also tell them if you smoke, drink alcohol, or use illegal drugs. Some items may interact with your medicine. What should I watch for while using this medicine? Your condition will be monitored carefully while you are receiving this medicine. Tell your doctor or healthcare professional if your symptoms do not start to get better or if they get worse. You will be tested for tuberculosis (TB) before you start this medicine. If your doctor prescribes any medicine for TB, you should start taking the TB medicine before starting this medicine. Make sure to finish the full course of TB medicine. Call your doctor or health care professional if you get a cold or other infection while receiving this medicine. Do not treat yourself. This   medicine may decrease your body's ability to fight infection. What side effects may I notice from receiving this medicine? Side effects that you should report to your doctor or health care professional as soon as possible:  allergic reactions like  skin rash, itching or hives, swelling of the face, lips, or tongue  breathing problems  chest pain or chest tightness  dizziness, feeling faint or lightheaded  signs and symptoms of infection like fever or chills; cough; sore throat; pain or trouble passing urine Side effects that usually do not require medical attention (report these to your doctor or health care professional if they continue or are bothersome):  diarrhea  headache  joint pain  pain, redness, irritation at site where injected This list may not describe all possible side effects. Call your doctor for medical advice about side effects. You may report side effects to FDA at 1-800-FDA-1088. Where should I keep my medicine? Keep out of the reach of children. Store unopened syringes or injectors in a refrigerator between 2 to 8 degrees C (36 to 46 degrees F). Keep in the original container until ready for use. Protect from light. Do not freeze. Do not shake. Prior to use, remove the syringe or injector from the refrigerator and use after 30 minutes at room temperature. Throw away any unused medicine after the expiration date on the label. NOTE: This sheet is a summary. It may not cover all possible information. If you have questions about this medicine, talk to your doctor, pharmacist, or health care provider.  2020 Elsevier/Gold Standard (2019-03-11 15:10:10)  

## 2019-12-22 NOTE — Progress Notes (Signed)
Tremfya injected SQ into the R lower abdomen. Patient tolerated well.   [id number] Exp: 10/2020

## 2020-02-15 ENCOUNTER — Ambulatory Visit: Payer: Medicare PPO | Admitting: Dermatology

## 2020-02-16 ENCOUNTER — Ambulatory Visit (INDEPENDENT_AMBULATORY_CARE_PROVIDER_SITE_OTHER): Payer: Medicare PPO

## 2020-02-16 ENCOUNTER — Other Ambulatory Visit: Payer: Self-pay

## 2020-02-16 DIAGNOSIS — L400 Psoriasis vulgaris: Secondary | ICD-10-CM | POA: Diagnosis not present

## 2020-02-16 MED ORDER — GUSELKUMAB 100 MG/ML ~~LOC~~ SOSY
100.0000 mg | PREFILLED_SYRINGE | Freq: Once | SUBCUTANEOUS | Status: AC
  Administered 2020-02-16: 100 mg via SUBCUTANEOUS

## 2020-02-16 NOTE — Patient Instructions (Signed)
Guselkumab injection What is this medicine? GUSELKUMAB (goo ZELK ue mab) is used to treat plaque psoriasis and psoriatic arthritis. This medicine may be used for other purposes; ask your health care provider or pharmacist if you have questions. COMMON BRAND NAME(S): Tremfya What should I tell my health care provider before I take this medicine? They need to know if you have any of these conditions:  immune system problems  infection (especially a virus infection such as chickenpox, cold sores, or herpes) or history of infections  recently received or scheduled to receive a vaccine  tuberculosis, a positive skin test for tuberculosis, or have recently been in close contact with someone who has tuberculosis  an unusual or allergic reaction to guselkumab, other medicines, foods, dyes, or preservatives  pregnant or trying to get pregnant  breast-feeding How should I use this medicine? This medicine is for injection under the skin. It may be administered by a healthcare professional in a hospital or clinic setting or at home. If you get this medicine at home, you will be taught how to prepare and give this medicine. Use exactly as directed. Take your medicine at regular intervals. Do not take your medicine more often than directed. It is important that you put your used injectors, needles and syringes in a special sharps container. Do not put them in a trash can. If you do not have a sharps container, call your pharmacist or healthcare provider to get one. A special MedGuide will be given to you by the pharmacist with each prescription and refill. Be sure to read this information carefully each time. Talk to your pediatrician regarding the use of this medicine in children. Special care may be needed. Overdosage: If you think you have taken too much of this medicine contact a poison control center or emergency room at once. NOTE: This medicine is only for you. Do not share this medicine with  others. What if I miss a dose? It is important not to miss your dose. Call your doctor of health care professional if you are unable to keep an appointment. If you give yourself the medicine and you miss a dose, take it as soon as you can. Then, take your next dose at your regular scheduled time. What may interact with this medicine? Do not take this medicine with any of the following medications:  live virus vaccines This medicine may also interact with the following medications:  amoxapine  certain medicines for depression, anxiety, or psychotic disturbances like amitriptyline, clomipramine, desipramine, doxepin, imipramine, maprotiline, nortriptyline, protriptyline, trimipramine  codeine  inactivated vaccines  methadone  pimozide  thioridazine This list may not describe all possible interactions. Give your health care provider a list of all the medicines, herbs, non-prescription drugs, or dietary supplements you use. Also tell them if you smoke, drink alcohol, or use illegal drugs. Some items may interact with your medicine. What should I watch for while using this medicine? Your condition will be monitored carefully while you are receiving this medicine. Tell your doctor or healthcare professional if your symptoms do not start to get better or if they get worse. You will be tested for tuberculosis (TB) before you start this medicine. If your doctor prescribes any medicine for TB, you should start taking the TB medicine before starting this medicine. Make sure to finish the full course of TB medicine. Call your doctor or health care professional if you get a cold or other infection while receiving this medicine. Do not treat yourself. This   medicine may decrease your body's ability to fight infection. What side effects may I notice from receiving this medicine? Side effects that you should report to your doctor or health care professional as soon as possible:  allergic reactions like  skin rash, itching or hives, swelling of the face, lips, or tongue  breathing problems  chest pain or chest tightness  dizziness, feeling faint or lightheaded  signs and symptoms of infection like fever or chills; cough; sore throat; pain or trouble passing urine Side effects that usually do not require medical attention (report these to your doctor or health care professional if they continue or are bothersome):  diarrhea  headache  joint pain  pain, redness, irritation at site where injected This list may not describe all possible side effects. Call your doctor for medical advice about side effects. You may report side effects to FDA at 1-800-FDA-1088. Where should I keep my medicine? Keep out of the reach of children. Store unopened syringes or injectors in a refrigerator between 2 to 8 degrees C (36 to 46 degrees F). Keep in the original container until ready for use. Protect from light. Do not freeze. Do not shake. Prior to use, remove the syringe or injector from the refrigerator and use after 30 minutes at room temperature. Throw away any unused medicine after the expiration date on the label. NOTE: This sheet is a summary. It may not cover all possible information. If you have questions about this medicine, talk to your doctor, pharmacist, or health care provider.  2020 Elsevier/Gold Standard (2019-03-11 15:10:10)  

## 2020-02-16 NOTE — Progress Notes (Signed)
Patient here for 8 week Tremfya injection. She was suppose to see Dr. Roseanne Reno yesterday but had to cancel due to conflict with another appointment for spouse. Patient has rescheduled with Dr. Roseanne Reno for her next injection in 8 weeks.  Tremfya 100mg /mL injected into the L lower abdomen. Patient tolerated injection well.  LOT: KESOY.AR EXP: 11/2020

## 2020-04-12 ENCOUNTER — Ambulatory Visit: Payer: Medicare PPO | Admitting: Dermatology

## 2020-04-12 ENCOUNTER — Other Ambulatory Visit: Payer: Self-pay

## 2020-04-12 DIAGNOSIS — Z79899 Other long term (current) drug therapy: Secondary | ICD-10-CM | POA: Diagnosis not present

## 2020-04-12 DIAGNOSIS — L409 Psoriasis, unspecified: Secondary | ICD-10-CM

## 2020-04-12 MED ORDER — KETOCONAZOLE 2 % EX CREA: TOPICAL_CREAM | CUTANEOUS | 3 refills | Status: DC

## 2020-04-12 MED ORDER — HYDROCORTISONE 2.5 % EX CREA: TOPICAL_CREAM | CUTANEOUS | 2 refills | Status: DC

## 2020-04-12 MED ORDER — GUSELKUMAB 100 MG/ML ~~LOC~~ SOSY
100.0000 mg | PREFILLED_SYRINGE | Freq: Once | SUBCUTANEOUS | Status: AC
  Administered 2020-04-12: 100 mg via SUBCUTANEOUS

## 2020-04-12 NOTE — Progress Notes (Signed)
   Follow-Up Visit   Subjective  Morgan Mcclain Or Morgan Mcclain is a 69 y.o. female who presents for the following: Psoriasis (inframammary, groin. Controlled on Tremfya. Patient flared a little last week due to the hot weather. She has been using ketoconazole 2% cream to inframammary.). Still itching some.  No side effects from [facility]  The following portions of the chart were reviewed this encounter and updated as appropriate:      Review of Systems:  No other skin or systemic complaints except as noted in HPI or Assessment and Plan.  Objective  Well appearing patient in no apparent distress; mood and affect are within normal limits.  A focused examination was performed including inframmary. Relevant physical exam findings are noted in the Assessment and Plan.  Objective  Inframammary, groin: Hyperpigmented scaly patch on right inframammary area/upper abdomen.   Assessment & Plan  Psoriasis Inframammary, groin  Well-controlled Start hydrocortisone 2.5% cream Apply to AAs qd/bid prn itch. Continue ketoconazole 2% cream qd/bid prn flares. Continue Tremfya 100mg /mL sq q 8 weeks.  Tremfya 100mg /mL injected to right lower abdomen today in office. Patient tolerated well.   ketoconazole (NIZORAL) 2 % cream - Inframammary, groin  hydrocortisone 2.5 % cream - Inframammary, groin  Other Related Procedures Comprehensive metabolic panel CBC with Differential/Platelet QuantiFERON-TB Gold Plus Long term medication management.  Return in about 8 weeks (around 06/07/2020) for nurse visit for Tremfya injection.  Documentation: I have reviewed the above documentation for accuracy and completeness, and I agree with the above.  MD

## 2020-05-24 ENCOUNTER — Other Ambulatory Visit: Payer: Self-pay

## 2020-05-24 MED ORDER — TREMFYA 100 MG/ML ~~LOC~~ SOSY: PREFILLED_SYRINGE | SUBCUTANEOUS | 3 refills | Status: DC

## 2020-05-24 NOTE — Progress Notes (Signed)
Rx refill

## 2020-06-07 ENCOUNTER — Ambulatory Visit (INDEPENDENT_AMBULATORY_CARE_PROVIDER_SITE_OTHER): Payer: Medicare PPO

## 2020-06-07 ENCOUNTER — Other Ambulatory Visit: Payer: Self-pay

## 2020-06-07 DIAGNOSIS — L409 Psoriasis, unspecified: Secondary | ICD-10-CM | POA: Diagnosis not present

## 2020-06-07 MED ORDER — GUSELKUMAB 100 MG/ML ~~LOC~~ SOSY
100.0000 mg | PREFILLED_SYRINGE | Freq: Once | SUBCUTANEOUS | Status: AC
  Administered 2020-06-07: 100 mg via SUBCUTANEOUS

## 2020-06-07 NOTE — Progress Notes (Signed)
Pt here for 8 week Tremfya injection. Tremfya 100 mg/mL injected sq into left lower abdomen. Patient tolerated well.   Lot: [id number] Exp: 07/2021

## 2020-06-20 DIAGNOSIS — L409 Psoriasis, unspecified: Secondary | ICD-10-CM | POA: Diagnosis not present

## 2020-06-23 LAB — QUANTIFERON-TB GOLD PLUS
QuantiFERON Mitogen Value: >10 IU/mL
QuantiFERON Nil Value: 0 IU/mL
QuantiFERON TB1 Ag Value: 0 IU/mL
QuantiFERON TB2 Ag Value: 0 IU/mL
QuantiFERON-TB Gold Plus: NEGATIVE

## 2020-06-23 LAB — CBC WITH DIFFERENTIAL/PLATELET
Basophils Absolute: 0 10*3/uL (ref 0.0–0.2)
Basos: 1 %
EOS (ABSOLUTE): 0.1 10*3/uL (ref 0.0–0.4)
Eos: 3 %
Hematocrit: 42.6 % (ref 34.0–46.6)
Hemoglobin: 13.9 g/dL (ref 11.1–15.9)
Immature Grans (Abs): 0 10*3/uL (ref 0.0–0.1)
Immature Granulocytes: 0 %
Lymphocytes Absolute: 1.3 10*3/uL (ref 0.7–3.1)
Lymphs: 29 %
MCH: 31.1 pg (ref 26.6–33.0)
MCHC: 32.6 g/dL (ref 31.5–35.7)
MCV: 95 fL (ref 79–97)
Monocytes Absolute: 0.4 10*3/uL (ref 0.1–0.9)
Monocytes: 9 %
Neutrophils Absolute: 2.7 10*3/uL (ref 1.4–7.0)
Neutrophils: 58 %
Platelets: 186 10*3/uL (ref 150–450)
RBC: 4.47 x10E6/uL (ref 3.77–5.28)
RDW: 12 % (ref 11.7–15.4)
WBC: 4.6 10*3/uL (ref 3.4–10.8)

## 2020-06-23 LAB — COMPREHENSIVE METABOLIC PANEL
ALT: 7 IU/L (ref 0–32)
AST: 15 IU/L (ref 0–40)
Albumin/Globulin Ratio: 1.2 (ref 1.2–2.2)
Albumin: 4.1 g/dL (ref 3.8–4.8)
Alkaline Phosphatase: 113 IU/L (ref 44–121)
BUN/Creatinine Ratio: 17 (ref 12–28)
BUN: 13 mg/dL (ref 8–27)
Bilirubin Total: 0.2 mg/dL (ref 0.0–1.2)
CO2: 23 mmol/L (ref 20–29)
Calcium: 9.3 mg/dL (ref 8.7–10.3)
Chloride: 104 mmol/L (ref 96–106)
Creatinine, Ser: 0.75 mg/dL (ref 0.57–1.00)
GFR calc Af Amer: 94 mL/min/{1.73_m2} (ref >59)
GFR calc non Af Amer: 82 mL/min/{1.73_m2} (ref >59)
Globulin, Total: 3.3 g/dL (ref 1.5–4.5)
Glucose: 81 mg/dL (ref 65–99)
Potassium: 4.4 mmol/L (ref 3.5–5.2)
Sodium: 141 mmol/L (ref 134–144)
Total Protein: 7.4 g/dL (ref 6.0–8.5)

## 2020-06-27 ENCOUNTER — Telehealth: Payer: Self-pay

## 2020-06-27 NOTE — Telephone Encounter (Signed)
-----   Message from Willeen Niece, MD sent at 06/27/2020  8:59 AM EDT ----- Labs normal, TB negative, continue Tremfya

## 2020-06-27 NOTE — Telephone Encounter (Signed)
Advised patient labs normal, TB negative, and ok to continue Tremfya.

## 2020-07-28 ENCOUNTER — Ambulatory Visit (INDEPENDENT_AMBULATORY_CARE_PROVIDER_SITE_OTHER): Payer: Medicare PPO

## 2020-07-28 ENCOUNTER — Telehealth: Payer: Self-pay

## 2020-07-28 ENCOUNTER — Other Ambulatory Visit: Payer: Self-pay

## 2020-07-28 DIAGNOSIS — L400 Psoriasis vulgaris: Secondary | ICD-10-CM | POA: Diagnosis not present

## 2020-07-28 MED ORDER — GUSELKUMAB 100 MG/ML ~~LOC~~ SOSY
100.0000 mg | PREFILLED_SYRINGE | Freq: Once | SUBCUTANEOUS | Status: AC
  Administered 2020-07-28: 100 mg via SUBCUTANEOUS

## 2020-07-28 NOTE — Telephone Encounter (Signed)
Patient is having a small flare of psoriasis on her nose. Patient has Ketoconazole Cream and Hydrocortisone Cream 2.5% at home. What can she use there? Do we need to send something new in?

## 2020-07-28 NOTE — Progress Notes (Signed)
Patient here for 8 week Tremfya injection.   Tremfya 100mg /mL injected SQ into right lower abdomen. Patient tolerated well.   LOT: [id number] EXP: 10/2021

## 2020-07-31 NOTE — Telephone Encounter (Signed)
She can use both those creams on her nose twice daily as needed until improved

## 2020-08-01 ENCOUNTER — Other Ambulatory Visit: Payer: Self-pay | Admitting: *Deleted

## 2020-08-01 DIAGNOSIS — Z0000 Encounter for general adult medical examination without abnormal findings: Secondary | ICD-10-CM

## 2020-08-01 DIAGNOSIS — E559 Vitamin D deficiency, unspecified: Secondary | ICD-10-CM

## 2020-08-01 DIAGNOSIS — R030 Elevated blood-pressure reading, without diagnosis of hypertension: Secondary | ICD-10-CM

## 2020-08-01 DIAGNOSIS — Z6841 Body Mass Index (BMI) 40.0 and over, adult: Secondary | ICD-10-CM

## 2020-08-01 DIAGNOSIS — E538 Deficiency of other specified B group vitamins: Secondary | ICD-10-CM

## 2020-08-01 DIAGNOSIS — E782 Mixed hyperlipidemia: Secondary | ICD-10-CM

## 2020-08-01 DIAGNOSIS — L4050 Arthropathic psoriasis, unspecified: Secondary | ICD-10-CM

## 2020-08-01 DIAGNOSIS — R7303 Prediabetes: Secondary | ICD-10-CM

## 2020-08-02 ENCOUNTER — Ambulatory Visit: Payer: Medicare PPO

## 2020-08-02 ENCOUNTER — Other Ambulatory Visit: Payer: Medicare PPO

## 2020-08-02 NOTE — Telephone Encounter (Signed)
Patient advised of information per Dr. Stewart. 

## 2020-08-05 DIAGNOSIS — R030 Elevated blood-pressure reading, without diagnosis of hypertension: Secondary | ICD-10-CM | POA: Diagnosis not present

## 2020-08-05 DIAGNOSIS — E538 Deficiency of other specified B group vitamins: Secondary | ICD-10-CM | POA: Diagnosis not present

## 2020-08-05 DIAGNOSIS — R7303 Prediabetes: Secondary | ICD-10-CM | POA: Diagnosis not present

## 2020-08-05 DIAGNOSIS — E782 Mixed hyperlipidemia: Secondary | ICD-10-CM | POA: Diagnosis not present

## 2020-08-05 DIAGNOSIS — E559 Vitamin D deficiency, unspecified: Secondary | ICD-10-CM | POA: Diagnosis not present

## 2020-08-05 DIAGNOSIS — Z0000 Encounter for general adult medical examination without abnormal findings: Secondary | ICD-10-CM | POA: Diagnosis not present

## 2020-08-05 DIAGNOSIS — L4050 Arthropathic psoriasis, unspecified: Secondary | ICD-10-CM | POA: Diagnosis not present

## 2020-08-05 DIAGNOSIS — Z6841 Body Mass Index (BMI) 40.0 and over, adult: Secondary | ICD-10-CM | POA: Diagnosis not present

## 2020-08-06 LAB — COMPLETE METABOLIC PANEL WITH GFR
AG Ratio: 1.2 (calc) (ref 1.0–2.5)
ALT: 9 U/L (ref 6–29)
AST: 16 U/L (ref 10–35)
Albumin: 4.1 g/dL (ref 3.6–5.1)
Alkaline phosphatase (APISO): 96 U/L (ref 37–153)
BUN: 12 mg/dL (ref 7–25)
CO2: 28 mmol/L (ref 20–32)
Calcium: 9.4 mg/dL (ref 8.6–10.4)
Chloride: 104 mmol/L (ref 98–110)
Creat: 0.71 mg/dL (ref 0.50–0.99)
GFR, Est African American: 101 mL/min/{1.73_m2} (ref >=60)
GFR, Est Non African American: 87 mL/min/{1.73_m2} (ref >=60)
Globulin: 3.3 g/dL (calc) (ref 1.9–3.7)
Glucose, Bld: 77 mg/dL (ref 65–99)
Potassium: 3.9 mmol/L (ref 3.5–5.3)
Sodium: 139 mmol/L (ref 135–146)
Total Bilirubin: 0.5 mg/dL (ref 0.2–1.2)
Total Protein: 7.4 g/dL (ref 6.1–8.1)

## 2020-08-06 LAB — CBC WITH DIFFERENTIAL/PLATELET
Absolute Monocytes: 561 cells/uL (ref 200–950)
Basophils Absolute: 41 cells/uL (ref 0–200)
Basophils Relative: 0.9 %
Eosinophils Absolute: 101 cells/uL (ref 15–500)
Eosinophils Relative: 2.2 %
HCT: 41.3 % (ref 35.0–45.0)
Hemoglobin: 13.9 g/dL (ref 11.7–15.5)
Lymphs Abs: 1224 cells/uL (ref 850–3900)
MCH: 32 pg (ref 27.0–33.0)
MCHC: 33.7 g/dL (ref 32.0–36.0)
MCV: 95.2 fL (ref 80.0–100.0)
MPV: 11.2 fL (ref 7.5–12.5)
Monocytes Relative: 12.2 %
Neutro Abs: 2673 cells/uL (ref 1500–7800)
Neutrophils Relative %: 58.1 %
Platelets: 205 10*3/uL (ref 140–400)
RBC: 4.34 10*6/uL (ref 3.80–5.10)
RDW: 11.9 % (ref 11.0–15.0)
Total Lymphocyte: 26.6 %
WBC: 4.6 10*3/uL (ref 3.8–10.8)

## 2020-08-06 LAB — LIPID PANEL
Cholesterol: 178 mg/dL (ref <200)
HDL: 47 mg/dL — ABNORMAL LOW (ref >=50)
LDL Cholesterol (Calc): 116 mg/dL (calc) — ABNORMAL HIGH
Non-HDL Cholesterol (Calc): 131 mg/dL (calc) — ABNORMAL HIGH (ref <130)
Total CHOL/HDL Ratio: 3.8 (calc) (ref <5.0)
Triglycerides: 63 mg/dL (ref <150)

## 2020-08-06 LAB — TSH: TSH: 1.01 mIU/L (ref 0.40–4.50)

## 2020-08-06 LAB — HEMOGLOBIN A1C
Hgb A1c MFr Bld: 5.9 % of total Hgb — ABNORMAL HIGH (ref <5.7)
Mean Plasma Glucose: 123 mg/dL
eAG (mmol/L): 6.8 mmol/L

## 2020-08-06 LAB — VITAMIN D 25 HYDROXY (VIT D DEFICIENCY, FRACTURES): Vit D, 25-Hydroxy: 33 ng/mL (ref 30–100)

## 2020-08-06 LAB — VITAMIN B12: Vitamin B-12: 566 pg/mL (ref 200–1100)

## 2020-08-09 ENCOUNTER — Ambulatory Visit (INDEPENDENT_AMBULATORY_CARE_PROVIDER_SITE_OTHER): Payer: Medicare PPO | Admitting: Family Medicine

## 2020-08-09 ENCOUNTER — Other Ambulatory Visit: Payer: Self-pay

## 2020-08-09 ENCOUNTER — Encounter: Payer: Self-pay | Admitting: Family Medicine

## 2020-08-09 VITALS — BP 138/80 | HR 58 | Temp 97.3°F | Resp 16 | Ht 66.0 in | Wt 244.0 lb

## 2020-08-09 DIAGNOSIS — L4050 Arthropathic psoriasis, unspecified: Secondary | ICD-10-CM

## 2020-08-09 DIAGNOSIS — R7303 Prediabetes: Secondary | ICD-10-CM

## 2020-08-09 DIAGNOSIS — Z0000 Encounter for general adult medical examination without abnormal findings: Secondary | ICD-10-CM

## 2020-08-09 DIAGNOSIS — M170 Bilateral primary osteoarthritis of knee: Secondary | ICD-10-CM

## 2020-08-09 DIAGNOSIS — E782 Mixed hyperlipidemia: Secondary | ICD-10-CM

## 2020-08-09 DIAGNOSIS — R030 Elevated blood-pressure reading, without diagnosis of hypertension: Secondary | ICD-10-CM

## 2020-08-09 DIAGNOSIS — Z6841 Body Mass Index (BMI) 40.0 and over, adult: Secondary | ICD-10-CM

## 2020-08-09 DIAGNOSIS — E559 Vitamin D deficiency, unspecified: Secondary | ICD-10-CM | POA: Diagnosis not present

## 2020-08-09 MED ORDER — ROSUVASTATIN CALCIUM 10 MG PO TABS: 10.0000 mg | ORAL_TABLET | Freq: Every day | ORAL | 3 refills | Status: DC

## 2020-08-09 NOTE — Assessment & Plan Note (Signed)
Counseling on weight loss lifestyle diet exercise Discuss options in future for wt loss medication - GLP1 vs Contrave Can consider more aggressive diet regimen like Keto as well Future referral to wt loss management center if indicated [facility]

## 2020-08-09 NOTE — Assessment & Plan Note (Signed)
Stable, elevated A1c to 5.9 with Pre-DM - encouraging maintain A1c Concern with obesity, HTN, HLD  Plan:  1. Not on any therapy currently  2. Encourage improved lifestyle - low carb, low sugar diet, reduce portion size, continue improving regular exercise 3. Follow-up q 6 mo lab

## 2020-08-09 NOTE — Progress Notes (Signed)
Subjective:    Patient ID: Morgan Mcclain, female    DOB: 1950/08/31, 69 y.o.   MRN: [id number]  Morgan Mcclain is a 69 y.o. female presenting on 08/09/2020 for Annual Exam   HPI   Here for Annual Physical and Lab Review.  Pre-HTN No recent home BP readings, she has previously done Home BP readings have been normal. Not taking HCTZ regularly - has discontinued this med. Controlled edema LE on compression and diuretic PRN Denies CP, dyspnea, HA, dizziness / lightheadedness  Pre-Diabetes /HYPERLIPIDEMIA/ MORBID OBESITY BMI >39 Last result A1c still at 5.7, similar to prior results in mild Pre-DM Last lab lipid LDL down from 140 to 103 and TC 200 to 163 on statin now. Tolerating well, taking Rosuvastatin 10mg  daily Lifestyle - Weight up +6 lbs in >6-9 months - Diet:Still improving diet, reduced portion size not always adhering - Exercise:goal to start moreregular walking and exercisestill Denies hypoglycemia  Vitamin D Deficiency Vitamin B12 Deficiency Currently doing well on both vitamins, last lab Vitamin D 33, and Last B12 was 566, both in normal range. Taking supplements for both.  Psoriatic Arthritis / OA/DJD Followed by Dermatology on medication management.  Additional history - R ankle sprain or twist while back.    Health Maintenance: UTD Flu vaccine, 06/2020  UTD Colonoscopy 2016  UTD Mammogram screening, abnormal, ultimately had bilateral breast biopsy, negative in 2020.  UTD COVID19 vaccine 2021. She received booster.   Depression screen [facility] 2/9 08/09/2020 11/09/2019 09/01/2019  Decreased Interest 0 0 0  Down, Depressed, Hopeless 0 0 0  PHQ - 2 Score 0 0 0  Altered sleeping - - -  Tired, decreased energy - - -  Change in appetite - - -  Feeling bad or failure about yourself  - - -  Trouble concentrating - - -  Moving slowly or fidgety/restless - - -  Suicidal thoughts - - -  PHQ-9 Score - - -  Difficult doing work/chores - - -     Past Medical History:  Diagnosis Date  . Arthritis   . Left ureteral calculus   . Psoriasis SEVERE--  RECEIVES LASE TX'S TWICE WEEKLY   Tremfya  . Renal calculus, left   . Sleep apnea 02/23/2015   No longer on CPAP   Past Surgical History:  Procedure Laterality Date  . ABDOMINAL HYSTERECTOMY  1994   W/ UNILATERAL SALPINGOOPHORECTOMY  . BREAST BIOPSY Right 07/14/2019   Affirm Bx- Coil clip-Path pending  . BREAST BIOPSY Left 07/14/2019   Affirm Bx- X Clip- path pending  . COLONOSCOPY WITH PROPOFOL N/A 03/04/2015   Procedure: COLONOSCOPY WITH PROPOFOL;  Surgeon: 05/05/2015, MD;  Location[facility];  Service: Endoscopy;  Laterality: N/A;  . CYSTOSCOPY WITH RETROGRADE PYELOGRAM, URETEROSCOPY AND STENT PLACEMENT Left 12/25/2012   Procedure: CYSTOSCOPY WITH RETROGRADE PYELOGRAM, URETEROSCOPY AND STENT PLACEMENT;  Surgeon: 02/24/2013, MD;  Location: [facility];  Service: Urology;  Laterality: Left;  90 MIN NEEDS DIG URETEROSCOPE   . HOLMIUM LASER APPLICATION Left 12/25/2012   Procedure: HOLMIUM LASER APPLICATION;  Surgeon: 02/24/2013, MD;  Location: [facility];  Service: Urology;  Laterality: Left;  . TONSILLECTOMY  AGE 21   Social History   Socioeconomic History  . Marital status: Married    Spouse name: Not on file  . Number of children: 2  . Years of education: Administrative Degree 6 yr  . Highest education level: Master's degree (e.g., MA, MS, MEng, MEd,  MSW, MBA)  Occupational History  . Occupation: Retired Geologist, engineering  Tobacco Use  . Smoking status: Never Smoker  . Smokeless tobacco: Never Used  Vaping Use  . Vaping Use: Never used  Substance and Sexual Activity  . Alcohol use: No  . Drug use: No  . Sexual activity: Not on file  Other Topics Concern  . Not on file  Social History Narrative  . Not on file   Social Determinants of Health   Financial Resource Strain: Not on file  Food Insecurity: Not on file   Transportation Needs: Not on file  Physical Activity: Not on file  Stress: Not on file  Social Connections: Not on file  Intimate Partner Violence: Not on file   Family History  Problem Relation Age of Onset  . Heart failure Mother   . Arthritis Mother   . Heart attack Mother 61       Secondary CVA  . Cancer Father        lung cancer  . Heart disease Sister 62  . Breast cancer Neg Hx    Current Outpatient Medications on File Prior to Visit  Medication Sig  . Cholecalciferol (VITAMIN D3) 50 MCG (2000 UT) TABS Take 2,000 Units by mouth daily.  . hydrocortisone 2.5 % cream Apply to affected areas 1-2 times daily as needed for itch.  . ketoconazole (NIZORAL) 2 % cream Apply 1-2 times daily to body folds prn flares.  Marland Kitchen TREMFYA 100 MG/ML SOSY Inject 100 mg subcutaneously every 8 weeks as directed.  . vitamin B-12 (CYANOCOBALAMIN) 1000 MCG tablet Take 1,000 mcg by mouth daily.   No current facility-administered medications on file prior to visit.    Review of Systems  Constitutional: Negative for activity change, appetite change, chills, diaphoresis, fatigue and fever.  HENT: Negative for congestion and hearing loss.   Eyes: Negative for visual disturbance.  Respiratory: Negative for cough, chest tightness, shortness of breath and wheezing.   Cardiovascular: Negative for chest pain, palpitations and leg swelling.  Gastrointestinal: Negative for abdominal pain, constipation, diarrhea, nausea and vomiting.  Endocrine: Negative for cold intolerance.  Genitourinary: Negative for difficulty urinating, dysuria, frequency and hematuria.  Musculoskeletal: Negative for arthralgias and neck pain.  Skin: Negative for rash.  Allergic/Immunologic: Negative for environmental allergies.  Neurological: Negative for dizziness, weakness, light-headedness, numbness and headaches.  Hematological: Negative for adenopathy.  Psychiatric/Behavioral: Negative for behavioral problems, dysphoric mood and  sleep disturbance.   Per HPI unless specifically indicated above      Objective:    BP 138/80 (BP Location: Left Arm, Cuff Size: Normal)   Pulse (!) 58   Temp (!) 97.3 F (36.3 C) (Temporal)   Resp 16   Ht 5\' 6"  (1.676 m)   Wt 244 lb (110.7 kg)   SpO2 100%   BMI 39.38 kg/m   Wt Readings from Last 3 Encounters:  08/09/20 244 lb (110.7 kg)  11/09/19 238 lb (108 kg)  09/01/19 234 lb (106.1 kg)    Physical Exam Vitals and nursing note reviewed.  Constitutional:      General: She is not in acute distress.    Appearance: She is well-developed and well-nourished. She is obese. She is not diaphoretic.     Comments: Well-appearing, comfortable, cooperative  HENT:     Head: Normocephalic and atraumatic.     Mouth/Throat:     Mouth: Oropharynx is clear and moist.  Eyes:     General:        Right  eye: No discharge.        Left eye: No discharge.     Extraocular Movements: EOM normal.     Conjunctiva/sclera: Conjunctivae normal.     Pupils: Pupils are equal, round, and reactive to light.  Neck:     Thyroid: No thyromegaly.  Cardiovascular:     Rate and Rhythm: Normal rate and regular rhythm.     Pulses: Intact distal pulses.     Heart sounds: Normal heart sounds. No murmur heard.   Pulmonary:     Effort: Pulmonary effort is normal. No respiratory distress.     Breath sounds: Normal breath sounds. No wheezing or rales.  Abdominal:     General: Bowel sounds are normal. There is no distension.     Palpations: Abdomen is soft. There is no mass.     Tenderness: There is no abdominal tenderness.  Musculoskeletal:        General: No tenderness or edema. Normal range of motion.     Cervical back: Normal range of motion and neck supple.     Right lower leg: No edema.     Left lower leg: No edema.     Comments: Upper / Lower Extremities: - Normal muscle tone, strength bilateral upper extremities 5/5, lower extremities 5/5  Lymphadenopathy:     Cervical: No cervical adenopathy.   Skin:    General: Skin is warm and dry.     Findings: No erythema or rash.  Neurological:     Mental Status: She is alert and oriented to person, place, and time.     Comments: Distal sensation intact to light touch all extremities  Psychiatric:        Mood and Affect: Mood and affect normal.        Behavior: Behavior normal.     Comments: Well groomed, good eye contact, normal speech and thoughts        Results for orders placed or performed in visit on 08/01/20  VITAMIN D 25 Hydroxy (Vit-D Deficiency, Fractures)  Result Value Ref Range   Vit D, 25-Hydroxy 33 30 - 100 ng/mL  Vitamin B12  Result Value Ref Range   Vitamin B-12 566 200 - 1,100 pg/mL  TSH  Result Value Ref Range   TSH 1.01 0.40 - 4.50 mIU/L  Lipid panel  Result Value Ref Range   Cholesterol 178 <200 mg/dL   HDL 47 (L) > OR = 50 mg/dL   Triglycerides 63 <161 mg/dL   LDL Cholesterol (Calc) 116 (H) mg/dL (calc)   Total CHOL/HDL Ratio 3.8 <5.0 (calc)   Non-HDL Cholesterol (Calc) 131 (H) <130 mg/dL (calc)  COMPLETE METABOLIC PANEL WITH GFR  Result Value Ref Range   Glucose, Bld 77 65 - 99 mg/dL   BUN 12 7 - 25 mg/dL   Creat 0.96 0.45 - 4.09 mg/dL   GFR, Est Non African American 87 > OR = 60 mL/min/1.65m2   GFR, Est African American 101 > OR = 60 mL/min/1.75m2   BUN/Creatinine Ratio NOT APPLICABLE 6 - 22 (calc)   Sodium 139 135 - 146 mmol/L   Potassium 3.9 3.5 - 5.3 mmol/L   Chloride 104 98 - 110 mmol/L   CO2 28 20 - 32 mmol/L   Calcium 9.4 8.6 - 10.4 mg/dL   Total Protein 7.4 6.1 - 8.1 g/dL   Albumin 4.1 3.6 - 5.1 g/dL   Globulin 3.3 1.9 - 3.7 g/dL (calc)   AG Ratio 1.2 1.0 - 2.5 (calc)   Total  Bilirubin 0.5 0.2 - 1.2 mg/dL   Alkaline phosphatase (APISO) 96 37 - 153 U/L   AST 16 10 - 35 U/L   ALT 9 6 - 29 U/L  CBC with Differential/Platelet  Result Value Ref Range   WBC 4.6 3.8 - 10.8 Thousand/uL   RBC 4.34 3.80 - 5.10 Million/uL   Hemoglobin 13.9 11.7 - 15.5 g/dL   HCT 96.041.3 45.435.0 - 09.845.0 %   MCV  95.2 80.0 - 100.0 fL   MCH 32.0 27.0 - 33.0 pg   MCHC 33.7 32.0 - 36.0 g/dL   RDW 11.911.9 14.711.0 - 82.915.0 %   Platelets 205 140 - 400 Thousand/uL   MPV 11.2 7.5 - 12.5 fL   Neutro Abs 2,673 1,500 - 7,800 cells/uL   Lymphs Abs 1,224 850 - 3,900 cells/uL   Absolute Monocytes 561 200 - 950 cells/uL   Eosinophils Absolute 101 15 - 500 cells/uL   Basophils Absolute 41 0 - 200 cells/uL   Neutrophils Relative % 58.1 %   Total Lymphocyte 26.6 %   Monocytes Relative 12.2 %   Eosinophils Relative 2.2 %   Basophils Relative 0.9 %  Hemoglobin A1c  Result Value Ref Range   Hgb A1c MFr Bld 5.9 (H) <5.7 % of total Hgb   Mean Plasma Glucose 123 mg/dL   eAG (mmol/L) 6.8 mmol/L      Assessment & Plan:   Problem List Items Addressed This Visit    Vitamin D deficiency   Psoriasis with arthropathy (HCC)    Followed by Dermatology/Rheumatology On biologic agent currently      Pre-hypertension    Repeat BP improved to normal Limited home readings, previously recorded Reassurance, lifestyle management      Pre-diabetes    Stable, elevated A1c to 5.9 with Pre-DM - encouraging maintain A1c Concern with obesity, HTN, HLD  Plan:  1. Not on any therapy currently  2. Encourage improved lifestyle - low carb, low sugar diet, reduce portion size, continue improving regular exercise 3. Follow-up q 6 mo lab      Morbid obesity with BMI of 40.0-44.9, adult (HCC)    Counseling on weight loss lifestyle diet exercise Discuss options in future for wt loss medication - GLP1 vs Contrave Can consider more aggressive diet regimen like Keto as well Future referral to wt loss management center if indicated [place]       Hyperlipidemia    Slightly elevated but still improved cholesterol panel Last lipid 07/2020 The 10-year ASCVD risk score Denman George(Goff DC Jr., et al., 2013) is: 11.3%  Plan: CONTINUE Rosuvastatin 10mg  nightly - goal to improve adherence Encourage improved lifestyle - low carb/cholesterol, reduce  portion size, continue improving regular exercise      Relevant Medications   rosuvastatin (CRESTOR) 10 MG tablet   Degenerative arthritis of knee, bilateral    Stable chronic problem Mixed OA/DJD Psoriatic Arthritis Improved Off Meloxicam NSAID and Baclofen now       Other Visit Diagnoses    Annual physical exam    -  Primary      Updated Health Maintenance information Reviewed recent lab results with patient Encouraged improvement to lifestyle with diet and exercise - Goal of weight loss   Meds ordered this encounter  Medications  . rosuvastatin (CRESTOR) 10 MG tablet    Sig: Take 1 tablet (10 mg total) by mouth at bedtime.    Dispense:  90 tablet    Refill:  3      Follow up  plan: Return in about 6 months (around 02/07/2021) for 6 month PreDM A1c, PreHTN, HLD, Weight Loss.  Saralyn Pilar, DO [facility] 08/09/2020, 9:42 AM

## 2020-08-09 NOTE — Assessment & Plan Note (Signed)
Followed by Dermatology/Rheumatology On biologic agent currently 

## 2020-08-09 NOTE — Assessment & Plan Note (Signed)
Slightly elevated but still improved cholesterol panel Last lipid 07/2020 The 10-year ASCVD risk score Denman George DC Jr., et al., 2013) is: 11.3%  Plan: CONTINUE Rosuvastatin 10mg  nightly - goal to improve adherence Encourage improved lifestyle - low carb/cholesterol, reduce portion size, continue improving regular exercise

## 2020-08-09 NOTE — Assessment & Plan Note (Signed)
Repeat BP improved to normal Limited home readings, previously recorded Reassurance, lifestyle management

## 2020-08-09 NOTE — Assessment & Plan Note (Signed)
Stable chronic problem Mixed OA/DJD Psoriatic Arthritis Improved Off Meloxicam NSAID and Baclofen now

## 2020-08-09 NOTE — Patient Instructions (Addendum)
Thank you for coming to the office today.  [facility] Address: [address] [place], [place] [address] Hours: Open 8AM-5PM Phone: [phone]  [facility] [address] [place], [place] [address] Hours - M-F 8-5 Phone: [phone] phone  -----------------------------------------------------------  Consider keto diet if interested.  We can consider weight loss meds such as   Saxenda - injection OPFYTW - injection Contrave - pill   Please schedule a Follow-up Appointment to: Return in about 6 months (around 02/07/2021) for 6 month PreDM A1c, PreHTN, HLD, Weight Loss.  If you have any other questions or concerns, please feel free to call the office or send a message through MyChart. You may also schedule an earlier appointment if necessary.  Additionally, you may be receiving a survey about your experience at our office within a few days to 1 week by e-mail or mail. We value your feedback.  Saralyn Pilar, DO [facility], [place]

## 2020-08-31 ENCOUNTER — Other Ambulatory Visit: Payer: Self-pay | Admitting: Family Medicine

## 2020-08-31 DIAGNOSIS — Z1231 Encounter for screening mammogram for malignant neoplasm of breast: Secondary | ICD-10-CM

## 2020-09-06 ENCOUNTER — Telehealth: Payer: Self-pay

## 2020-09-06 ENCOUNTER — Ambulatory Visit (INDEPENDENT_AMBULATORY_CARE_PROVIDER_SITE_OTHER): Payer: Medicare PPO

## 2020-09-06 VITALS — Ht 66.5 in | Wt 241.0 lb

## 2020-09-06 DIAGNOSIS — Z0000 Encounter for general adult medical examination without abnormal findings: Secondary | ICD-10-CM | POA: Diagnosis not present

## 2020-09-06 NOTE — Patient Instructions (Signed)
[name] , Thank you for taking time to come for your Medicare Wellness Visit. I appreciate your ongoing commitment to your health goals. Please review the following plan we discussed and let me know if I can assist you in the future.   Screening recommendations/referrals: Colonoscopy: completed 03/04/2015, due 03/03/2025 Mammogram: scheduled for 09/22/2020 Bone Density: completed 08/26/2013 Recommended yearly ophthalmology/optometry visit for glaucoma screening and checkup Recommended yearly dental visit for hygiene and checkup  Vaccinations: Influenza vaccine: completed 07/16/2020, due 03/27/2021 Pneumococcal vaccine: n/a Tdap vaccine: n/a Shingles vaccine: discussed   Covid-19: 07/04/2020, 10/10/2019, 09/18/2019  Advanced directives: Please bring a copy of your POA (Power of Attorney) and/or Living Will to your next appointment.   Conditions/risks identified: none  Next appointment: Follow up in one year for your annual wellness visit    Preventive Care 65 Years and Older, Female Preventive care refers to lifestyle choices and visits with your health care provider that can promote health and wellness. What does preventive care include?  A yearly physical exam. This is also called an annual well check.  Dental exams once or twice a year.  Routine eye exams. Ask your health care provider how often you should have your eyes checked.  Personal lifestyle choices, including:  Daily care of your teeth and gums.  Regular physical activity.  Eating a healthy diet.  Avoiding tobacco and drug use.  Limiting alcohol use.  Practicing safe sex.  Taking low-dose aspirin every day.  Taking vitamin and mineral supplements as recommended by your health care provider. What happens during an annual well check? The services and screenings done by your health care provider during your annual well check will depend on your age, overall health, lifestyle risk factors, and family history of  disease. Counseling  Your health care provider may ask you questions about your:  Alcohol use.  Tobacco use.  Drug use.  Emotional well-being.  Home and relationship well-being.  Sexual activity.  Eating habits.  History of falls.  Memory and ability to understand (cognition).  Work and work Astronomer.  Reproductive health. Screening  You may have the following tests or measurements:  Height, weight, and BMI.  Blood pressure.  Lipid and cholesterol levels. These may be checked every 5 years, or more frequently if you are over 93 years old.  Skin check.  Lung cancer screening. You may have this screening every year starting at age 86 if you have a 30-pack-year history of smoking and currently smoke or have quit within the past 15 years.  Fecal occult blood test (FOBT) of the stool. You may have this test every year starting at age 73.  Flexible sigmoidoscopy or colonoscopy. You may have a sigmoidoscopy every 5 years or a colonoscopy every 10 years starting at age 40.  Hepatitis C blood test.  Hepatitis B blood test.  Sexually transmitted disease (STD) testing.  Diabetes screening. This is done by checking your blood sugar (glucose) after you have not eaten for a while (fasting). You may have this done every 1-3 years.  Bone density scan. This is done to screen for osteoporosis. You may have this done starting at age 50.  Mammogram. This may be done every 1-2 years. Talk to your health care provider about how often you should have regular mammograms. Talk with your health care provider about your test results, treatment options, and if necessary, the need for more tests. Vaccines  Your health care provider may recommend certain vaccines, such as:  Influenza vaccine.  This is recommended every year.  Tetanus, diphtheria, and acellular pertussis (Tdap, Td) vaccine. You may need a Td booster every 10 years.  Zoster vaccine. You may need this after age  40.  Pneumococcal 13-valent conjugate (PCV13) vaccine. One dose is recommended after age 73.  Pneumococcal polysaccharide (PPSV23) vaccine. One dose is recommended after age 31. Talk to your health care provider about which screenings and vaccines you need and how often you need them. This information is not intended to replace advice given to you by your health care provider. Make sure you discuss any questions you have with your health care provider. Document Released: 09/09/2015 Document Revised: 05/02/2016 Document Reviewed: 06/14/2015 Elsevier Interactive Patient Education  2017 [place] Prevention in the Home Falls can cause injuries. They can happen to people of all ages. There are many things you can do to make your home safe and to help prevent falls. What can I do on the outside of my home?  Regularly fix the edges of walkways and driveways and fix any cracks.  Remove anything that might make you trip as you walk through a door, such as a raised step or threshold.  Trim any bushes or trees on the path to your home.  Use bright outdoor lighting.  Clear any walking paths of anything that might make someone trip, such as rocks or tools.  Regularly check to see if handrails are loose or broken. Make sure that both sides of any steps have handrails.  Any raised decks and porches should have guardrails on the edges.  Have any leaves, snow, or ice cleared regularly.  Use sand or salt on walking paths during winter.  Clean up any spills in your garage right away. This includes oil or grease spills. What can I do in the bathroom?  Use night lights.  Install grab bars by the toilet and in the tub and shower. Do not use towel bars as grab bars.  Use non-skid mats or decals in the tub or shower.  If you need to sit down in the shower, use a plastic, non-slip stool.  Keep the floor dry. Clean up any water that spills on the floor as soon as it happens.  Remove  soap buildup in the tub or shower regularly.  Attach bath mats securely with double-sided non-slip rug tape.  Do not have throw rugs and other things on the floor that can make you trip. What can I do in the bedroom?  Use night lights.  Make sure that you have a light by your bed that is easy to reach.  Do not use any sheets or blankets that are too big for your bed. They should not hang down onto the floor.  Have a firm chair that has side arms. You can use this for support while you get dressed.  Do not have throw rugs and other things on the floor that can make you trip. What can I do in the kitchen?  Clean up any spills right away.  Avoid walking on wet floors.  Keep items that you use a lot in easy-to-reach places.  If you need to reach something above you, use a strong step stool that has a grab bar.  Keep electrical cords out of the way.  Do not use floor polish or wax that makes floors slippery. If you must use wax, use non-skid floor wax.  Do not have throw rugs and other things on the floor that can make  you trip. What can I do with my stairs?  Do not leave any items on the stairs.  Make sure that there are handrails on both sides of the stairs and use them. Fix handrails that are broken or loose. Make sure that handrails are as long as the stairways.  Check any carpeting to make sure that it is firmly attached to the stairs. Fix any carpet that is loose or worn.  Avoid having throw rugs at the top or bottom of the stairs. If you do have throw rugs, attach them to the floor with carpet tape.  Make sure that you have a light switch at the top of the stairs and the bottom of the stairs. If you do not have them, ask someone to add them for you. What else can I do to help prevent falls?  Wear shoes that:  Do not have high heels.  Have rubber bottoms.  Are comfortable and fit you well.  Are closed at the toe. Do not wear sandals.  If you use a  stepladder:  Make sure that it is fully opened. Do not climb a closed stepladder.  Make sure that both sides of the stepladder are locked into place.  Ask someone to hold it for you, if possible.  Clearly mark and make sure that you can see:  Any grab bars or handrails.  First and last steps.  Where the edge of each step is.  Use tools that help you move around (mobility aids) if they are needed. These include:  Canes.  Walkers.  Scooters.  Crutches.  Turn on the lights when you go into a dark area. Replace any light bulbs as soon as they burn out.  Set up your furniture so you have a clear path. Avoid moving your furniture around.  If any of your floors are uneven, fix them.  If there are any pets around you, be aware of where they are.  Review your medicines with your doctor. Some medicines can make you feel dizzy. This can increase your chance of falling. Ask your doctor what other things that you can do to help prevent falls. This information is not intended to replace advice given to you by your health care provider. Make sure you discuss any questions you have with your health care provider. Document Released: 06/09/2009 Document Revised: 01/19/2016 Document Reviewed: 09/17/2014 Elsevier Interactive Patient Education  2017 [employer].

## 2020-09-06 NOTE — Telephone Encounter (Signed)
Patient consented to telehealth visit. 

## 2020-09-06 NOTE — Progress Notes (Signed)
I connected with Morgan Mcclain today by telephone and verified that I am speaking with the correct person using two identifiers. Location patient: home Location provider: work Persons participating in the virtual visit: Morgan Mcclain, Morgan Mcclain.   I discussed the limitations, risks, security and privacy concerns of performing an evaluation and management service by telephone and the availability of in person appointments. I also discussed with the patient that there may be a patient responsible charge related to this service. The patient expressed understanding and verbally consented to this telephonic visit.    Interactive audio and video telecommunications were attempted between this provider and patient, however failed, due to patient having technical difficulties OR patient did not have access to video capability.  We continued and completed visit with audio only.     Vital signs may be patient reported or missing.  Subjective:   Morgan Mcclain is a 70 y.o. female who presents for Medicare Annual (Subsequent) preventive examination.  Review of Systems     Cardiac Risk Factors include: advanced age (>81men, >36 women);obesity (BMI >30kg/m2);sedentary lifestyle     Objective:    Today's Vitals   09/06/20 1056  Weight: 241 lb (109.3 kg)  Height: 5' 6.5" (1.689 m)   Body mass index is 38.32 kg/m.  Advanced Directives 09/06/2020 09/01/2019 08/05/2018 12/05/2017 09/16/2017 07/30/2017 05/18/2016  Does Patient Have a Medical Advance Directive? Yes Yes Yes Yes Yes Yes No  Type of Estate agent of [place];Living will Living will;Healthcare Power of [employer] Power of [place];Living will - - Healthcare Power of [place];Living will -  Does patient want to make changes to medical advance directive? - - - - No - Patient declined - -  Copy of Healthcare Power of Attorney in Chart? No - copy requested No - copy requested No - copy requested - - No -  copy requested -  Would patient like information on creating a medical advance directive? - - - - - - Yes - Educational materials given  Pre-existing out of facility DNR order (yellow form or pink MOST form) - - - - - - -    Current Medications (verified) Outpatient Encounter Medications as of 09/06/2020  Medication Sig  . Cholecalciferol (VITAMIN D3) 50 MCG (2000 UT) TABS Take 2,000 Units by mouth daily.  . hydrocortisone 2.5 % cream Apply to affected areas 1-2 times daily as needed for itch.  Marland Kitchen TREMFYA 100 MG/ML SOSY Inject 100 mg subcutaneously every 8 weeks as directed.  . vitamin B-12 (CYANOCOBALAMIN) 1000 MCG tablet Take 1,000 mcg by mouth daily.  Marland Kitchen ketoconazole (NIZORAL) 2 % cream Apply 1-2 times daily to body folds prn flares. (Patient not taking: Reported on 09/06/2020)  . rosuvastatin (CRESTOR) 10 MG tablet Take 1 tablet (10 mg total) by mouth at bedtime. (Patient not taking: Reported on 09/06/2020)   No facility-administered encounter medications on file as of 09/06/2020.    Allergies (verified) Lamisil [terbinafine] and Shellfish allergy   History: Past Medical History:  Diagnosis Date  . Arthritis   . Left ureteral calculus   . Psoriasis SEVERE--  RECEIVES LASE TX'S TWICE WEEKLY   Tremfya  . Renal calculus, left   . Sleep apnea 02/23/2015   No longer on CPAP   Past Surgical History:  Procedure Laterality Date  . ABDOMINAL HYSTERECTOMY  1994   W/ UNILATERAL SALPINGOOPHORECTOMY  . BREAST BIOPSY Right 07/14/2019   Affirm Bx- Coil clip-Path pending  . BREAST BIOPSY Left 07/14/2019   Affirm  Bx- X Clip- path pending  . COLONOSCOPY WITH PROPOFOL N/A 03/04/2015   Procedure: COLONOSCOPY WITH PROPOFOL;  Surgeon: Midge Minium, MD;  [facility]: [facility];  Service: Endoscopy;  Laterality: N/A;  . CYSTOSCOPY WITH RETROGRADE PYELOGRAM, URETEROSCOPY AND STENT PLACEMENT Left 12/25/2012   Procedure: CYSTOSCOPY WITH RETROGRADE PYELOGRAM, URETEROSCOPY AND STENT PLACEMENT;  Surgeon:  Sebastian Ache, MD;  Location: [facility];  Service: Urology;  Laterality: Left;  90 MIN NEEDS DIG URETEROSCOPE   . HOLMIUM LASER APPLICATION Left 12/25/2012   Procedure: HOLMIUM LASER APPLICATION;  Surgeon: Sebastian Ache, MD;  Location: [facility];  Service: Urology;  Laterality: Left;  . TONSILLECTOMY  AGE 16   Family History  Problem Relation Age of Onset  . Heart failure Mother   . Arthritis Mother   . Heart attack Mother 60       Secondary CVA  . Cancer Father        lung cancer  . Heart disease Sister 76  . Breast cancer Neg Hx    Social History   Socioeconomic History  . Marital status: Married    Spouse name: Not on file  . Number of children: 2  . Years of education: Administrative Degree 6 yr  . Highest education level: Master's degree (e.g., MA, MS, MEng, MEd, MSW, MBA)  Occupational History  . Occupation: Retired Geologist, engineering  Tobacco Use  . Smoking status: Never Smoker  . Smokeless tobacco: Never Used  Vaping Use  . Vaping Use: Never used  Substance and Sexual Activity  . Alcohol use: No  . Drug use: No  . Sexual activity: Not on file  Other Topics Concern  . Not on file  Social History Narrative  . Not on file   Social Determinants of Health   Financial Resource Strain: Low Risk   . Difficulty of Paying Living Expenses: Not hard at all  Food Insecurity: No Food Insecurity  . Worried About Programme researcher, broadcasting/film/video in the Last Year: Never true  . Ran Out of Food in the Last Year: Never true  Transportation Needs: No Transportation Needs  . Lack of Transportation (Medical): No  . Lack of Transportation (Non-Medical): No  Physical Activity: Inactive  . Days of Exercise per Week: 0 days  . Minutes of Exercise per Session: 0 min  Stress: No Stress Concern Present  . Feeling of Stress : Not at all  Social Connections: Not on file    Tobacco Counseling Counseling given: Not Answered   Clinical Intake:  Pre-visit  preparation completed: Yes  Pain : No/denies pain     Nutritional Status: BMI > 30  Obese Nutritional Risks: None Diabetes: No  How often do you need to have someone help you when you read instructions, pamphlets, or other written materials from your doctor or pharmacy?: 1 - Never What is the last grade level you completed in school?: advanced degree  Diabetic? no  Interpreter Needed?: No  Information entered by :: Morgan Mcclain   Activities of Daily Living In your present state of health, do you have any difficulty performing the following activities: 09/06/2020  Hearing? N  Vision? Y  Comment trouble driving at night  Difficulty concentrating or making decisions? N  Walking or climbing stairs? Y  Dressing or bathing? N  Doing errands, shopping? N  Preparing Food and eating ? N  Using the Toilet? N  In the past six months, have you accidently leaked urine? N  Do you  have problems with loss of bowel control? N  Managing your Medications? N  Managing your Finances? N  Housekeeping or managing your Housekeeping? N  Some recent data might be hidden    Patient Care Team: Smitty Cords, DO as PCP - General (Family Medicine) Willeen Niece, MD (Dermatology) [name], MD as Referring Physician (Internal Medicine)  Indicate any recent Medical Services you may have received from other than [facility] providers in the past year (date may be approximate).     Assessment:   This is a routine wellness examination for Morgan Mcclain.  Hearing/Vision screen No exam data present  Dietary issues and exercise activities discussed: Current Exercise Habits: The patient does not participate in regular exercise at present  Goals    . DIET - INCREASE WATER INTAKE     Recommend drinking at least 6-8 glasses of water a day     . Patient Stated     09/06/2020, wants to get under 200 pounds      Depression Screen PHQ 2/9 Scores 09/06/2020 08/09/2020 11/09/2019 09/01/2019  08/11/2019 02/09/2019 08/05/2018  PHQ - 2 Score 0 0 0 0 0 0 0  PHQ- 9 Score - - - - - - -    Fall Risk Fall Risk  09/06/2020 08/09/2020 11/09/2019 09/01/2019 08/11/2019  Falls in the past year? 1 0 0 0 0  Comment missed a step - - - -  Number falls in past yr: 0 0 0 0 0  Injury with Fall? 0 0 0 0 -  Risk for fall due to : Medication side effect - - - -  Follow up Falls evaluation completed;Education provided;Falls prevention discussed Falls evaluation completed Falls evaluation completed - Falls evaluation completed    FALL RISK PREVENTION PERTAINING TO THE HOME:  Any stairs in or around the home? Yes  If so, are there any without handrails? No  Home free of loose throw rugs in walkways, pet beds, electrical cords, etc? Yes  Adequate lighting in your home to reduce risk of falls? Yes   ASSISTIVE DEVICES UTILIZED TO PREVENT FALLS:  Life alert? No  Use of a cane, walker or w/c? No  Grab bars in the bathroom? Yes  Shower chair or bench in shower? No  Elevated toilet seat or a handicapped toilet? Yes   TIMED UP AND GO:  Was the test performed? No .    Cognitive Function:     6CIT Screen 09/06/2020 08/05/2018 07/30/2017  What Year? 0 points 0 points 0 points  What month? 0 points 0 points 0 points  What time? 0 points 0 points 0 points  Count back from 20 0 points 0 points 0 points  Months in reverse 0 points 0 points 0 points  Repeat phrase 2 points 0 points 2 points  Total Score 2 0 2    Immunizations Immunization History  Administered Date(s) Administered  . Fluad Quad(high Dose 65+) 06/12/2019  . Influenza, High Dose Seasonal PF 07/27/2017, 07/29/2018  . Influenza-Unspecified 04/27/2016, 07/27/2017, 07/16/2020  . PFIZER SARS-COV-2 Vaccination 09/18/2019, 10/10/2019, 07/04/2020  . Pneumococcal Conjugate-13 07/31/2017  . Tdap 08/27/2009  . Zoster 08/27/2013    TDAP status: Up to date  Flu Vaccine status: Up to date  Pneumococcal vaccine status: Due, Education has  been provided regarding the importance of this vaccine. Advised may receive this vaccine at local pharmacy or Health Dept. Aware to provide a copy of the vaccination record if obtained from local pharmacy or Health Dept. Verbalized acceptance and  understanding.  Covid-19 vaccine status: Completed vaccines  Qualifies for Shingles Vaccine? Yes   Zostavax completed Yes   Shingrix Completed?: No.    Education has been provided regarding the importance of this vaccine. Patient has been advised to call insurance company to determine out of pocket expense if they have not yet received this vaccine. Advised may also receive vaccine at local pharmacy or Health Dept. Verbalized acceptance and understanding.  Screening Tests Health Maintenance  Topic Date Due  . TETANUS/TDAP  06/01/2026 (Originally 08/27/2021)  . MAMMOGRAM  07/07/2021  . DEXA SCAN  08/27/2023  . COLONOSCOPY (Pts [id number] Insurance coverage will need to be confirmed)  03/03/2025  . INFLUENZA VACCINE  Completed  . COVID-19 Vaccine  Completed  . Hepatitis C Screening  Completed    Health Maintenance  There are no preventive care reminders to display for this patient.  Colorectal cancer screening: Type of screening: Colonoscopy. Completed 03/04/2015. Repeat every 10 years  Mammogram status: scheduled 09/22/2020  Bone Density Status:  Completed 07/29/2013  Lung Cancer Screening: (Low Dose CT Chest recommended if Age 87-80 years, 30 pack-year currently smoking OR have quit w/in 15years.) does not qualify.   Lung Cancer Screening Referral: no  Additional Screening:  Hepatitis C Screening: does qualify; Completed 06/04/2016  Vision Screening: Recommended annual ophthalmology exams for early detection of glaucoma and other disorders of the eye. Is the patient up to date with their annual eye exam?  Yes  Who is the provider or what is the name of the office in which the patient attends annual eye exams? [facility] If pt is not  established with a provider, would they like to be referred to a provider to establish care? No .   Dental Screening: Recommended annual dental exams for proper oral hygiene  Community Resource Referral / Chronic Care Management: CRR required this visit?  No   CCM required this visit?  No      Plan:     I have personally reviewed and noted the following in the patient's chart:   . Medical and social history . Use of alcohol, tobacco or illicit drugs  . Current medications and supplements . Functional ability and status . Nutritional status . Physical activity . Advanced directives . List of other physicians . Hospitalizations, surgeries, and ER visits in previous 12 months . Vitals . Screenings to include cognitive, depression, and falls . Referrals and appointments  In addition, I have reviewed and discussed with patient certain preventive protocols, quality metrics, and best practice recommendations. A written personalized care plan for preventive services as well as general preventive health recommendations were provided to patient.     Morgan Merinoickeah E Verlyn Lambert, Mcclain   5/40/98111/06/2021   Nurse Notes:

## 2020-09-22 ENCOUNTER — Ambulatory Visit
Admission: RE | Admit: 2020-09-22 | Discharge: 2020-09-22 | Disposition: A | Discharge status: Home / Self Care | Payer: Medicare PPO | Source: Ambulatory Visit | Attending: Family Medicine | Admitting: Family Medicine

## 2020-09-22 ENCOUNTER — Other Ambulatory Visit: Payer: Self-pay

## 2020-09-22 DIAGNOSIS — Z1231 Encounter for screening mammogram for malignant neoplasm of breast: Secondary | ICD-10-CM | POA: Diagnosis not present

## 2020-10-04 ENCOUNTER — Ambulatory Visit: Payer: Medicare PPO | Admitting: Dermatology

## 2020-10-04 ENCOUNTER — Other Ambulatory Visit: Payer: Self-pay

## 2020-10-04 DIAGNOSIS — L409 Psoriasis, unspecified: Secondary | ICD-10-CM | POA: Diagnosis not present

## 2020-10-04 MED ORDER — HYDROCORTISONE 2.5 % EX CREA: TOPICAL_CREAM | CUTANEOUS | 2 refills | Status: AC

## 2020-10-04 MED ORDER — KETOCONAZOLE 2 % EX CREA: TOPICAL_CREAM | CUTANEOUS | 3 refills | Status: DC

## 2020-10-04 MED ORDER — GUSELKUMAB 100 MG/ML ~~LOC~~ SOSY
100.0000 mg | PREFILLED_SYRINGE | Freq: Once | SUBCUTANEOUS | Status: AC
  Administered 2020-10-04: 100 mg via SUBCUTANEOUS

## 2020-10-04 NOTE — Progress Notes (Signed)
   Follow-Up Visit   Subjective  Morgan Mcclain Or Morgan Mcclain is a 70 y.o. female who presents for the following: 6 month follow up\ (Patient follow up on psoriasis on inframammary and nose. Patient states it has cleared up in groin and scalp area. Patient currently using hydrocoritisone 2.5 % cream , ketoconazole 2 % cream and tremfya 100 mg injections every 8 weeks. No side effects from injections. No infections, no illness.).  The following portions of the chart were reviewed this encounter and updated as appropriate:        Objective  Well appearing patient in no apparent distress; mood and affect are within normal limits.  A focused examination was performed including face, chest, abdomen. Relevant physical exam findings are noted in the Assessment and Plan.  Objective  Bilateral inframammary , nose: Hyperpigmented patches inframammary, abd with mild xerosis; hyperpigmented patch on right paranasal  BSA <1% on treatment- good results with Tremfya  Assessment & Plan  Psoriasis Bilateral inframammary , nose  Improved on Tremfya  Continue ketoconazole 2% cream qd/bid to body folds/face Continue hydrocortisone 2.5% cream qd/bid to body folds/face prn flares Continue Tremfya 100mg /mL injections pending insurance approval  Tremfya 100mg /mL injected into left lower abdomen today (sample). Patient tolerated well.  Psoriasis is a chronic non-curable, but treatable genetic/hereditary disease that may have other systemic features affecting other organ systems such as joints (Psoriatic Arthritis). It is associated with an increased risk of inflammatory bowel disease, heart disease, non-alcoholic fatty liver disease, and depression.     Reviewed risks of biologics including immunosuppression, infections, injection site reaction, and failure to improve condition. Goal is control of skin condition, not cure.  Some older biologics such as Humira and Enbrel may slightly increase risk of malignancy  and may worsen congestive heart failure. The use of biologics requires long term medication management, including periodic office visits and monitoring of blood work.   Reordered Medications ketoconazole (NIZORAL) 2 % cream hydrocortisone 2.5 % cream  Return in about 6 months (around 04/03/2021) for psoriasis with Dr . Nurse q 8 weeks for injection. 06/03/2021, CMA, am acting as scribe for Kathie Rhodes, MD.  Documentation: I have reviewed the above documentation for accuracy and completeness, and I agree with the above.  Wendee Beavers MD

## 2020-10-04 NOTE — Patient Instructions (Signed)
Reviewed risks of biologics including immunosuppression, infections, injection site reaction, and failure to improve condition. Goal is control of skin condition, not cure.  Some older biologics such as Humira and Enbrel may slightly increase risk of malignancy and may worsen congestive heart failure. The use of biologics requires long term medication management, including periodic office visits and monitoring of blood work.   

## 2020-11-21 DIAGNOSIS — Z20822 Contact with and (suspected) exposure to covid-19: Secondary | ICD-10-CM | POA: Diagnosis not present

## 2020-11-21 DIAGNOSIS — J069 Acute upper respiratory infection, unspecified: Secondary | ICD-10-CM | POA: Diagnosis not present

## 2020-11-21 DIAGNOSIS — I10 Essential (primary) hypertension: Secondary | ICD-10-CM | POA: Diagnosis not present

## 2020-11-21 DIAGNOSIS — R058 Other specified cough: Secondary | ICD-10-CM | POA: Diagnosis not present

## 2020-11-29 ENCOUNTER — Ambulatory Visit (INDEPENDENT_AMBULATORY_CARE_PROVIDER_SITE_OTHER): Payer: Medicare PPO

## 2020-11-29 ENCOUNTER — Other Ambulatory Visit: Payer: Self-pay

## 2020-11-29 ENCOUNTER — Telehealth: Payer: Self-pay

## 2020-11-29 DIAGNOSIS — L400 Psoriasis vulgaris: Secondary | ICD-10-CM

## 2020-11-29 MED ORDER — GUSELKUMAB 100 MG/ML ~~LOC~~ SOSY
100.0000 mg | PREFILLED_SYRINGE | Freq: Once | SUBCUTANEOUS | Status: AC
  Administered 2020-11-29: 100 mg via SUBCUTANEOUS

## 2020-11-29 NOTE — Telephone Encounter (Signed)
We could consider switching her to [facility], which is a part B medical benefit and doesn't go through pharmacy, so it might bypass patient assistance.  She would need to get her injections in [place] at [place].  They will also do the PA if she is interested.  The injection is every 3 months.  I would still need to see her every 6 months.

## 2020-11-29 NOTE — Telephone Encounter (Signed)
Patient here today for 8 week Tremfya injection. She is still flaring on her nose per the last visit. Patient would like to know your thoughts on switching biologics. Patient also does not think she will qualify for patient assistance.

## 2020-11-29 NOTE — Progress Notes (Signed)
Patient here for 8 week Tremfya injection.  Tremfya 100mg /mL injected into right abdomen. Patient tolerated well.  LOT: [id number] EXP: 12/2021

## 2020-11-30 NOTE — Telephone Encounter (Signed)
Patient advised of information per Dr. Roseanne Reno.  She was in the middle of a appointment and would call me back this PM.

## 2021-01-24 ENCOUNTER — Other Ambulatory Visit: Payer: Self-pay

## 2021-01-24 ENCOUNTER — Ambulatory Visit (INDEPENDENT_AMBULATORY_CARE_PROVIDER_SITE_OTHER): Payer: Medicare PPO

## 2021-01-24 DIAGNOSIS — L400 Psoriasis vulgaris: Secondary | ICD-10-CM | POA: Diagnosis not present

## 2021-01-24 MED ORDER — GUSELKUMAB 100 MG/ML ~~LOC~~ SOSY
100.0000 mg | PREFILLED_SYRINGE | Freq: Once | SUBCUTANEOUS | Status: AC
  Administered 2021-01-24: 100 mg via SUBCUTANEOUS

## 2021-01-24 NOTE — Progress Notes (Signed)
Patient here today for 8 week Tremfya injection.   Tremfya 100mg /mL injected into left lower abdomen. Patient tolerated well.   LOT: [id number] EXP: 12/2021

## 2021-01-25 ENCOUNTER — Encounter: Payer: Self-pay | Admitting: Family Medicine

## 2021-01-25 ENCOUNTER — Ambulatory Visit: Payer: Medicare PPO | Admitting: Family Medicine

## 2021-01-25 ENCOUNTER — Telehealth: Payer: Self-pay | Admitting: Family Medicine

## 2021-01-25 VITALS — BP 130/70 | HR 61 | Ht 66.5 in | Wt 246.4 lb

## 2021-01-25 DIAGNOSIS — M25562 Pain in left knee: Secondary | ICD-10-CM | POA: Diagnosis not present

## 2021-01-25 DIAGNOSIS — Z6841 Body Mass Index (BMI) 40.0 and over, adult: Secondary | ICD-10-CM | POA: Diagnosis not present

## 2021-01-25 DIAGNOSIS — R030 Elevated blood-pressure reading, without diagnosis of hypertension: Secondary | ICD-10-CM | POA: Diagnosis not present

## 2021-01-25 DIAGNOSIS — L4050 Arthropathic psoriasis, unspecified: Secondary | ICD-10-CM | POA: Diagnosis not present

## 2021-01-25 DIAGNOSIS — G8929 Other chronic pain: Secondary | ICD-10-CM | POA: Diagnosis not present

## 2021-01-25 DIAGNOSIS — R7303 Prediabetes: Secondary | ICD-10-CM | POA: Diagnosis not present

## 2021-01-25 NOTE — Assessment & Plan Note (Signed)
Previously elevated A1c DUE Today for A1c Concern with obesity, HTN, HLD  Plan:  Error on fingerstick A1c, she agrees for lab A1c draw  1. Not on any therapy currently  2. Encourage improved lifestyle - low carb, low sugar diet, reduce portion size, continue improving regular exercise 3. Follow-up q 6 mo lab

## 2021-01-25 NOTE — Progress Notes (Signed)
Subjective:    Patient ID: Morgan Mcclain, female    DOB: [birth year], 70 y.o.   MRN: [id number]  Morgan Mcclain is a 70 y.o. female presenting on 01/25/2021 for Prediabetes, Hypertension, and Hyperlipidemia   HPI   Taste Bud Problem History of Dental Work No prior history of COVID. Onset recently. She had root canal.  Worried about Kidney. With urination.  Pre-HTN No recent home BP readings, she has previously done Home BP readings have been normal. Not taking HCTZ regularly - has discontinued this med. Controlled edema LE on compression and diuretic PRN Denies CP, dyspnea, HA, dizziness / lightheadedness  Pre-Diabetes /HYPERLIPIDEMIA/ MORBID OBESITY BMI >39 Last resultA1cstill at5.7, similar to prior results in mild Pre-DM Last lab lipid LDL down from 140 to 103 and TC 200 to 163 on statin now. Tolerating well, taking Rosuvastatin 10mg  daily Lifestyle - Weightup +6 lbs in >6-9 months - Diet:Still improving diet, reduced portion size not always adhering - Exercise:goal to start moreregular walking and exercisestill Denies hypoglycemia  Vitamin D Deficiency Vitamin B12 Deficiency Currently doing well on both vitamins, last lab Vitamin D 33, and Last B12 was 566, both in normal range. Taking supplements for both.  Psoriatic Arthritis / OA/DJD Followed by Dermatology on medication management.  Additional history - R ankle sprain or twist while back.    Depression screen [facility] 2/9 01/25/2021 09/06/2020 08/09/2020  Decreased Interest 0 0 0  Down, Depressed, Hopeless 0 0 0  PHQ - 2 Score 0 0 0  Altered sleeping 0 - -  Tired, decreased energy 1 - -  Change in appetite 1 - -  Feeling bad or failure about yourself  0 - -  Trouble concentrating 0 - -  Moving slowly or fidgety/restless 0 - -  Suicidal thoughts 0 - -  PHQ-9 Score 2 - -  Difficult doing work/chores Not difficult at all - -    Social History   Tobacco Use  . Smoking status: Never Smoker  .  Smokeless tobacco: Never Used  Vaping Use  . Vaping Use: Never used  Substance Use Topics  . Alcohol use: No  . Drug use: No    Review of Systems Per HPI unless specifically indicated above     Objective:    BP 130/70 (BP Location: Left Arm, Cuff Size: Normal)   Pulse 61   Ht 5' 6.5" (1.689 m)   Wt 246 lb 6.4 oz (111.8 kg)   SpO2 100%   BMI 39.17 kg/m   Wt Readings from Last 3 Encounters:  01/25/21 246 lb 6.4 oz (111.8 kg)  09/06/20 241 lb (109.3 kg)  08/09/20 244 lb (110.7 kg)    Physical Exam Vitals and nursing note reviewed.  Constitutional:      General: She is not in acute distress.    Appearance: She is well-developed. She is obese. She is not diaphoretic.     Comments: Well-appearing, comfortable, cooperative  HENT:     Head: Normocephalic and atraumatic.  Eyes:     General:        Right eye: No discharge.        Left eye: No discharge.     Conjunctiva/sclera: Conjunctivae normal.  Neck:     Thyroid: No thyromegaly.  Cardiovascular:     Rate and Rhythm: Normal rate and regular rhythm.     Heart sounds: Normal heart sounds. No murmur heard.   Pulmonary:     Effort: Pulmonary effort is normal. No respiratory  distress.     Breath sounds: Normal breath sounds. No wheezing or rales.  Musculoskeletal:        General: Normal range of motion.     Cervical back: Normal range of motion and neck supple.  Lymphadenopathy:     Cervical: No cervical adenopathy.  Skin:    General: Skin is warm and dry.     Findings: No erythema or rash.  Neurological:     Mental Status: She is alert and oriented to person, place, and time.  Psychiatric:        Behavior: Behavior normal.     Comments: Well groomed, good eye contact, normal speech and thoughts       Results for orders placed or performed in visit on 08/01/20  VITAMIN D 25 Hydroxy (Vit-D Deficiency, Fractures)  Result Value Ref Range   Vit D, 25-Hydroxy 33 30 - 100 ng/mL  Vitamin B12  Result Value Ref Range    Vitamin B-12 566 200 - 1,100 pg/mL  TSH  Result Value Ref Range   TSH 1.01 0.40 - 4.50 mIU/L  Lipid panel  Result Value Ref Range   Cholesterol 178 <200 mg/dL   HDL 47 (L) > OR = 50 mg/dL   Triglycerides 63 <811 mg/dL   LDL Cholesterol (Calc) 116 (H) mg/dL (calc)   Total CHOL/HDL Ratio 3.8 <5.0 (calc)   Non-HDL Cholesterol (Calc) 131 (H) <130 mg/dL (calc)  COMPLETE METABOLIC PANEL WITH GFR  Result Value Ref Range   Glucose, Bld 77 65 - 99 mg/dL   BUN 12 7 - 25 mg/dL   Creat 9.14 7.82 - 9.56 mg/dL   GFR, Est Non African American 87 > OR = 60 mL/min/1.61m2   GFR, Est African American 101 > OR = 60 mL/min/1.62m2   BUN/Creatinine Ratio NOT APPLICABLE 6 - 22 (calc)   Sodium 139 135 - 146 mmol/L   Potassium 3.9 3.5 - 5.3 mmol/L   Chloride 104 98 - 110 mmol/L   CO2 28 20 - 32 mmol/L   Calcium 9.4 8.6 - 10.4 mg/dL   Total Protein 7.4 6.1 - 8.1 g/dL   Albumin 4.1 3.6 - 5.1 g/dL   Globulin 3.3 1.9 - 3.7 g/dL (calc)   AG Ratio 1.2 1.0 - 2.5 (calc)   Total Bilirubin 0.5 0.2 - 1.2 mg/dL   Alkaline phosphatase (APISO) 96 37 - 153 U/L   AST 16 10 - 35 U/L   ALT 9 6 - 29 U/L  CBC with Differential/Platelet  Result Value Ref Range   WBC 4.6 3.8 - 10.8 Thousand/uL   RBC 4.34 3.80 - 5.10 Million/uL   Hemoglobin 13.9 11.7 - 15.5 g/dL   HCT 21.3 08.6 - 57.8 %   MCV 95.2 80.0 - 100.0 fL   MCH 32.0 27.0 - 33.0 pg   MCHC 33.7 32.0 - 36.0 g/dL   RDW 46.9 62.9 - 52.8 %   Platelets 205 140 - 400 Thousand/uL   MPV 11.2 7.5 - 12.5 fL   Neutro Abs 2,673 1,500 - 7,800 cells/uL   Lymphs Abs 1,224 850 - 3,900 cells/uL   Absolute Monocytes 561 200 - 950 cells/uL   Eosinophils Absolute 101 15 - 500 cells/uL   Basophils Absolute 41 0 - 200 cells/uL   Neutrophils Relative % 58.1 %   Total Lymphocyte 26.6 %   Monocytes Relative 12.2 %   Eosinophils Relative 2.2 %   Basophils Relative 0.9 %  Hemoglobin A1c  Result Value Ref Range  Hgb A1c MFr Bld 5.9 (H) <5.7 % of total Hgb   Mean Plasma  Glucose 123 mg/dL   eAG (mmol/L) 6.8 mmol/L      Assessment & Plan:   Problem List Items Addressed This Visit    Psoriasis with arthropathy (HCC)   Pre-hypertension    Repeat BP improved to normal Limited home readings, needs to monitor BP regularly Reassurance, lifestyle management  We discussed may be time to start new BP medication in near future if still elevated at repeat visit, goal is for wt loss to help improve BP.      Pre-diabetes    Previously elevated A1c DUE Today for A1c Concern with obesity, HTN, HLD  Plan:  Error on fingerstick A1c, she agrees for lab A1c draw  1. Not on any therapy currently  2. Encourage improved lifestyle - low carb, low sugar diet, reduce portion size, continue improving regular exercise 3. Follow-up q 6 mo lab      Relevant Orders   HgB A1c   Morbid obesity with BMI of 40.0-44.9, adult (HCC) - Primary    Counseling on wt loss options Encourage continue lifestyle with diet  We reviewed options again with GLP1 therapy, gave her handout with information to consider these, we will check A1c now and triage, if elevated may be able to cover or if stable or improved A1c can consider Saxenda / MJ.Hock.  Handout on referral to Wt Management [facility]) office if interested.  Follow-up sooner if she is interested in these options or at 3 month visit to see her progress and then proceed with assistance on wt loss.       Other Visit Diagnoses    Chronic pain of left knee           Note she had history of Knee Injury occurred on 07/16/21 with Left Knee Injury that caused pain and prevented her from working on the following dates - 07/18/2020 - 07/22/20. She was seen about 2 weeks later in office for her Annual Physical and had improved. She does request a doctors note for those dates.   No orders of the defined types were placed in this encounter.    Follow up plan: Return in about 3 months (around 04/27/2021) for 1 week nurse visit POC  A1c - then 3 months for weight loss medication.   Saralyn Pilar, DO [facility] 01/25/2021, 11:03 AM

## 2021-01-25 NOTE — Patient Instructions (Addendum)
Thank you for coming to the office today.  GLP1 medications for weight loss  Saxenda (once daily dose) injection dose increase every week  Wegovy (once weekly dose) injection, increase every month  Or if sugar is elevated Check if insurance will pay for Pre-Diabetes  Ozempic (weekly) Trulicity (weekly)  -------------------------------------------  If taste buds do not respond within next 3 months, let me know we can refer to ENT.  Check with Dentist first.  Kidney function was normal on last blood work. No concerns.  --------------------------------------------------------  They do informational and may need a referral if you want to proceed.  Dr Dalbert Garnet  [facility] [address] [place], [place] [address] Ph: [phone]   Please schedule a Follow-up Appointment to: Return in about 3 months (around 04/27/2021) for 1 week nurse visit POC A1c - then 3 months for weight loss medication.  If you have any other questions or concerns, please feel free to call the office or send a message through MyChart. You may also schedule an earlier appointment if necessary.  Additionally, you may be receiving a survey about your experience at our office within a few days to 1 week by e-mail or mail. We value your feedback.  Saralyn Pilar, DO [facility], [place]

## 2021-01-25 NOTE — Telephone Encounter (Signed)
Please notify her that I have sent her work note to [facility], for the days she missed 07/18/20 - 07/22/20.  If she needs a printed and signed copy, let me know. Otherwise she can submit the one on mychart if that will work for her.  Saralyn Pilar, DO [facility] 01/25/2021, 5:37 PM

## 2021-01-25 NOTE — Assessment & Plan Note (Signed)
Repeat BP improved to normal Limited home readings, needs to monitor BP regularly Reassurance, lifestyle management  We discussed may be time to start new BP medication in near future if still elevated at repeat visit, goal is for wt loss to help improve BP.

## 2021-01-25 NOTE — Assessment & Plan Note (Signed)
Counseling on wt loss options Encourage continue lifestyle with diet  We reviewed options again with GLP1 therapy, gave her handout with information to consider these, we will check A1c now and triage, if elevated may be able to cover or if stable or improved A1c can consider Saxenda / KGOVPC.  Handout on referral to Wt Management [facility]) office if interested.  Follow-up sooner if she is interested in these options or at 3 month visit to see her progress and then proceed with assistance on wt loss.

## 2021-01-26 LAB — HEMOGLOBIN A1C
Hgb A1c MFr Bld: 5.6 % of total Hgb (ref <5.7)
Mean Plasma Glucose: 114 mg/dL
eAG (mmol/L): 6.3 mmol/L

## 2021-01-26 NOTE — Telephone Encounter (Signed)
I left a message letting her know about Dr. Charm Rings message and to let us know if she needs a printed copy of the requested letter.

## 2021-01-31 NOTE — Telephone Encounter (Signed)
Pt returned call and says she does need a printed copy of requested letter and she can pick it up at 11:30.

## 2021-01-31 NOTE — Telephone Encounter (Signed)
Pt came in and picked up the signed letter.

## 2021-03-06 ENCOUNTER — Other Ambulatory Visit: Payer: Self-pay

## 2021-03-06 DIAGNOSIS — L409 Psoriasis, unspecified: Secondary | ICD-10-CM

## 2021-03-06 MED ORDER — TREMFYA 100 MG/ML ~~LOC~~ SOSY: PREFILLED_SYRINGE | SUBCUTANEOUS | 0 refills | Status: DC

## 2021-03-21 ENCOUNTER — Telehealth: Payer: Self-pay

## 2021-03-21 NOTE — Telephone Encounter (Signed)
Pt walked in the office to speak to a nurse, she received a phone call from [facility] pharmacy about a change in her rx, pt report she does not want to change her Tremfya rx, she would like to continue Tremfya, she did discuss with Dr Roseanne Reno was anything cheaper but the rx Dr Roseanne Reno recommended -pt would have to go to [place] to get, so pt would like to stay on Tremyfa because she will not drive to [place].  Discussed with pt we have not sent a new rx to [place] and [name] was probably calling about her current Tremyfa rx.

## 2021-03-28 ENCOUNTER — Ambulatory Visit: Payer: Medicare PPO | Admitting: Dermatology

## 2021-03-28 ENCOUNTER — Other Ambulatory Visit: Payer: Self-pay

## 2021-03-28 DIAGNOSIS — L409 Psoriasis, unspecified: Secondary | ICD-10-CM

## 2021-03-28 MED ORDER — GUSELKUMAB 100 MG/ML ~~LOC~~ SOSY
100.0000 mg | PREFILLED_SYRINGE | Freq: Once | SUBCUTANEOUS | Status: AC
  Administered 2021-03-28: 100 mg via SUBCUTANEOUS

## 2021-03-28 NOTE — Progress Notes (Signed)
   Follow-Up Visit   Subjective  Morgan Mcclain is a 70 y.o. female who presents for the following: Psoriasis (Pt treating with Tremfya. She reports good results. Pt treats with ketoconazole cream and hydrocortisone prn flares which she states does not happen often. Pt states that she has to pay approx $200 every time she orders the tremfya and woul dnot qualify for pt assistance.  ). No side effects with Tremfya.  Is happy with results, just not the cost    The following portions of the chart were reviewed this encounter and updated as appropriate:      Review of Systems: No other skin or systemic complaints except as noted in HPI or Assessment and Plan.   Objective  Well appearing patient in no apparent distress; mood and affect are within normal limits.  A focused examination was performed including inframammary, face. Relevant physical exam findings are noted in the Assessment and Plan.  b/l inframammary, face Hyperpigmented patches inframammary Small pink scaly patch right inframammary Hypopigmentation on frontal hairline  BSA% <1 on Tremfya   Assessment & Plan  Psoriasis b/l inframammary, face  Well-controlled on Tremfya- consider change to Ilumya due to expense  Psoriasis - severe on systemic "biologic" treatment injections.  Psoriasis is a chronic non-curable, but treatable genetic/hereditary disease that may have other systemic features affecting other organ systems such as joints (Psoriatic Arthritis).  It is linked with heart disease, inflammatory bowel disease, non-alcoholic fatty liver disease, and depression. Significant skin psoriasis and/or psoriatic arthritis may have significant symptoms and affects activities of daily activity and often benefits from systemic "biologic" injection treatments.  These "biologic" treatments have some potential side effects including immunosuppression and require pre-treatment laboratory screening and periodic laboratory  monitoring and periodic in person evaluation and monitoring by the attending dermatologist physician.   Discussed switching to Illumya to run through medical benefits rather than pharmacy benefits fro Tremfya. Pt states that she has been having to pay approx $200 every time she orders it and it is expensive for her  Will send in Ilumya to see what out of pocket cost would be for pt. She is aware she would need to travel to [facility] in [place] for injection q3 mos.  Continue Tremfya q 2 mos for now Continue ketoconazole 2% cream qd/bid to body folds Continue HC 2.5% cream qd/bid to aas body folds prn flares  Tremfya injected today into right abdomen. Pt tolerated well.  Lot: [id number] Exp: 05/2022  Reviewed risks of biologics including immunosuppression, infections, injection site reaction, and failure to improve condition. Goal is control of skin condition, not cure.  Some older biologics such as Humira and Enbrel may slightly increase risk of malignancy and may worsen congestive heart failure. The use of biologics requires long term medication management, including periodic office visits and monitoring of blood work.   Related Medications ketoconazole (NIZORAL) 2 % cream Apply 1-2 times daily to body folds prn flares.  hydrocortisone 2.5 % cream Apply to affected areas 1-2 times daily as needed for itch.  TREMFYA 100 MG/ML SOSY Inject 100 mg subcutaneously every 8 weeks as directed.  Guselkumab SOSY 100 mg   Return in about 6 months (around 09/28/2021) for psoriasis.  I, Morgan Mcclain, CMA, am acting as scribe for Willeen Niece, MD.  Documentation: I have reviewed the above documentation for accuracy and completeness, and I agree with the above.  Willeen Niece MD

## 2021-04-17 ENCOUNTER — Telehealth: Payer: Self-pay

## 2021-04-17 NOTE — Telephone Encounter (Signed)
Patient's Ilumya will not be covered at this time. Insurance wants patient to try and fail Remicade, Inflectra, Infliximab AND stelara. Patient will continue Tremfya at this time due to coverage and patient has good results. Patient has been advised of information.

## 2021-05-10 ENCOUNTER — Other Ambulatory Visit: Payer: Self-pay

## 2021-05-10 DIAGNOSIS — L409 Psoriasis, unspecified: Secondary | ICD-10-CM

## 2021-05-10 MED ORDER — TREMFYA 100 MG/ML ~~LOC~~ SOSY: PREFILLED_SYRINGE | SUBCUTANEOUS | 1 refills | Status: DC

## 2021-05-10 NOTE — Progress Notes (Signed)
Tremfya Refills

## 2021-05-23 ENCOUNTER — Other Ambulatory Visit: Payer: Self-pay

## 2021-05-23 ENCOUNTER — Ambulatory Visit (INDEPENDENT_AMBULATORY_CARE_PROVIDER_SITE_OTHER): Payer: Medicare PPO

## 2021-05-23 DIAGNOSIS — L400 Psoriasis vulgaris: Secondary | ICD-10-CM | POA: Diagnosis not present

## 2021-05-23 MED ORDER — GUSELKUMAB 100 MG/ML ~~LOC~~ SOSY
100.0000 mg | PREFILLED_SYRINGE | Freq: Once | SUBCUTANEOUS | Status: AC
  Administered 2021-05-23: 100 mg via SUBCUTANEOUS

## 2021-05-23 NOTE — Progress Notes (Signed)
Patient here for eight week Tremfya injection.   Tremfya 100mg /mL injected into left lower abdomen. Patient tolerated well.   LOT: [id number] EXP: 06/2022

## 2021-07-10 ENCOUNTER — Telehealth: Payer: Self-pay

## 2021-07-10 NOTE — Telephone Encounter (Signed)
Pt LM on VM she would like to reschedule her tremyfa injection from Nov 22 to Nov 29  LM on VM please call her to schedule injection on Nov 29

## 2021-07-18 ENCOUNTER — Ambulatory Visit: Payer: Medicare PPO

## 2021-07-25 ENCOUNTER — Other Ambulatory Visit: Payer: Self-pay

## 2021-07-25 ENCOUNTER — Ambulatory Visit (INDEPENDENT_AMBULATORY_CARE_PROVIDER_SITE_OTHER): Payer: Medicare PPO

## 2021-07-25 DIAGNOSIS — L400 Psoriasis vulgaris: Secondary | ICD-10-CM

## 2021-07-25 MED ORDER — GUSELKUMAB 100 MG/ML ~~LOC~~ SOSY
100.0000 mg | PREFILLED_SYRINGE | Freq: Once | SUBCUTANEOUS | Status: AC
  Administered 2021-07-25: 100 mg via SUBCUTANEOUS

## 2021-07-25 NOTE — Progress Notes (Signed)
Patient here for 8 week Tremfya injection. Tremfya 100mg /mL injected into right lower abdomen. Patient tolerated well.  LOT: .AA EXP: 09/2022

## 2021-08-22 DIAGNOSIS — Z79620 Long term (current) use of immunosuppressive biologic: Secondary | ICD-10-CM | POA: Diagnosis not present

## 2021-08-22 DIAGNOSIS — L400 Psoriasis vulgaris: Secondary | ICD-10-CM | POA: Diagnosis not present

## 2021-08-22 DIAGNOSIS — Z8249 Family history of ischemic heart disease and other diseases of the circulatory system: Secondary | ICD-10-CM | POA: Diagnosis not present

## 2021-08-22 DIAGNOSIS — Z823 Family history of stroke: Secondary | ICD-10-CM | POA: Diagnosis not present

## 2021-08-22 DIAGNOSIS — Z801 Family history of malignant neoplasm of trachea, bronchus and lung: Secondary | ICD-10-CM | POA: Diagnosis not present

## 2021-08-22 DIAGNOSIS — R32 Unspecified urinary incontinence: Secondary | ICD-10-CM | POA: Diagnosis not present

## 2021-08-22 DIAGNOSIS — E669 Obesity, unspecified: Secondary | ICD-10-CM | POA: Diagnosis not present

## 2021-08-22 DIAGNOSIS — M1990 Unspecified osteoarthritis, unspecified site: Secondary | ICD-10-CM | POA: Diagnosis not present

## 2021-08-23 ENCOUNTER — Other Ambulatory Visit: Payer: Self-pay | Admitting: Family Medicine

## 2021-08-23 DIAGNOSIS — Z1231 Encounter for screening mammogram for malignant neoplasm of breast: Secondary | ICD-10-CM

## 2021-08-31 ENCOUNTER — Other Ambulatory Visit: Payer: Self-pay

## 2021-08-31 DIAGNOSIS — L409 Psoriasis, unspecified: Secondary | ICD-10-CM

## 2021-08-31 MED ORDER — TREMFYA 100 MG/ML ~~LOC~~ SOSY: PREFILLED_SYRINGE | SUBCUTANEOUS | 0 refills | Status: DC

## 2021-09-04 ENCOUNTER — Telehealth: Payer: Self-pay | Admitting: Family Medicine

## 2021-09-04 NOTE — Telephone Encounter (Signed)
LM advising patient to call us back so we can reschedule her AWV appointment on 09/12/21. Need to reschedule to a Friday due to [facility] schedule change.

## 2021-09-12 ENCOUNTER — Ambulatory Visit: Payer: Medicare PPO

## 2021-09-28 ENCOUNTER — Ambulatory Visit
Admission: RE | Admit: 2021-09-28 | Discharge: 2021-09-28 | Disposition: A | Discharge status: Home / Self Care | Payer: Medicare PPO | Source: Ambulatory Visit | Attending: Family Medicine | Admitting: Family Medicine

## 2021-09-28 ENCOUNTER — Other Ambulatory Visit: Payer: Self-pay

## 2021-09-28 DIAGNOSIS — Z1231 Encounter for screening mammogram for malignant neoplasm of breast: Secondary | ICD-10-CM

## 2021-09-28 DIAGNOSIS — L409 Psoriasis, unspecified: Secondary | ICD-10-CM | POA: Insufficient documentation

## 2021-09-29 ENCOUNTER — Ambulatory Visit (INDEPENDENT_AMBULATORY_CARE_PROVIDER_SITE_OTHER): Payer: Medicare PPO

## 2021-09-29 ENCOUNTER — Encounter: Payer: Self-pay | Admitting: Family Medicine

## 2021-09-29 ENCOUNTER — Encounter: Payer: Medicare PPO | Admitting: Family Medicine

## 2021-09-29 ENCOUNTER — Ambulatory Visit (INDEPENDENT_AMBULATORY_CARE_PROVIDER_SITE_OTHER): Payer: Medicare PPO | Admitting: Family Medicine

## 2021-09-29 VITALS — BP 134/76 | HR 61 | Ht 67.0 in | Wt 247.0 lb

## 2021-09-29 VITALS — BP 134/76 | HR 61 | Temp 98.2°F | Ht 67.0 in | Wt 247.2 lb

## 2021-09-29 DIAGNOSIS — R7303 Prediabetes: Secondary | ICD-10-CM

## 2021-09-29 DIAGNOSIS — E782 Mixed hyperlipidemia: Secondary | ICD-10-CM

## 2021-09-29 DIAGNOSIS — Z780 Asymptomatic menopausal state: Secondary | ICD-10-CM

## 2021-09-29 DIAGNOSIS — R030 Elevated blood-pressure reading, without diagnosis of hypertension: Secondary | ICD-10-CM

## 2021-09-29 DIAGNOSIS — Z0000 Encounter for general adult medical examination without abnormal findings: Secondary | ICD-10-CM

## 2021-09-29 DIAGNOSIS — L4050 Arthropathic psoriasis, unspecified: Secondary | ICD-10-CM | POA: Diagnosis not present

## 2021-09-29 NOTE — Patient Instructions (Signed)
Morgan Mcclain , Thank you for taking time to come for your Medicare Wellness Visit. I appreciate your ongoing commitment to your health goals. Please review the following plan we discussed and let me know if I can assist you in the future.   Screening recommendations/referrals: Colonoscopy: 03/04/15 Mammogram: 09/28/21 Bone Density: 08/26/13, due now. Referral sent Recommended yearly ophthalmology/optometry visit for glaucoma screening and checkup Recommended yearly dental visit for hygiene and checkup  Vaccinations: Influenza vaccine: has this season Pneumococcal vaccine: 07/31/17 Tdap vaccine: 08/28/11, declined Shingles vaccine: Zostavax 08/27/13   Covid-19:09/18/19, 10/10/19, 07/04/20  Advanced directives: no  Conditions/risks identified: none  Next appointment: Follow up in one year for your annual wellness visit 10/05/22 @ 9:40am in person   Preventive Care 71 Years and Older, Female Preventive care refers to lifestyle choices and visits with your health care provider that can promote health and wellness. What does preventive care include? A yearly physical exam. This is also called an annual well check. Dental exams once or twice a year. Routine eye exams. Ask your health care provider how often you should have your eyes checked. Personal lifestyle choices, including: Daily care of your teeth and gums. Regular physical activity. Eating a healthy diet. Avoiding tobacco and drug use. Limiting alcohol use. Practicing safe sex. Taking low-dose aspirin every day. Taking vitamin and mineral supplements as recommended by your health care provider. What happens during an annual well check? The services and screenings done by your health care provider during your annual well check will depend on your age, overall health, lifestyle risk factors, and family history of disease. Counseling  Your health care provider may ask you questions about your: Alcohol use. Tobacco use. Drug use. Emotional  well-being. Home and relationship well-being. Sexual activity. Eating habits. History of falls. Memory and ability to understand (cognition). Work and work Astronomer. Reproductive health. Screening  You may have the following tests or measurements: Height, weight, and BMI. Blood pressure. Lipid and cholesterol levels. These may be checked every 5 years, or more frequently if you are over 58 years old. Skin check. Lung cancer screening. You may have this screening every year starting at age 63 if you have a 30-pack-year history of smoking and currently smoke or have quit within the past 15 years. Fecal occult blood test (FOBT) of the stool. You may have this test every year starting at age 27. Flexible sigmoidoscopy or colonoscopy. You may have a sigmoidoscopy every 5 years or a colonoscopy every 10 years starting at age 8. Hepatitis C blood test. Hepatitis B blood test. Sexually transmitted disease (STD) testing. Diabetes screening. This is done by checking your blood sugar (glucose) after you have not eaten for a while (fasting). You may have this done every 1-3 years. Bone density scan. This is done to screen for osteoporosis. You may have this done starting at age 71. Mammogram. This may be done every 1-2 years. Talk to your health care provider about how often you should have regular mammograms. Talk with your health care provider about your test results, treatment options, and if necessary, the need for more tests. Vaccines  Your health care provider may recommend certain vaccines, such as: Influenza vaccine. This is recommended every year. Tetanus, diphtheria, and acellular pertussis (Tdap, Td) vaccine. You may need a Td booster every 10 years. Zoster vaccine. You may need this after age 101. Pneumococcal 13-valent conjugate (PCV13) vaccine. One dose is recommended after age 18. Pneumococcal polysaccharide (PPSV23) vaccine. One dose is recommended after  age 52. Talk to your  health care provider about which screenings and vaccines you need and how often you need them. This information is not intended to replace advice given to you by your health care provider. Make sure you discuss any questions you have with your health care provider. Document Released: 09/09/2015 Document Revised: 05/02/2016 Document Reviewed: 06/14/2015 Elsevier Interactive Patient Education  2017 [place] Prevention in the Home Falls can cause injuries. They can happen to people of all ages. There are many things you can do to make your home safe and to help prevent falls. What can I do on the outside of my home? Regularly fix the edges of walkways and driveways and fix any cracks. Remove anything that might make you trip as you walk through a door, such as a raised step or threshold. Trim any bushes or trees on the path to your home. Use bright outdoor lighting. Clear any walking paths of anything that might make someone trip, such as rocks or tools. Regularly check to see if handrails are loose or broken. Make sure that both sides of any steps have handrails. Any raised decks and porches should have guardrails on the edges. Have any leaves, snow, or ice cleared regularly. Use sand or salt on walking paths during winter. Clean up any spills in your garage right away. This includes oil or grease spills. What can I do in the bathroom? Use night lights. Install grab bars by the toilet and in the tub and shower. Do not use towel bars as grab bars. Use non-skid mats or decals in the tub or shower. If you need to sit down in the shower, use a plastic, non-slip stool. Keep the floor dry. Clean up any water that spills on the floor as soon as it happens. Remove soap buildup in the tub or shower regularly. Attach bath mats securely with double-sided non-slip rug tape. Do not have throw rugs and other things on the floor that can make you trip. What can I do in the bedroom? Use night  lights. Make sure that you have a light by your bed that is easy to reach. Do not use any sheets or blankets that are too big for your bed. They should not hang down onto the floor. Have a firm chair that has side arms. You can use this for support while you get dressed. Do not have throw rugs and other things on the floor that can make you trip. What can I do in the kitchen? Clean up any spills right away. Avoid walking on wet floors. Keep items that you use a lot in easy-to-reach places. If you need to reach something above you, use a strong step stool that has a grab bar. Keep electrical cords out of the way. Do not use floor polish or wax that makes floors slippery. If you must use wax, use non-skid floor wax. Do not have throw rugs and other things on the floor that can make you trip. What can I do with my stairs? Do not leave any items on the stairs. Make sure that there are handrails on both sides of the stairs and use them. Fix handrails that are broken or loose. Make sure that handrails are as long as the stairways. Check any carpeting to make sure that it is firmly attached to the stairs. Fix any carpet that is loose or worn. Avoid having throw rugs at the top or bottom of the stairs. If you do  have throw rugs, attach them to the floor with carpet tape. Make sure that you have a light switch at the top of the stairs and the bottom of the stairs. If you do not have them, ask someone to add them for you. What else can I do to help prevent falls? Wear shoes that: Do not have high heels. Have rubber bottoms. Are comfortable and fit you well. Are closed at the toe. Do not wear sandals. If you use a stepladder: Make sure that it is fully opened. Do not climb a closed stepladder. Make sure that both sides of the stepladder are locked into place. Ask someone to hold it for you, if possible. Clearly mark and make sure that you can see: Any grab bars or handrails. First and last  steps. Where the edge of each step is. Use tools that help you move around (mobility aids) if they are needed. These include: Canes. Walkers. Scooters. Crutches. Turn on the lights when you go into a dark area. Replace any light bulbs as soon as they burn out. Set up your furniture so you have a clear path. Avoid moving your furniture around. If any of your floors are uneven, fix them. If there are any pets around you, be aware of where they are. Review your medicines with your doctor. Some medicines can make you feel dizzy. This can increase your chance of falling. Ask your doctor what other things that you can do to help prevent falls. This information is not intended to replace advice given to you by your health care provider. Make sure you discuss any questions you have with your health care provider. Document Released: 06/09/2009 Document Revised: 01/19/2016 Document Reviewed: 09/17/2014 Elsevier Interactive Patient Education  2017 [employer].

## 2021-09-29 NOTE — Assessment & Plan Note (Signed)
Counseling on options including lifestyle and will now pursue GLP1 therapy she can check with insurance due to Pre Diabetes diagnosis and obesity we will initiate sample and rx when she is ready

## 2021-09-29 NOTE — Assessment & Plan Note (Signed)
Prior A1c 5.6 to 5.9 now pursue GLP1 therapy she can check with insurance due to Pre Diabetes diagnosis and obesity we will initiate sample and rx when she is ready

## 2021-09-29 NOTE — Assessment & Plan Note (Signed)
Due for lipid panel, return next week On statin therapy

## 2021-09-29 NOTE — Patient Instructions (Addendum)
Thank you for coming to the office today.  Shingrix 2 doses 2-6 months apart, now free through medicare at the pharmacy  Recommend updated pneumonia vaccine at pharmacy PCV-20 or Prevnar-20 vaccine.  Call insurance find cost and coverage of the following - check the following: - Drug Tier, Preferred List, On Formulary - All will require a "Prior Authorization" from [place] first, before you can find out the cost - Find out if there is "Step Therapy" (other medicines required before you can try these)  Once you pick the one you want to try, let me know - we can get a sample ready IF we have it in stock. Then try it - and before running out of medicine, contact me back to order your Rx so we have time to get it processed.  For Pre-Diabetes / Diabetes   1. Ozempic (Semaglutide injection) - start 0.25mg  weekly for 4 weeks then increase to 0.5mg  weekly, sample is 6 doses, re-use the same pen until empty, new needle each dose.   2. Trulicity (Dulaglutide) - once weekly 0.75 (likely we would start) and 1.5 max dose, sample is 2 doses, 1 dose per pen, each pen is a one time use, no visible needle, it is an auto-injector.  ---------- 3 benefits - 1 significantly reduced A1c sugar, and may be able to reduce or stop metformin in future - 2 reduced appetite and weight loss with good results - 3 cardiovascular risk reduction, less likely to have heart attack/stroke     Please schedule a Follow-up Appointment to: Return in about 4 months (around 01/27/2022) for 4 month follow-up DM A1c med Weight.  If you have any other questions or concerns, please feel free to call the office or send a message through MyChart. You may also schedule an earlier appointment if necessary.  Additionally, you may be receiving a survey about your experience at our office within a few days to 1 week by e-mail or mail. We value your feedback.  Saralyn Pilar, DO [facility], [place]

## 2021-09-29 NOTE — Progress Notes (Signed)
Subjective:   Morgan Mcclain is a 71 y.o. female who presents for Medicare Annual (Subsequent) preventive examination.  Review of Systems           Objective:    Today's Vitals   09/29/21 0922  BP: 134/76  Pulse: 61  Temp: 98.2 F (36.8 C)  TempSrc: Oral  SpO2: 99%  Weight: 247 lb 3.2 oz (112.1 kg)  Height: 5\' 7"  (1.702 m)   Body mass index is 38.72 kg/m.  Advanced Directives 09/06/2020 09/01/2019 08/05/2018 12/05/2017 09/16/2017 07/30/2017 05/18/2016  Does Patient Have a Medical Advance Directive? Yes Yes Yes Yes Yes Yes No  Type of 05/20/2016 of [place];Living will Living will;Healthcare Power of [place] Power of [place];Living will - - Healthcare Power of [place];Living will -  Does patient want to make changes to medical advance directive? - - - - No - Patient declined - -  Copy of Healthcare Power of Attorney in Chart? No - copy requested No - copy requested No - copy requested - - No - copy requested -  Would patient like information on creating a medical advance directive? - - - - - - Yes - Educational materials given  Pre-existing out of facility DNR order (yellow form or pink MOST form) - - - - - - -    Current Medications (verified) Outpatient Encounter Medications as of 09/29/2021  Medication Sig   Cholecalciferol (VITAMIN D3) 50 MCG (2000 UT) TABS Take 2,000 Units by mouth daily.   hydrocortisone 2.5 % cream Apply to affected areas 1-2 times daily as needed for itch.   ketoconazole (NIZORAL) 2 % cream Apply 1-2 times daily to body folds prn flares.   rosuvastatin (CRESTOR) 10 MG tablet Take 1 tablet (10 mg total) by mouth at bedtime. (Patient not taking: No sig reported)   TREMFYA 100 MG/ML SOSY Inject 100 mg subcutaneously every 8 weeks as directed.   vitamin B-12 (CYANOCOBALAMIN) 1000 MCG tablet Take 1,000 mcg by mouth daily.   No facility-administered encounter medications on file as of 09/29/2021.    Allergies  (verified) Lamisil [terbinafine] and Shellfish allergy   History: Past Medical History:  Diagnosis Date   Arthritis    Left ureteral calculus    Psoriasis SEVERE--  RECEIVES LASE TX'S TWICE WEEKLY   Tremfya   Renal calculus, left    Sleep apnea 02/23/2015   No longer on CPAP   Past Surgical History:  Procedure Laterality Date   ABDOMINAL HYSTERECTOMY  1994   W/ UNILATERAL SALPINGOOPHORECTOMY   BREAST BIOPSY Right 07/14/2019   Affirm Bx- Coil clip-    FIBROADENOMATOID CHANGE    BREAST BIOPSY Left 07/14/2019   Affirm Bx- X Clip-   FIBROADENOMATOID CHANGE    COLONOSCOPY WITH PROPOFOL N/A 03/04/2015   Procedure: COLONOSCOPY WITH PROPOFOL;  Surgeon: 05/05/2015, MD;  [facility];  Service: Endoscopy;  Laterality: N/A;   CYSTOSCOPY WITH RETROGRADE PYELOGRAM, URETEROSCOPY AND STENT PLACEMENT Left 12/25/2012   Procedure: CYSTOSCOPY WITH RETROGRADE PYELOGRAM, URETEROSCOPY AND STENT PLACEMENT;  Surgeon: 02/24/2013, MD;  Location: [facility];  Service: Urology;  Laterality: Left;  90 MIN NEEDS DIG URETEROSCOPE    HOLMIUM LASER APPLICATION Left 12/25/2012   Procedure: HOLMIUM LASER APPLICATION;  Surgeon: 02/24/2013, MD;  Location: [facility];  Service: Urology;  Laterality: Left;   TONSILLECTOMY  AGE 76   Family History  Problem Relation Age of Onset   Heart failure Mother  Arthritis Mother    Heart attack Mother [id number]       Secondary CVA   Cancer Father        lung cancer   Heart disease Sister [id number]   Breast cancer Neg Hx    Social History   Socioeconomic History   Marital status: Married    Spouse name: Not on file   Number of children: 2   Years of education: Administrative Degree 6 yr   Highest education level: Master's degree (e.g., MA, MS, MEng, MEd, MSW, MBA)  Occupational History   Occupation: Retired - Programmer, systemsducator  Tobacco Use   Smoking status: Never   Smokeless tobacco: Never  Vaping Use   Vaping Use: Never used   Substance and Sexual Activity   Alcohol use: No   Drug use: No   Sexual activity: Not on file  Other Topics Concern   Not on file  Social History Narrative   Not on file   Social Determinants of Health   Financial Resource Strain: Not on file  Food Insecurity: Not on file  Transportation Needs: Not on file  Physical Activity: Not on file  Stress: Not on file  Social Connections: Not on file    Tobacco Counseling Counseling given: Not Answered   Clinical Intake:  Pre-visit preparation completed: Yes  Pain : No/denies pain     Nutritional Status: BMI > 30  Obese Nutritional Risks: None Diabetes: No  How often do you need to have someone help you when you read instructions, pamphlets, or other written materials from your doctor or pharmacy?: 1 - Never  Diabetic?no  Interpreter Needed?: No  Information entered by :: Kennedy BuckerLorrie Kimeka Badour, LPN   Activities of Daily Living No flowsheet data found.  Patient Care Team: Smitty CordsKaramalegos, Alexander J, DO as PCP - General (Family Medicine) Willeen NieceStewart, Tara, MD (Dermatology) Dayna BarkerBehalal-Bock, Christele, MD as Referring Physician (Internal Medicine)  Indicate any recent Medical Services you may have received from other than [facility] providers in the past year (date may be approximate).     Assessment:   This is a routine wellness examination for Morgan Hashimotoatricia.  Hearing/Vision screen No results found.  Dietary issues and exercise activities discussed:     Goals Addressed   None    Depression Screen PHQ 2/9 Scores 01/25/2021 09/06/2020 08/09/2020 11/09/2019 09/01/2019 08/11/2019 02/09/2019  PHQ - 2 Score 0 0 0 0 0 0 0  PHQ- 9 Score 2 - - - - - -    Fall Risk Fall Risk  09/06/2020 08/09/2020 11/09/2019 09/01/2019 08/11/2019  Falls in the past year? 1 0 0 0 0  Comment missed a step - - - -  Number falls in past yr: 0 0 0 0 0  Injury with Fall? 0 0 0 0 -  Risk for fall due to : Medication side effect - - - -  Follow up Falls evaluation  completed;Education provided;Falls prevention discussed Falls evaluation completed Falls evaluation completed - Falls evaluation completed    FALL RISK PREVENTION PERTAINING TO THE HOME:  Any stairs in or around the home? Yes  If so, are there any without handrails? No  Home free of loose throw rugs in walkways, pet beds, electrical cords, etc? Yes  Adequate lighting in your home to reduce risk of falls? Yes   ASSISTIVE DEVICES UTILIZED TO PREVENT FALLS:  Life alert? No  Use of a cane, walker or w/c? Yes  Grab bars in the bathroom? No  Shower chair or bench in shower?  No  Elevated toilet seat or a handicapped toilet? Yes   TIMED UP AND GO:  Was the test performed? Yes .  Length of time to ambulate 10 feet: 4 sec.   Gait slow and steady without use of assistive device  Cognitive Function:     6CIT Screen 09/06/2020 08/05/2018 07/30/2017  What Year? 0 points 0 points 0 points  What month? 0 points 0 points 0 points  What time? 0 points 0 points 0 points  Count back from 20 0 points 0 points 0 points  Months in reverse 0 points 0 points 0 points  Repeat phrase 2 points 0 points 2 points  Total Score 2 0 2    Immunizations Immunization History  Administered Date(s) Administered   Fluad Quad(high Dose 65+) 06/12/2019   Influenza, High Dose Seasonal PF 07/27/2017, 07/29/2018   Influenza-Unspecified 04/27/2016, 07/27/2017, 07/16/2020   PFIZER(Purple Top)SARS-COV-2 Vaccination 09/18/2019, 10/10/2019, 07/04/2020   Pneumococcal Conjugate-13 07/31/2017   Tdap 08/27/2009   Zoster, Live 08/27/2013    TDAP status: Due, Education has been provided regarding the importance of this vaccine. Advised may receive this vaccine at local pharmacy or Health Dept. Aware to provide a copy of the vaccination record if obtained from local pharmacy or Health Dept. Verbalized acceptance and understanding.  Flu Vaccine status: Up to date  Pneumococcal vaccine status: Up to date  Covid-19  vaccine status: Completed vaccines  Qualifies for Shingles Vaccine? Yes   Zostavax completed Yes   Shingrix Completed?: No.    Education has been provided regarding the importance of this vaccine. Patient has been advised to call insurance company to determine out of pocket expense if they have not yet received this vaccine. Advised may also receive vaccine at local pharmacy or Health Dept. Verbalized acceptance and understanding.  Screening Tests Health Maintenance  Topic Date Due   Zoster Vaccines- Shingrix (1 of 2) Never done   Pneumonia Vaccine 84+ Years old (2 - PPSV23 if available, else PCV20) 07/31/2018   COVID-19 Vaccine (4 - Booster for Pfizer series) 08/29/2020   INFLUENZA VACCINE  03/27/2021   TETANUS/TDAP  06/01/2026 (Originally 08/27/2021)   DEXA SCAN  08/27/2023   MAMMOGRAM  09/29/2023   COLONOSCOPY (Pts 45-32yrs Insurance coverage will need to be confirmed)  03/03/2025   Hepatitis C Screening  Completed   HPV VACCINES  Aged Out    Health Maintenance  Health Maintenance Due  Topic Date Due   Zoster Vaccines- Shingrix (1 of 2) Never done   Pneumonia Vaccine 21+ Years old (2 - PPSV23 if available, else PCV20) 07/31/2018   COVID-19 Vaccine (4 - Booster for Pfizer series) 08/29/2020   INFLUENZA VACCINE  03/27/2021    Colorectal cancer screening: Type of screening: Colonoscopy. Completed 03/04/15. Repeat every 10 years  Mammogram status: Completed 09/28/20. Repeat every year  Bone Density status: Completed 08/26/13. Results reflect: Bone density results: OSTEOPENIA. Repeat every 5 years.  Lung Cancer Screening: (Low Dose CT Chest recommended if Age 44-80 years, 30 pack-year currently smoking OR have quit w/in 15years.) does not qualify.   Additional Screening:  Hepatitis C Screening: does qualify; Completed 06/04/16  Vision Screening: Recommended annual ophthalmology exams for early detection of glaucoma and other disorders of the eye. Is the patient up to date with  their annual eye exam?  Yes  Who is the provider or what is the name of the office in which the patient attends annual eye exams? [facility] If pt is not established with a  provider, would they like to be referred to a provider to establish care? No .   Dental Screening: Recommended annual dental exams for proper oral hygiene  Community Resource Referral / Chronic Care Management: CRR required this visit?  No   CCM required this visit?  No      Plan:     I have personally reviewed and noted the following in the patients chart:   Medical and social history Use of alcohol, tobacco or illicit drugs  Current medications and supplements including opioid prescriptions.  Functional ability and status Nutritional status Physical activity Advanced directives List of other physicians Hospitalizations, surgeries, and ER visits in previous 12 months Vitals Screenings to include cognitive, depression, and falls Referrals and appointments  In addition, I have reviewed and discussed with patient certain preventive protocols, quality metrics, and best practice recommendations. A written personalized care plan for preventive services as well as general preventive health recommendations were provided to patient.     Hal HopeLorrie S Amya Hlad, LPN   1/6/10962/10/2021   Nurse Notes: none

## 2021-09-29 NOTE — Assessment & Plan Note (Signed)
Controlled BP 

## 2021-09-29 NOTE — Progress Notes (Signed)
Subjective:    Patient ID: Morgan Mcclain, female    DOB: March 25, 1951, 71 y.o.   MRN: [id number]  Morgan Mcclain is a 71 y.o. female presenting on 09/29/2021 for Annual Exam  Here for Annual Physical and Lab Review. Has seen Hal Hope, LPN  today for her AMW   HPI  Pre-HTN No recent home BP readings, she has previously done Home BP readings have been normal. Not taking HCTZ regularly - has discontinued this med. Controlled edema LE on compression and diuretic PRN Denies CP, dyspnea, HA, dizziness / lightheadedness   Pre-Diabetes / HYPERLIPIDEMIA / MORBID OBESITY BMI >38 Last result A1c 5.6 improved prior 5.7 to 5.9 range, similar to prior results in mild Pre-DM Last lab lipid LDL down from 140 to 103 and TC 200 to 163 on statin now. Tolerating well, taking Rosuvastatin 10mg  daily Interested in medication for this Lifestyle Weight elevated still without significant loss - Diet: Still improving diet, reduced portion size not always adhering - Exercise: goal to start more regular walking and exercise still Denies hypoglycemia   Psoriatic Arthritis / OA/DJD Followed by Dermatology on medication management.   Health Maintenance: Will consider Shingrix upgraded vaccine at pharmacy (previously had Zostavax in past) Will pursue PCV20 prevnar20 in future at pharmacy when ready, had Prevnar13 at age 8 but no other dose.  UTD Colonoscopy 2016, 10 year repeat, next in 2026  UTD Mammogram completed 09/28/21   Depression screen [facility] 2/9 09/29/2021 01/25/2021 09/06/2020  Decreased Interest 0 0 0  Down, Depressed, Hopeless 0 0 0  PHQ - 2 Score 0 0 0  Altered sleeping - 0 -  Tired, decreased energy - 1 -  Change in appetite - 1 -  Feeling bad or failure about yourself  - 0 -  Trouble concentrating - 0 -  Moving slowly or fidgety/restless - 0 -  Suicidal thoughts - 0 -  PHQ-9 Score - 2 -  Difficult doing work/chores - Not difficult at all -    Past Medical History:  Diagnosis  Date   Arthritis    Left ureteral calculus    Psoriasis SEVERE--  RECEIVES LASE TX'S TWICE WEEKLY   Tremfya   Renal calculus, left    Sleep apnea 02/23/2015   No longer on CPAP   Past Surgical History:  Procedure Laterality Date   ABDOMINAL HYSTERECTOMY  1994   W/ UNILATERAL SALPINGOOPHORECTOMY   BREAST BIOPSY Right 07/14/2019   Affirm Bx- Coil clip-    FIBROADENOMATOID CHANGE    BREAST BIOPSY Left 07/14/2019   Affirm Bx- X Clip-   FIBROADENOMATOID CHANGE    COLONOSCOPY WITH PROPOFOL N/A 03/04/2015   Procedure: COLONOSCOPY WITH PROPOFOL;  Surgeon: Midge Minium, MD;  [facility];  Service: Endoscopy;  Laterality: N/A;   CYSTOSCOPY WITH RETROGRADE PYELOGRAM, URETEROSCOPY AND STENT PLACEMENT Left 12/25/2012   Procedure: CYSTOSCOPY WITH RETROGRADE PYELOGRAM, URETEROSCOPY AND STENT PLACEMENT;  Surgeon: Sebastian Ache, MD;  Location: [facility];  Service: Urology;  Laterality: Left;  90 MIN NEEDS DIG URETEROSCOPE    HOLMIUM LASER APPLICATION Left 12/25/2012   Procedure: HOLMIUM LASER APPLICATION;  Surgeon: Sebastian Ache, MD;  Location[facility];  Service: Urology;  Laterality: Left;   TONSILLECTOMY  AGE 52   Social History   Socioeconomic History   Marital status: Married    Spouse name: Not on file   Number of children: 2   Years of education: Administrative Degree 6 yr  Highest education level: Master's degree (e.g., MA, MS, MEng, MEd, MSW, MBA)  Occupational History   Occupation: Retired Biomedical scientist  Tobacco Use   Smoking status: Never   Smokeless tobacco: Never  Vaping Use   Vaping Use: Never used  Substance and Sexual Activity   Alcohol use: No   Drug use: No   Sexual activity: Not on file  Other Topics Concern   Not on file  Social History Narrative   Not on file   Social Determinants of Health   Financial Resource Strain: Low Risk    Difficulty of Paying Living Expenses: Not hard at all  Food Insecurity: No Food  Insecurity   Worried About Charity fundraiser in the Last Year: Never true   [place] in the Last Year: Never true  Transportation Needs: No Transportation Needs   Lack of Transportation (Medical): No   Lack of Transportation (Non-Medical): No  Physical Activity: Inactive   Days of Exercise per Week: 0 days   Minutes of Exercise per Session: 0 min  Stress: No Stress Concern Present   Feeling of Stress : Only a little  Social Connections: Engineer, building services of Communication with Friends and Family: More than three times a week   Frequency of Social Gatherings with Friends and Family: More than three times a week   Attends Religious Services: More than 4 times per year   Active Member of [employer] or Organizations: Yes   Attends Music therapist: More than 4 times per year   Marital Status: Married  Human resources officer Violence: Not At Risk   Fear of Current or Ex-Partner: No   Emotionally Abused: No   Physically Abused: No   Sexually Abused: No   Family History  Problem Relation Age of Onset   Heart failure Mother    Arthritis Mother    Heart attack Mother 70       Secondary CVA   Cancer Father        lung cancer   Heart disease Sister 16   Breast cancer Neg Hx    Current Outpatient Medications on File Prior to Visit  Medication Sig   Cholecalciferol (VITAMIN D3) 50 MCG (2000 UT) TABS Take 2,000 Units by mouth daily.   hydrocortisone 2.5 % cream Apply to affected areas 1-2 times daily as needed for itch.   ketoconazole (NIZORAL) 2 % cream Apply 1-2 times daily to body folds prn flares.   TREMFYA 100 MG/ML SOSY Inject 100 mg subcutaneously every 8 weeks as directed.   vitamin B-12 (CYANOCOBALAMIN) 1000 MCG tablet Take 1,000 mcg by mouth daily.   rosuvastatin (CRESTOR) 10 MG tablet Take 1 tablet (10 mg total) by mouth at bedtime. (Patient not taking: Reported on 09/06/2020)   No current facility-administered medications on file prior to visit.     Review of Systems  Constitutional:  Negative for activity change, appetite change, chills, diaphoresis, fatigue and fever.  HENT:  Negative for congestion and hearing loss.   Eyes:  Negative for visual disturbance.  Respiratory:  Negative for cough, chest tightness, shortness of breath and wheezing.   Cardiovascular:  Negative for chest pain, palpitations and leg swelling.  Gastrointestinal:  Negative for abdominal pain, constipation, diarrhea, nausea and vomiting.  Genitourinary:  Negative for dysuria, frequency and hematuria.  Musculoskeletal:  Negative for arthralgias and neck pain.  Skin:  Negative for rash.  Neurological:  Negative for dizziness, weakness, light-headedness, numbness and headaches.  Hematological:  Negative for adenopathy.  Psychiatric/Behavioral:  Negative for behavioral problems, dysphoric mood and sleep disturbance.   Per HPI unless specifically indicated above      Objective:    BP 134/76    Pulse 61    Ht 5\' 7"  (1.702 m)    Wt 247 lb (112 kg)    SpO2 99%    BMI 38.69 kg/m   Wt Readings from Last 3 Encounters:  09/29/21 247 lb (112 kg)  09/29/21 247 lb 3.2 oz (112.1 kg)  01/25/21 246 lb 6.4 oz (111.8 kg)    Physical Exam Vitals and nursing note reviewed.  Constitutional:      General: She is not in acute distress.    Appearance: She is well-developed. She is obese. She is not diaphoretic.     Comments: Well-appearing, comfortable, cooperative  HENT:     Head: Normocephalic and atraumatic.  Eyes:     General:        Right eye: No discharge.        Left eye: No discharge.     Conjunctiva/sclera: Conjunctivae normal.     Pupils: Pupils are equal, round, and reactive to light.  Neck:     Thyroid: No thyromegaly.  Cardiovascular:     Rate and Rhythm: Normal rate and regular rhythm.     Pulses: Normal pulses.     Heart sounds: Normal heart sounds. No murmur heard. Pulmonary:     Effort: Pulmonary effort is normal. No respiratory distress.      Breath sounds: Normal breath sounds. No wheezing or rales.  Abdominal:     General: Bowel sounds are normal. There is no distension.     Palpations: Abdomen is soft. There is no mass.     Tenderness: There is no abdominal tenderness.  Musculoskeletal:        General: No tenderness. Normal range of motion.     Cervical back: Normal range of motion and neck supple.     Comments: Upper / Lower Extremities: - Normal muscle tone, strength bilateral upper extremities 5/5, lower extremities 5/5  Lymphadenopathy:     Cervical: No cervical adenopathy.  Skin:    General: Skin is warm and dry.     Findings: No erythema or rash.  Neurological:     Mental Status: She is alert and oriented to person, place, and time.     Comments: Distal sensation intact to light touch all extremities  Psychiatric:        Mood and Affect: Mood normal.        Behavior: Behavior normal.        Thought Content: Thought content normal.     Comments: Well groomed, good eye contact, normal speech and thoughts    I have personally reviewed the radiology report from 09/28/21 Mammogram.  CLINICAL DATA:  Screening.   EXAM: DIGITAL SCREENING BILATERAL MAMMOGRAM WITH TOMOSYNTHESIS AND CAD   TECHNIQUE: Bilateral screening digital craniocaudal and mediolateral oblique mammograms were obtained. Bilateral screening digital breast tomosynthesis was performed. The images were evaluated with computer-aided detection.   COMPARISON:  Previous exam(s).   ACR Breast Density Category b: There are scattered areas of fibroglandular density.   FINDINGS: There are no findings suspicious for malignancy.   IMPRESSION: No mammographic evidence of malignancy. A result letter of this screening mammogram will be mailed directly to the patient.   RECOMMENDATION: Screening mammogram in one year. (Code:SM-B-01Y)   BI-RADS CATEGORY  1: Negative.     Electronically Signed  By: Ammie Ferrier M.D.   On: 09/28/2021  10:18  Results for orders placed or performed in visit on 01/25/21  HgB A1c  Result Value Ref Range   Hgb A1c MFr Bld 5.6 <5.7 % of total Hgb   Mean Plasma Glucose 114 mg/dL   eAG (mmol/L) 6.3 mmol/L      Assessment & Plan:   Problem List Items Addressed This Visit     Psoriasis with arthropathy ([place])    Followed by Dermatology/Rheumatology On biologic agent currently      Relevant Orders   COMPLETE METABOLIC PANEL WITH GFR   CBC with Differential/Platelet   Pre-hypertension    Controlled BP       Relevant Orders   COMPLETE METABOLIC PANEL WITH GFR   CBC with Differential/Platelet   Pre-diabetes    Prior A1c 5.6 to 5.9 now pursue GLP1 therapy she can check with insurance due to Pre Diabetes diagnosis and obesity we will initiate sample and rx when she is ready      Relevant Orders   Hemoglobin A1c   Morbid obesity ([place])    Counseling on options including lifestyle and will now pursue GLP1 therapy she can check with insurance due to Pre Diabetes diagnosis and obesity we will initiate sample and rx when she is ready      Relevant Orders   COMPLETE METABOLIC PANEL WITH GFR   Lipid panel   Hyperlipidemia    Due for lipid panel, return next week On statin therapy      Relevant Orders   COMPLETE METABOLIC PANEL WITH GFR   Lipid panel   TSH   Other Visit Diagnoses     Annual physical exam    -  Primary   Relevant Orders   COMPLETE METABOLIC PANEL WITH GFR   Lipid panel   CBC with Differential/Platelet   Hemoglobin A1c   TSH       Updated Health Maintenance information Return 1 week for fasting lab only Encouraged improvement to lifestyle with diet and exercise Goal of weight loss   No orders of the defined types were placed in this encounter.     Follow up plan: Return in about 4 months (around 01/27/2022) for 1 week fasting lab 2/9, then 4 month follow-up DM A1c med Weight.  Nobie Putnam, [facility] 09/29/2021, 10:51 AM

## 2021-09-29 NOTE — Assessment & Plan Note (Signed)
Followed by Dermatology/Rheumatology On biologic agent currently 

## 2021-10-03 ENCOUNTER — Other Ambulatory Visit: Payer: Medicare PPO

## 2021-10-03 DIAGNOSIS — E782 Mixed hyperlipidemia: Secondary | ICD-10-CM

## 2021-10-03 DIAGNOSIS — R7303 Prediabetes: Secondary | ICD-10-CM | POA: Diagnosis not present

## 2021-10-03 DIAGNOSIS — L4050 Arthropathic psoriasis, unspecified: Secondary | ICD-10-CM | POA: Diagnosis not present

## 2021-10-03 DIAGNOSIS — Z0000 Encounter for general adult medical examination without abnormal findings: Secondary | ICD-10-CM

## 2021-10-03 DIAGNOSIS — R030 Elevated blood-pressure reading, without diagnosis of hypertension: Secondary | ICD-10-CM

## 2021-10-04 LAB — CBC WITH DIFFERENTIAL/PLATELET
Absolute Monocytes: 590 cells/uL (ref 200–950)
Basophils Absolute: 32 cells/uL (ref 0–200)
Basophils Relative: 0.7 %
Eosinophils Absolute: 99 cells/uL (ref 15–500)
Eosinophils Relative: 2.2 %
HCT: 42 % (ref 35.0–45.0)
Hemoglobin: 14.1 g/dL (ref 11.7–15.5)
Lymphs Abs: 1373 cells/uL (ref 850–3900)
MCH: 31.8 pg (ref 27.0–33.0)
MCHC: 33.6 g/dL (ref 32.0–36.0)
MCV: 94.6 fL (ref 80.0–100.0)
MPV: 11.1 fL (ref 7.5–12.5)
Monocytes Relative: 13.1 %
Neutro Abs: 2408 cells/uL (ref 1500–7800)
Neutrophils Relative %: 53.5 %
Platelets: 141 10*3/uL (ref 140–400)
RBC: 4.44 10*6/uL (ref 3.80–5.10)
RDW: 12.4 % (ref 11.0–15.0)
Total Lymphocyte: 30.5 %
WBC: 4.5 10*3/uL (ref 3.8–10.8)

## 2021-10-04 LAB — COMPLETE METABOLIC PANEL WITH GFR
AG Ratio: 1.1 (calc) (ref 1.0–2.5)
ALT: 7 U/L (ref 6–29)
AST: 17 U/L (ref 10–35)
Albumin: 3.9 g/dL (ref 3.6–5.1)
Alkaline phosphatase (APISO): 96 U/L (ref 37–153)
BUN: 9 mg/dL (ref 7–25)
CO2: 28 mmol/L (ref 20–32)
Calcium: 9.4 mg/dL (ref 8.6–10.4)
Chloride: 104 mmol/L (ref 98–110)
Creat: 0.73 mg/dL (ref 0.60–1.00)
Globulin: 3.6 g/dL (calc) (ref 1.9–3.7)
Glucose, Bld: 87 mg/dL (ref 65–99)
Potassium: 4.1 mmol/L (ref 3.5–5.3)
Sodium: 138 mmol/L (ref 135–146)
Total Bilirubin: 0.5 mg/dL (ref 0.2–1.2)
Total Protein: 7.5 g/dL (ref 6.1–8.1)
eGFR: 88 mL/min/{1.73_m2} (ref >=60)

## 2021-10-04 LAB — HEMOGLOBIN A1C
Hgb A1c MFr Bld: 5.8 % of total Hgb — ABNORMAL HIGH (ref <5.7)
Mean Plasma Glucose: 120 mg/dL
eAG (mmol/L): 6.6 mmol/L

## 2021-10-04 LAB — LIPID PANEL
Cholesterol: 207 mg/dL — ABNORMAL HIGH (ref <200)
HDL: 47 mg/dL — ABNORMAL LOW (ref >=50)
LDL Cholesterol (Calc): 139 mg/dL (calc) — ABNORMAL HIGH
Non-HDL Cholesterol (Calc): 160 mg/dL (calc) — ABNORMAL HIGH (ref <130)
Total CHOL/HDL Ratio: 4.4 (calc) (ref <5.0)
Triglycerides: 97 mg/dL (ref <150)

## 2021-10-04 LAB — TSH: TSH: 1.25 mIU/L (ref 0.40–4.50)

## 2021-10-10 ENCOUNTER — Other Ambulatory Visit: Payer: Self-pay

## 2021-10-10 ENCOUNTER — Ambulatory Visit: Payer: Medicare PPO | Admitting: Dermatology

## 2021-10-10 DIAGNOSIS — L409 Psoriasis, unspecified: Secondary | ICD-10-CM | POA: Diagnosis not present

## 2021-10-10 DIAGNOSIS — Z79899 Other long term (current) drug therapy: Secondary | ICD-10-CM | POA: Diagnosis not present

## 2021-10-10 MED ORDER — GUSELKUMAB 100 MG/ML ~~LOC~~ SOSY
100.0000 mg | PREFILLED_SYRINGE | Freq: Once | SUBCUTANEOUS | Status: AC
  Administered 2021-10-10: 100 mg via SUBCUTANEOUS

## 2021-10-10 NOTE — Patient Instructions (Addendum)
Reviewed risks of biologics including immunosuppression, infections, injection site reaction, and failure to improve condition. Goal is control of skin condition, not cure.  Some older biologics such as Humira and Enbrel may slightly increase risk of malignancy and may worsen congestive heart failure. The use of biologics requires long term medication management, including periodic office visits and monitoring of blood work. ° °If You Need Anything After Your Visit ° °If you have any questions or concerns for your doctor, please call our main line at [phone] and press option 4 to reach your doctor's medical assistant. If no one answers, please leave a voicemail as directed and we will return your call as soon as possible. Messages left after 4 pm will be answered the following business day.  ° °You may also send us a message via MyChart. We typically respond to MyChart messages within 1-2 business days. ° °For prescription refills, please ask your pharmacy to contact our office. Our fax number is [phone]. ° °If you have an urgent issue when the clinic is closed that cannot wait until the next business day, you can page your doctor at the number below.   ° °Please note that while we do our best to be available for urgent issues outside of office hours, we are not available 24/7.  ° °If you have an urgent issue and are unable to reach us, you may choose to seek medical care at your doctor's office, retail clinic, urgent care center, or emergency room. ° °If you have a medical emergency, please immediately call 911 or go to the emergency department. ° °Pager Numbers ° °- Dr. Kowalski: [phone] ° °- Dr. Moye: [phone] ° °- Dr. Stewart: [phone] ° °In the event of inclement weather, please call our main line at [phone] for an update on the status of any delays or closures. ° °Dermatology Medication Tips: °Please keep the boxes that topical medications come in in order to help keep track of the  instructions about where and how to use these. Pharmacies typically print the medication instructions only on the boxes and not directly on the medication tubes.  ° °If your medication is too expensive, please contact our office at [phone] option 4 or send us a message through MyChart.  ° °We are unable to tell what your co-pay for medications will be in advance as this is different depending on your insurance coverage. However, we may be able to find a substitute medication at lower cost or fill out paperwork to get insurance to cover a needed medication.  ° °If a prior authorization is required to get your medication covered by your insurance company, please allow us 1-2 business days to complete this process. ° °Drug prices often vary depending on where the prescription is filled and some pharmacies may offer cheaper prices. ° °The website www.goodrx.com contains coupons for medications through different pharmacies. The prices here do not account for what the cost may be with help from insurance (it may be cheaper with your insurance), but the website can give you the price if you did not use any insurance.  °- You can print the associated coupon and take it with your prescription to the pharmacy.  °- You may also stop by our office during regular business hours and pick up a GoodRx coupon card.  °- If you need your prescription sent electronically to a different pharmacy, notify our office through [facility] MyChart or by phone at [phone] option 4. ° ° ° ° °Si   Usted Necesita Algo Después de Su Visita ° °También puede enviarnos un mensaje a través de MyChart. Por lo general respondemos a los mensajes de MyChart en el transcurso de 1 a 2 días hábiles. ° °Para renovar recetas, por favor pida a su farmacia que se ponga en contacto con nuestra oficina. Nuestro número de fax es el [phone]. ° °Si tiene un asunto urgente cuando la clínica esté cerrada y que no puede esperar hasta el siguiente día hábil,  puede llamar/localizar a su doctor(a) al número que aparece a continuación.  ° °Por favor, tenga en cuenta que aunque hacemos todo lo posible para estar disponibles para asuntos urgentes fuera del horario de oficina, no estamos disponibles las 24 horas del día, los 7 días de la semana.  ° °Si tiene un problema urgente y no puede comunicarse con nosotros, puede optar por buscar atención médica  en el consultorio de su doctor(a), en una clínica privada, en un centro de atención urgente o en una sala de emergencias. ° °Si tiene una emergencia médica, por favor llame inmediatamente al 911 o vaya a la sala de emergencias. ° °Números de bíper ° °- Dr. Kowalski: [phone] ° °- Dra. Moye: [phone] ° °- Dra. Stewart: [phone] ° °En caso de inclemencias del tiempo, por favor llame a nuestra línea principal al [phone] para una actualización sobre el estado de cualquier retraso o cierre. ° °Consejos para la medicación en dermatología: °Por favor, guarde las cajas en las que vienen los medicamentos de uso tópico para ayudarle a seguir las instrucciones sobre dónde y cómo usarlos. Las farmacias generalmente imprimen las instrucciones del medicamento sólo en las cajas y no directamente en los tubos del medicamento.  ° °Si su medicamento es muy caro, por favor, póngase en contacto con nuestra oficina llamando al [phone] y presione la opción 4 o envíenos un mensaje a través de MyChart.  ° °No podemos decirle cuál será su copago por los medicamentos por adelantado ya que esto es diferente dependiendo de la cobertura de su seguro. Sin embargo, es posible que podamos encontrar un medicamento sustituto a menor costo o llenar un formulario para que el seguro cubra el medicamento que se considera necesario.  ° °Si se requiere una autorización previa para que su compañía de seguros cubra su medicamento, por favor permítanos de 1 a 2 días hábiles para completar este proceso. ° °Los precios de los medicamentos varían con  frecuencia dependiendo del lugar de dónde se surte la receta y alguna farmacias pueden ofrecer precios más baratos. ° °El sitio web www.goodrx.com tiene cupones para medicamentos de diferentes farmacias. Los precios aquí no tienen en cuenta lo que podría costar con la ayuda del seguro (puede ser más barato con su seguro), pero el sitio web puede darle el precio si no utilizó ningún seguro.  °- Puede imprimir el cupón correspondiente y llevarlo con su receta a la farmacia.  °- También puede pasar por nuestra oficina durante el horario de atención regular y recoger una tarjeta de cupones de GoodRx.  °- Si necesita que su receta se envíe electrónicamente a una farmacia diferente, informe a nuestra oficina a través de MyChart de [place] o por teléfono llamando al [phone] y presione la opción 4. ° °

## 2021-10-10 NOTE — Progress Notes (Signed)
Follow-Up Visit   Subjective  Morgan Mcclain Morgan Mcclain is a 71 y.o. female who presents for the following: Follow-up (Patient here today for psoriasis follow up. Patient states she is staying clear while on Tremfya. She is injected every 2 months. She reports no active areas today at exam. ).  The following portions of the chart were reviewed this encounter and updated as appropriate:      Review of Systems: No other skin Mcclain systemic complaints except as noted in HPI Mcclain Assessment and Plan.   Objective  Well appearing patient in no apparent distress; mood and affect are within normal limits.  A focused examination was performed including face and bilateral inframammary. Relevant physical exam findings are noted in the Assessment and Plan.  b/l inframammary, face Clear at exam    Assessment & Plan  Psoriasis b/l inframammary, face  Chronic condition with duration Mcclain expected duration over one year. Currently well-controlled.   Psoriasis - severe on systemic biologic treatment injections.  Psoriasis is a chronic non-curable, but treatable genetic/hereditary disease that may have other systemic features affecting other organ systems such as joints (Psoriatic Arthritis).  It is linked with heart disease, inflammatory bowel disease, non-alcoholic fatty liver disease, and depression. Significant skin psoriasis and/Mcclain psoriatic arthritis may have significant symptoms and affects activities of daily activity and often benefits from systemic biologic injection treatments.  These biologic treatments have some potential side effects including immunosuppression and require pre-treatment laboratory screening and periodic laboratory monitoring and periodic in person evaluation and monitoring by the attending dermatologist physician (long term medication management).   Well-controlled on Tremfya, continue q 2 mos Reviewed 10/03/21 CMP and CBC - normal results Pending TB Gold will send  refills  Continue ketoconazole 2% cream qd/bid to body folds Continue HC 2.5% cream qd/bid to aas body folds prn flares   Psoriasis - severe on systemic biologic treatment injections.  Psoriasis is a chronic non-curable, but treatable genetic/hereditary disease that may have other systemic features affecting other organ systems such as joints (Psoriatic Arthritis).  It is linked with heart disease, inflammatory bowel disease, non-alcoholic fatty liver disease, and depression. Significant skin psoriasis and/Mcclain psoriatic arthritis may have significant symptoms and affects activities of daily activity and often benefits from systemic biologic injection treatments.  These biologic treatments have some potential side effects including immunosuppression and require pre-treatment laboratory screening and periodic laboratory monitoring and periodic in person evaluation and monitoring by the attending dermatologist physician.      Tremfya injected today into left lower abdomen. Pt tolerated well.  Lot: [id number] Exp: 01/26/2023   Reviewed risks of biologics including immunosuppression, infections, injection site reaction, and failure to improve condition. Goal is control of skin condition, not cure.  Some older biologics such as Humira and Enbrel may slightly increase risk of malignancy and may worsen congestive heart failure. The use of biologics requires long term medication management, including periodic office visits and monitoring of blood work.    Guselkumab SOSY 100 mg - b/l inframammary, face   QuantiFERON-TB Gold Plus - b/l inframammary, face  Related Medications ketoconazole (NIZORAL) 2 % cream Apply 1-2 times daily to body folds prn flares.  hydrocortisone 2.5 % cream Apply to affected areas 1-2 times daily as needed for itch.  TREMFYA 100 MG/ML SOSY Inject 100 mg subcutaneously every 8 weeks as directed.   Return for 2 month nurse visit , 2 month nurse visit , 6 month psoriasis  . Morgan Mcclain, CMA, am  acting as scribe for Brendolyn Patty, MD.  Documentation: I have reviewed the above documentation for accuracy and completeness, and I agree with the above.  Brendolyn Patty MD

## 2021-10-14 LAB — QUANTIFERON-TB GOLD PLUS
QuantiFERON Mitogen Value: >10 IU/mL
QuantiFERON Nil Value: 0 IU/mL
QuantiFERON TB1 Ag Value: 0.01 IU/mL
QuantiFERON TB2 Ag Value: 0 IU/mL
QuantiFERON-TB Gold Plus: NEGATIVE

## 2021-10-16 ENCOUNTER — Telehealth: Payer: Self-pay

## 2021-10-16 DIAGNOSIS — L409 Psoriasis, unspecified: Secondary | ICD-10-CM

## 2021-10-16 MED ORDER — TREMFYA 100 MG/ML ~~LOC~~ SOSY: PREFILLED_SYRINGE | SUBCUTANEOUS | 2 refills | Status: DC

## 2021-10-16 NOTE — Telephone Encounter (Signed)
-----   Message from Willeen Niece, MD sent at 10/16/2021  2:05 PM EST ----- TB negative, send in 6 mo tremfya rfs - please call patient

## 2021-10-16 NOTE — Telephone Encounter (Signed)
Advised pt of lab results.  Advised pt I sent in refills of Tremfya to [facility]

## 2021-12-11 ENCOUNTER — Ambulatory Visit (INDEPENDENT_AMBULATORY_CARE_PROVIDER_SITE_OTHER): Payer: Medicare PPO | Admitting: Dermatology

## 2021-12-11 DIAGNOSIS — L400 Psoriasis vulgaris: Secondary | ICD-10-CM | POA: Diagnosis not present

## 2021-12-11 MED ORDER — GUSELKUMAB 100 MG/ML ~~LOC~~ SOSY
100.0000 mg | PREFILLED_SYRINGE | Freq: Once | SUBCUTANEOUS | Status: AC
  Administered 2021-12-11: 100 mg via SUBCUTANEOUS

## 2021-12-11 NOTE — Progress Notes (Signed)
Patient here today for 8 week Tremfya injection for Psoriasis Vulgaris. ?Tremfya 100mg /mL injected into right lower abdomen. Patient tolerated well. ? ?LOT: [id number] ?EXP: 01/2023  ? ?02/2023, RMA ? ?Documentation: I have reviewed the above documentation for accuracy and completeness, and I agree with the above. ? ?Dorathy Daft MD  ?

## 2022-01-30 ENCOUNTER — Telehealth: Payer: Self-pay

## 2022-01-30 NOTE — Telephone Encounter (Signed)
Patient called because she is not scheduled for a 8 week Tremfya injection. She is due next week based off her last injection.  Called all numbers listed on patient's account but no answer and unable to leave VM. aw

## 2022-02-01 ENCOUNTER — Telehealth: Payer: Self-pay

## 2022-02-01 NOTE — Telephone Encounter (Signed)
Patient called and left VM this afternoon that she still has not heard back from our office from Tuesday. Per my last phone call note, I did return her call to several different numbers.  Annice Pih tried to call the patient this AM but no answer regarding contacting [facility].  I called patient this PM and left VM on home number and tried calling cell listed but the number goes straight to VM and the VM is full.

## 2022-02-12 ENCOUNTER — Ambulatory Visit (INDEPENDENT_AMBULATORY_CARE_PROVIDER_SITE_OTHER): Payer: Medicare PPO | Admitting: Dermatology

## 2022-02-12 DIAGNOSIS — L400 Psoriasis vulgaris: Secondary | ICD-10-CM | POA: Diagnosis not present

## 2022-02-12 MED ORDER — GUSELKUMAB 100 MG/ML ~~LOC~~ SOSY
100.0000 mg | PREFILLED_SYRINGE | Freq: Once | SUBCUTANEOUS | Status: AC
  Administered 2022-02-12: 100 mg via SUBCUTANEOUS

## 2022-02-12 NOTE — Progress Notes (Signed)
Patient here today for 8 week Tremfya injection for Psoriasis Vulgaris.   Tremfya 100mg  injected into left lower abdomen. Patient tolerated well.  LOT: [id number] EXP: 04/2023  05/2023, RMA  Documentation: I have reviewed the above documentation for accuracy and completeness, and I agree with the above.  Dorathy Daft MD

## 2022-03-15 DIAGNOSIS — E785 Hyperlipidemia, unspecified: Secondary | ICD-10-CM | POA: Diagnosis not present

## 2022-03-15 DIAGNOSIS — R030 Elevated blood-pressure reading, without diagnosis of hypertension: Secondary | ICD-10-CM | POA: Diagnosis not present

## 2022-03-15 DIAGNOSIS — M1990 Unspecified osteoarthritis, unspecified site: Secondary | ICD-10-CM | POA: Diagnosis not present

## 2022-03-15 DIAGNOSIS — Z6838 Body mass index (BMI) 38.0-38.9, adult: Secondary | ICD-10-CM | POA: Diagnosis not present

## 2022-03-15 DIAGNOSIS — R32 Unspecified urinary incontinence: Secondary | ICD-10-CM | POA: Diagnosis not present

## 2022-03-15 DIAGNOSIS — L4050 Arthropathic psoriasis, unspecified: Secondary | ICD-10-CM | POA: Diagnosis not present

## 2022-03-15 DIAGNOSIS — Z809 Family history of malignant neoplasm, unspecified: Secondary | ICD-10-CM | POA: Diagnosis not present

## 2022-03-15 DIAGNOSIS — D84821 Immunodeficiency due to drugs: Secondary | ICD-10-CM | POA: Diagnosis not present

## 2022-04-23 ENCOUNTER — Ambulatory Visit: Payer: Medicare PPO | Admitting: Dermatology

## 2022-05-01 ENCOUNTER — Ambulatory Visit: Payer: Medicare PPO | Admitting: Dermatology

## 2022-05-01 DIAGNOSIS — L409 Psoriasis, unspecified: Secondary | ICD-10-CM | POA: Diagnosis not present

## 2022-05-01 DIAGNOSIS — Z79899 Other long term (current) drug therapy: Secondary | ICD-10-CM | POA: Diagnosis not present

## 2022-05-01 MED ORDER — TREMFYA 100 MG/ML ~~LOC~~ SOSY: PREFILLED_SYRINGE | SUBCUTANEOUS | 2 refills | Status: DC

## 2022-05-01 MED ORDER — GUSELKUMAB 100 MG/ML ~~LOC~~ SOSY
100.0000 mg | PREFILLED_SYRINGE | Freq: Once | SUBCUTANEOUS | Status: AC
  Administered 2022-05-01: 100 mg via SUBCUTANEOUS

## 2022-05-01 NOTE — Patient Instructions (Signed)
Due to recent changes in healthcare laws, you may see results of your pathology and/or laboratory studies on MyChart before the doctors have had a chance to review them. We understand that in some cases there may be results that are confusing or concerning to you. Please understand that not all results are received at the same time and often the doctors may need to interpret multiple results in order to provide you with the best plan of care or course of treatment. Therefore, we ask that you please give us 2 business days to thoroughly review all your results before contacting the office for clarification. Should we see a critical lab result, you will be contacted sooner.   If You Need Anything After Your Visit  If you have any questions or concerns for your doctor, please call our main line at [phone] and press option 4 to reach your doctor's medical assistant. If no one answers, please leave a voicemail as directed and we will return your call as soon as possible. Messages left after 4 pm will be answered the following business day.   You may also send us a message via MyChart. We typically respond to MyChart messages within 1-2 business days.  For prescription refills, please ask your pharmacy to contact our office. Our fax number is [phone].  If you have an urgent issue when the clinic is closed that cannot wait until the next business day, you can page your doctor at the number below.    Please note that while we do our best to be available for urgent issues outside of office hours, we are not available 24/7.   If you have an urgent issue and are unable to reach us, you may choose to seek medical care at your doctor's office, retail clinic, urgent care center, or emergency room.  If you have a medical emergency, please immediately call 911 or go to the emergency department.  Pager Numbers  - Dr. Kowalski: [phone]  - Dr. Moye: [phone]  - Dr. Stewart:  [phone]  In the event of inclement weather, please call our main line at [phone] for an update on the status of any delays or closures.  Dermatology Medication Tips: Please keep the boxes that topical medications come in in order to help keep track of the instructions about where and how to use these. Pharmacies typically print the medication instructions only on the boxes and not directly on the medication tubes.   If your medication is too expensive, please contact our office at [phone] option 4 or send us a message through MyChart.   We are unable to tell what your co-pay for medications will be in advance as this is different depending on your insurance coverage. However, we may be able to find a substitute medication at lower cost or fill out paperwork to get insurance to cover a needed medication.   If a prior authorization is required to get your medication covered by your insurance company, please allow us 1-2 business days to complete this process.  Drug prices often vary depending on where the prescription is filled and some pharmacies may offer cheaper prices.  The website www.goodrx.com contains coupons for medications through different pharmacies. The prices here do not account for what the cost may be with help from insurance (it may be cheaper with your insurance), but the website can give you the price if you did not use any insurance.  - You can print the associated coupon and take it with   your prescription to the pharmacy.  - You may also stop by our office during regular business hours and pick up a GoodRx coupon card.  - If you need your prescription sent electronically to a different pharmacy, notify our office through [facility] MyChart or by phone at [phone] option 4.     Si Usted Necesita Algo Despus de Su Visita  Tambin puede enviarnos un mensaje a travs de MyChart. Por lo general respondemos a los mensajes de MyChart en el transcurso de 1 a 2  das hbiles.  Para renovar recetas, por favor pida a su farmacia que se ponga en contacto con nuestra oficina. Nuestro nmero de fax es el [phone].  Si tiene un asunto urgente cuando la clnica est cerrada y que no puede esperar hasta el siguiente da hbil, puede llamar/localizar a su doctor(a) al nmero que aparece a continuacin.   Por favor, tenga en cuenta que aunque hacemos todo lo posible para estar disponibles para asuntos urgentes fuera del horario de oficina, no estamos disponibles las 24 horas del da, los 7 das de la semana.   Si tiene un problema urgente y no puede comunicarse con nosotros, puede optar por buscar atencin mdica  en el consultorio de su doctor(a), en una clnica privada, en un centro de atencin urgente o en una sala de emergencias.  Si tiene una emergencia mdica, por favor llame inmediatamente al 911 o vaya a la sala de emergencias.  Nmeros de bper  - Dr. Kowalski: [phone]  - Dra. Moye: [phone]  - Dra. Stewart: [phone]  En caso de inclemencias del tiempo, por favor llame a nuestra lnea principal al [phone] para una actualizacin sobre el estado de cualquier retraso o cierre.  Consejos para la medicacin en dermatologa: Por favor, guarde las cajas en las que vienen los medicamentos de uso tpico para ayudarle a seguir las instrucciones sobre dnde y cmo usarlos. Las farmacias generalmente imprimen las instrucciones del medicamento slo en las cajas y no directamente en los tubos del medicamento.   Si su medicamento es muy caro, por favor, pngase en contacto con nuestra oficina llamando al [phone] y presione la opcin 4 o envenos un mensaje a travs de MyChart.   No podemos decirle cul ser su copago por los medicamentos por adelantado ya que esto es diferente dependiendo de la cobertura de su seguro. Sin embargo, es posible que podamos encontrar un medicamento sustituto a menor costo o llenar un formulario para que el  seguro cubra el medicamento que se considera necesario.   Si se requiere una autorizacin previa para que su compaa de seguros cubra su medicamento, por favor permtanos de 1 a 2 das hbiles para completar este proceso.  Los precios de los medicamentos varan con frecuencia dependiendo del lugar de dnde se surte la receta y alguna farmacias pueden ofrecer precios ms baratos.  El sitio web www.goodrx.com tiene cupones para medicamentos de diferentes farmacias. Los precios aqu no tienen en cuenta lo que podra costar con la ayuda del seguro (puede ser ms barato con su seguro), pero el sitio web puede darle el precio si no utiliz ningn seguro.  - Puede imprimir el cupn correspondiente y llevarlo con su receta a la farmacia.  - Tambin puede pasar por nuestra oficina durante el horario de atencin regular y recoger una tarjeta de cupones de GoodRx.  - Si necesita que su receta se enve electrnicamente a una farmacia diferente, informe a nuestra oficina a travs de MyChart de [facility]   o por telfono llamando al [phone] y presione la opcin 4.  

## 2022-05-01 NOTE — Progress Notes (Signed)
   Follow-Up Visit   Subjective  Morgan Mcclain is a 71 y.o. female who presents for the following: Psoriasis (Trunk, extremities, Tremfya sq injections q 8 wks, Ketoconazole 2% cr prn, HC 2.5% c cr prn, no side effects from Tremfya). Psoriasis is well-controlled, no flares.  Skin clear today.   The following portions of the chart were reviewed this encounter and updated as appropriate:       Review of Systems:  No other skin or systemic complaints except as noted in HPI or Assessment and Plan.  Objective  Well appearing patient in no apparent distress; mood and affect are within normal limits.  A focused examination was performed including arms, trunk. Relevant physical exam findings are noted in the Assessment and Plan.  trunk, extremities Hyperpigmentation inframammary fold/upper abdomen    Assessment & Plan  Psoriasis trunk, extremities  Chronic condition with duration or expected duration over one year. Currently well-controlled.   Psoriasis - severe on systemic "biologic" treatment injections.  Psoriasis is a chronic non-curable, but treatable genetic/hereditary disease that may have other systemic features affecting other organ systems such as joints (Psoriatic Arthritis).  It is linked with heart disease, inflammatory bowel disease, non-alcoholic fatty liver disease, and depression. Significant skin psoriasis and/or psoriatic arthritis may have significant symptoms and affects activities of daily activity and often benefits from systemic "biologic" injection treatments.  These "biologic" treatments have some potential side effects including immunosuppression and require pre-treatment laboratory screening and periodic laboratory monitoring and periodic in person evaluation and monitoring by the attending dermatologist physician (long term medication management).   TB negative from 10/10/2021  Cont Tremfya 100mg /ml sq injections q 8 wks Tremfya 100mg /ml sq injection  today to R abdomen, Lot .AP EXP 07/2023 Cont Ketoconazole 2% cr qd prn flares Cont HC 2.5% cr qd up to 5d/wk prn flares  Topical steroids (such as triamcinolone, fluocinolone, fluocinonide, mometasone, clobetasol, halobetasol, betamethasone, hydrocortisone) can cause thinning and lightening of the skin if they are used for too long in the same area. Your physician has selected the right strength medicine for your problem and area affected on the body. Please use your medication only as directed by your physician to prevent side effects.   Reviewed risks of biologics including immunosuppression, infections, injection site reaction, and failure to improve condition. Goal is control of skin condition, not cure.  Some older biologics such as Humira and Enbrel may slightly increase risk of malignancy and may worsen congestive heart failure.  Talz and Cosentyx may cause inflammatory bowel disease to flare. The use of biologics requires long term medication management, including periodic office visits and monitoring of blood work.    Guselkumab SOSY 100 mg - trunk, extremities   Related Medications ketoconazole (NIZORAL) 2 % cream Apply 1-2 times daily to body folds prn flares.  hydrocortisone 2.5 % cream Apply to affected areas 1-2 times daily as needed for itch.  TREMFYA 100 MG/ML SOSY Inject 100 mg subcutaneously every 8 weeks as directed.   Return in about 6 months (around 10/30/2022) for Psoriasis f/u, 8wks with nurse for Tremfya injection.  I, 08/2023, RMA, am acting as scribe for 12/30/2022, Mcclain .  Documentation: I have reviewed the above documentation for accuracy and completeness, and I agree with the above.  Morgan Mcclain

## 2022-05-21 ENCOUNTER — Encounter: Payer: Self-pay | Admitting: Family Medicine

## 2022-05-21 ENCOUNTER — Ambulatory Visit: Payer: Medicare PPO | Admitting: Family Medicine

## 2022-05-21 ENCOUNTER — Ambulatory Visit: Payer: Medicare PPO | Admitting: Physician Assistant

## 2022-05-21 VITALS — BP 149/71 | HR 64 | Ht 67.0 in | Wt 242.0 lb

## 2022-05-21 DIAGNOSIS — M549 Dorsalgia, unspecified: Secondary | ICD-10-CM | POA: Diagnosis not present

## 2022-05-21 DIAGNOSIS — M62830 Muscle spasm of back: Secondary | ICD-10-CM

## 2022-05-21 DIAGNOSIS — M542 Cervicalgia: Secondary | ICD-10-CM | POA: Diagnosis not present

## 2022-05-21 DIAGNOSIS — M62838 Other muscle spasm: Secondary | ICD-10-CM

## 2022-05-21 DIAGNOSIS — G8929 Other chronic pain: Secondary | ICD-10-CM

## 2022-05-21 MED ORDER — BACLOFEN 10 MG PO TABS: 5.0000 mg | ORAL_TABLET | Freq: Two times a day (BID) | ORAL | 2 refills | Status: DC | PRN
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
## 2022-05-21 NOTE — Patient Instructions (Addendum)
Thank you for coming to the office today.  START anti inflammatory topical - OTC Voltaren (generic Diclofenac) topical 2-4 times a day as needed for pain swelling of affected joint for 1-2 weeks or longer.  Start taking Baclofen (Lioresal) 10mg  (muscle relaxant) - start with half (cut) to one whole pill at night as needed for next 1-3 nights (may make you drowsy, caution with driving) see how it affects you, then if tolerated increase to one pill 2 to 3 times a day or (every 8 hours as needed)  Recommend to start taking Tylenol Extra Strength 500mg  tabs - take 1 to 2 tabs per dose (max 1000mg ) every 6-8 hours for pain (take regularly, don't skip a dose for next 7 days), max 24 hour daily dose is 6 tablets or 3000mg . In the future you can repeat the same everyday Tylenol course for 1-2 weeks at a time.   Safe to take some ibuprofen if you need, but prefer to limit that. ------------------------------------   [address] [place], [place] [phone]  [address], [place], [address] [phone]  Please schedule a Follow-up Appointment to: Return if symptoms worsen or fail to improve.  If you have any other questions or concerns, please feel free to call the office or send a message through MyChart. You may also schedule an earlier appointment if necessary.  Additionally, you may be receiving a survey about your experience at our office within a few days to 1 week by e-mail or mail. We value your feedback.  [place], DO [facility], [facility]             Low Back Pain Exercises  See other page with pictures of each exercise.  Start with 1 or 2 of these exercises that you are most comfortable with. Do not do any exercises that cause you significant worsening pain. Some of these may cause some "stretching soreness" but it should go away after you stop the exercise, and get better over time. Gradually increase up  to 3-4 exercises as tolerated.  Standing hamstring stretch: Place the heel of your leg on a stool about 15 inches high. Keep your knee straight. Lean forward, bending at the hips until you feel a mild stretch in the back of your thigh. Make sure you do not roll your shoulders and bend at the waist when doing this or you will stretch your lower back instead. Hold the stretch for 15 to 30 seconds. Repeat 3 times. Repeat the same stretch on your other leg.  Cat and camel: Get down on your hands and knees. Let your stomach sag, allowing your back to curve downward. Hold this position for 5 seconds. Then arch your back and hold for 5 seconds. Do 3 sets of 10.  Quadriped Arm/Leg Raises: Get down on your hands and knees. Tighten your abdominal muscles to stiffen your spine. While keeping your abdominals tight, raise one arm and the opposite leg away from you. Hold this position for 5 seconds. Lower your arm and leg slowly and alternate sides. Do this 10 times on each side.  Pelvic tilt: Lie on your back with your knees bent and your feet flat on the floor. Tighten your abdominal muscles and push your lower back into the floor. Hold this position for 5 seconds, then relax. Do 3 sets of 10.  Partial curl: Lie on your back with your knees bent and your feet flat on the floor.  Tighten your stomach muscles and flatten your back against the floor. Tuck your chin to your chest. With your hands stretched out in front of you, curl your upper body forward until your shoulders clear the floor. Hold this position for 3 seconds. Don't hold your breath. It helps to breathe out as you lift your shoulders up. Relax. Repeat 10 times. Build to 3 sets of 10. To challenge yourself, clasp your hands behind your head and keep your elbows out to the side.  Lower trunk rotation: Lie on your back with your knees bent and your feet flat on the floor. Tighten your abdominal muscles and push your lower back into the floor. Keeping your  shoulders down flat, gently rotate your legs to one side, then the other as far as you can. Repeat 10 to 20 times.  Single knee to chest stretch: Lie on your back with your legs straight out in front of you. Bring one knee up to your chest and grasp the back of your thigh. Pull your knee toward your chest, stretching your buttock muscle. Hold this position for 15 to 30 seconds and return to the starting position. Repeat 3 times on each side.  Double knee to chest: Lie on your back with your knees bent and your feet flat on the floor. Tighten your abdominal muscles and push your lower back into the floor. Pull both knees up to your chest. Hold for 5 seconds and repeat 10 to 20 times.   -------------------------  Cervical Strain and Sprain Rehab Ask your health care provider which exercises are safe for you. Do exercises exactly as told by your health care provider and adjust them as directed. It is normal to feel mild stretching, pulling, tightness, or discomfort as you do these exercises. Stop right away if you feel sudden pain or your pain gets worse. Do not begin these exercises until told by your health care provider. Stretching and range-of-motion exercises Cervical side bending  Using good posture, sit on a stable chair or stand up. Without moving your shoulders, slowly tilt your left / right ear to your shoulder until you feel a stretch in the opposite side neck muscles. You should be looking straight ahead. Hold for __________ seconds. Repeat with the other side of your neck. Repeat __________ times. Complete this exercise __________ times a day. Cervical rotation  Using good posture, sit on a stable chair or stand up. Slowly turn your head to the side as if you are looking over your left / right shoulder. Keep your eyes level with the ground. Stop when you feel a stretch along the side and the back of your neck. Hold for __________ seconds. Repeat this by turning to your other  side. Repeat __________ times. Complete this exercise __________ times a day. Thoracic extension and pectoral stretch  Roll a towel or a small blanket so it is about 4 inches (10 cm) in diameter. Lie down on your back on a firm surface. Put the towel in the middle of your back across your spine. It should not be under your shoulder blades. Put your hands behind your head and let your elbows fall out to your sides. Hold for __________ seconds. Repeat __________ times. Complete this exercise __________ times a day. Strengthening exercises Isometric upper cervical flexion  Lie on your back with a thin pillow behind your head and a small rolled-up towel under your neck. Gently tuck your chin toward your chest and nod your head down to look  toward your feet. Do not lift your head off the pillow. Hold for __________ seconds. Release the tension slowly. Relax your neck muscles completely before you repeat this exercise. Repeat __________ times. Complete this exercise __________ times a day. Isometric cervical extension  Stand about 6 inches (15 cm) away from a wall, with your back facing the wall. Place a soft object, about 6-8 inches (15-20 cm) in diameter, between the back of your head and the wall. A soft object could be a small pillow, a ball, or a folded towel. Gently tilt your head back and press into the soft object. Keep your jaw and forehead relaxed. Hold for __________ seconds. Release the tension slowly. Relax your neck muscles completely before you repeat this exercise. Repeat __________ times. Complete this exercise __________ times a day. Posture and body mechanics Body mechanics refers to the movements and positions of your body while you do your daily activities. Posture is part of body mechanics. Good posture and healthy body mechanics can help to relieve stress in your body's tissues and joints. Good posture means that your spine is in its natural S-curve position (your spine is  neutral), your shoulders are pulled back slightly, and your head is not tipped forward. The following are general guidelines for applying improved posture and body mechanics to your everyday activities. Sitting  When sitting, keep your spine neutral and keep your feet flat on the floor. Use a footrest, if necessary, and keep your thighs parallel to the floor. Avoid rounding your shoulders, and avoid tilting your head forward. When working at a desk or a computer, keep your desk at a height where your hands are slightly lower than your elbows. Slide your chair under your desk so you are close enough to maintain good posture. When working at a computer, place your monitor at a height where you are looking straight ahead and you do not have to tilt your head forward or downward to look at the screen. Standing  When standing, keep your spine neutral and keep your feet about hip-width apart. Keep a slight bend in your knees. Your ears, shoulders, and hips should line up. When you do a task in which you stand in one place for a long time, place one foot up on a stable object that is 2-4 inches (5-10 cm) high, such as a footstool. This helps keep your spine neutral. Resting When lying down and resting, avoid positions that are most painful for you. Try to support your neck in a neutral position. You can use a contour pillow or a small rolled-up towel. Your pillow should support your neck but not push on it. This information is not intended to replace advice given to you by your health care provider. Make sure you discuss any questions you have with your health care provider. Document Revised: 07/03/2021 Document Reviewed: 07/03/2021 Elsevier Patient Education  [place].

## 2022-05-22 ENCOUNTER — Ambulatory Visit: Payer: Medicare PPO | Admitting: Family Medicine

## 2022-05-24 ENCOUNTER — Ambulatory Visit: Payer: Medicare PPO | Admitting: Family Medicine

## 2022-06-06 ENCOUNTER — Encounter: Payer: Self-pay | Admitting: Family Medicine

## 2022-07-02 ENCOUNTER — Ambulatory Visit (INDEPENDENT_AMBULATORY_CARE_PROVIDER_SITE_OTHER): Payer: Medicare PPO | Admitting: Dermatology

## 2022-07-02 ENCOUNTER — Telehealth: Payer: Self-pay

## 2022-07-02 DIAGNOSIS — L400 Psoriasis vulgaris: Secondary | ICD-10-CM

## 2022-07-02 MED ORDER — GUSELKUMAB 100 MG/ML ~~LOC~~ SOSY
100.0000 mg | PREFILLED_SYRINGE | Freq: Once | SUBCUTANEOUS | Status: AC
  Administered 2022-07-02: 100 mg via SUBCUTANEOUS

## 2022-07-02 NOTE — Telephone Encounter (Signed)
Pt called left message on voicemail she would like a sample of Tremyfa for her scheduled appt today because she have not received her tremyfa rx in the mail, called pt discussed we can give her a sample of Tremyfa at her appt today

## 2022-07-02 NOTE — Progress Notes (Signed)
Patient here today for 8 week Tremfya injection for Psoriasis Vulgaris.    Tremfya 100mg  injected into left lower abdomen. Patient tolerated well.   LOT: LLSOB.AE EXP: 06/2022  Patient's injection has not arrived. Sample used for today's appointment.    Johnsie Kindred, RMA     Documentation: I have reviewed the above documentation for accuracy and completeness, and I agree with the above.  Brendolyn Patty MD

## 2022-09-04 ENCOUNTER — Ambulatory Visit (INDEPENDENT_AMBULATORY_CARE_PROVIDER_SITE_OTHER): Payer: Medicare PPO

## 2022-09-04 DIAGNOSIS — L400 Psoriasis vulgaris: Secondary | ICD-10-CM

## 2022-09-04 MED ORDER — GUSELKUMAB 100 MG/ML ~~LOC~~ SOSY
100.0000 mg | PREFILLED_SYRINGE | Freq: Once | SUBCUTANEOUS | Status: AC
  Administered 2022-09-04: 100 mg via SUBCUTANEOUS

## 2022-09-04 NOTE — Progress Notes (Signed)
Patient here today for 8 week Tremfya injection for Psoriasis Vulgaris.    Tremfya 100mg  injected into right lower abdomen. Patient tolerated well.   LOT: MJS1Y.AE EXP: 04/2023   Patient's injection has not arrived. Sample used for today's appointment.    Johnsie Kindred, RMA

## 2022-09-12 DIAGNOSIS — M9902 Segmental and somatic dysfunction of thoracic region: Secondary | ICD-10-CM | POA: Diagnosis not present

## 2022-09-12 DIAGNOSIS — M9901 Segmental and somatic dysfunction of cervical region: Secondary | ICD-10-CM | POA: Diagnosis not present

## 2022-09-12 DIAGNOSIS — M542 Cervicalgia: Secondary | ICD-10-CM | POA: Diagnosis not present

## 2022-09-12 DIAGNOSIS — M546 Pain in thoracic spine: Secondary | ICD-10-CM | POA: Diagnosis not present

## 2022-09-18 ENCOUNTER — Encounter: Payer: Self-pay | Admitting: Family Medicine

## 2022-09-20 ENCOUNTER — Other Ambulatory Visit: Payer: Self-pay | Admitting: Family Medicine

## 2022-09-20 DIAGNOSIS — Z1231 Encounter for screening mammogram for malignant neoplasm of breast: Secondary | ICD-10-CM

## 2022-10-01 DIAGNOSIS — M542 Cervicalgia: Secondary | ICD-10-CM | POA: Diagnosis not present

## 2022-10-01 DIAGNOSIS — M546 Pain in thoracic spine: Secondary | ICD-10-CM | POA: Diagnosis not present

## 2022-10-01 DIAGNOSIS — M9902 Segmental and somatic dysfunction of thoracic region: Secondary | ICD-10-CM | POA: Diagnosis not present

## 2022-10-01 DIAGNOSIS — M9901 Segmental and somatic dysfunction of cervical region: Secondary | ICD-10-CM | POA: Diagnosis not present

## 2022-10-12 ENCOUNTER — Ambulatory Visit
Admission: RE | Admit: 2022-10-12 | Discharge: 2022-10-12 | Disposition: A | Discharge status: Home / Self Care | Payer: Medicare PPO | Source: Ambulatory Visit | Attending: Family Medicine | Admitting: Family Medicine

## 2022-10-12 DIAGNOSIS — Z1231 Encounter for screening mammogram for malignant neoplasm of breast: Secondary | ICD-10-CM | POA: Insufficient documentation

## 2022-10-16 ENCOUNTER — Other Ambulatory Visit: Payer: Medicare PPO

## 2022-10-19 ENCOUNTER — Encounter: Payer: Medicare PPO | Admitting: Family Medicine

## 2022-10-22 DIAGNOSIS — M9901 Segmental and somatic dysfunction of cervical region: Secondary | ICD-10-CM | POA: Diagnosis not present

## 2022-10-22 DIAGNOSIS — M542 Cervicalgia: Secondary | ICD-10-CM | POA: Diagnosis not present

## 2022-10-22 DIAGNOSIS — M546 Pain in thoracic spine: Secondary | ICD-10-CM | POA: Diagnosis not present

## 2022-10-22 DIAGNOSIS — M9902 Segmental and somatic dysfunction of thoracic region: Secondary | ICD-10-CM | POA: Diagnosis not present

## 2022-10-26 ENCOUNTER — Encounter: Payer: Medicare PPO | Admitting: Family Medicine

## 2022-10-29 ENCOUNTER — Ambulatory Visit (INDEPENDENT_AMBULATORY_CARE_PROVIDER_SITE_OTHER): Payer: Medicare PPO | Admitting: Dermatology

## 2022-10-29 ENCOUNTER — Other Ambulatory Visit: Payer: Self-pay

## 2022-10-29 ENCOUNTER — Other Ambulatory Visit: Payer: Medicare PPO

## 2022-10-29 DIAGNOSIS — R7303 Prediabetes: Secondary | ICD-10-CM | POA: Diagnosis not present

## 2022-10-29 DIAGNOSIS — E782 Mixed hyperlipidemia: Secondary | ICD-10-CM

## 2022-10-29 DIAGNOSIS — L409 Psoriasis, unspecified: Secondary | ICD-10-CM

## 2022-10-29 DIAGNOSIS — R030 Elevated blood-pressure reading, without diagnosis of hypertension: Secondary | ICD-10-CM | POA: Diagnosis not present

## 2022-10-29 MED ORDER — GUSELKUMAB 100 MG/ML ~~LOC~~ SOSY
100.0000 mg | PREFILLED_SYRINGE | Freq: Once | SUBCUTANEOUS | Status: AC
  Administered 2022-10-29: 100 mg via SUBCUTANEOUS

## 2022-10-29 NOTE — Patient Instructions (Signed)
Reviewed risks of biologics including immunosuppression, infections, injection site reaction, and failure to improve condition. Goal is control of skin condition, not cure.  Some older biologics such as Humira and Enbrel may slightly increase risk of malignancy and may worsen congestive heart failure.  Talz and Cosentyx may cause inflammatory bowel disease to flare. The use of biologics requires long term medication management, including periodic office visits and monitoring of blood work.   Due to recent changes in healthcare laws, you may see results of your pathology and/or laboratory studies on MyChart before the doctors have had a chance to review them. We understand that in some cases there may be results that are confusing or concerning to you. Please understand that not all results are received at the same time and often the doctors may need to interpret multiple results in order to provide you with the best plan of care or course of treatment. Therefore, we ask that you please give [place] 2 business days to thoroughly review all your results before contacting the office for clarification. Should we see a critical lab result, you will be contacted sooner.   If You Need Anything After Your Visit  If you have any questions or concerns for your doctor, please call our main line at [phone] and press option 4 to reach your doctor's medical assistant. If no one answers, please leave a voicemail as directed and we will return your call as soon as possible. Messages left after 4 pm will be answered the following business day.   You may also send [place] a message via [place]. We typically respond to MyChart messages within 1-2 business days.  For prescription refills, please ask your pharmacy to contact our office. Our fax number is [phone].  If you have an urgent issue when the clinic is closed that cannot wait until the next business day, you can page your doctor at the number below.    Please  note that while we do our best to be available for urgent issues outside of office hours, we are not available 24/7.   If you have an urgent issue and are unable to reach [place], you may choose to seek medical care at your doctor's office, retail clinic, urgent care center, or emergency room.  If you have a medical emergency, please immediately call 911 or go to the emergency department.  Pager Numbers  - Dr. Nehemiah Massed: [phone]  - Dr. Laurence Ferrari: [phone]  - Dr. Nicole Kindred: [phone]  In the event of inclement weather, please call our main line at [phone] for an update on the status of any delays or closures.  Dermatology Medication Tips: Please keep the boxes that topical medications come in in order to help keep track of the instructions about where and how to use these. Pharmacies typically print the medication instructions only on the boxes and not directly on the medication tubes.   If your medication is too expensive, please contact our office at [phone] option 4 or send [place] a message through [place].   We are unable to tell what your co-pay for medications will be in advance as this is different depending on your insurance coverage. However, we may be able to find a substitute medication at lower cost or fill out paperwork to get insurance to cover a needed medication.   If a prior authorization is required to get your medication covered by your insurance company, please allow [place] 1-2 business days to complete this process.  Drug prices often vary  depending on where the prescription is filled and some pharmacies may offer cheaper prices.  The website www.goodrx.com contains coupons for medications through different pharmacies. The prices here do not account for what the cost may be with help from insurance (it may be cheaper with your insurance), but the website can give you the price if you did not use any insurance.  - You can print the associated coupon and take it with  your prescription to the pharmacy.  - You may also stop by our office during regular business hours and pick up a GoodRx coupon card.  - If you need your prescription sent electronically to a different pharmacy, notify our office through [facility] or by phone at [phone] option 4.     Si Usted Necesita Algo Despus de Su Visita  Tambin puede enviarnos un mensaje a travs de Pharmacist, community. Por lo general respondemos a los mensajes de MyChart en el transcurso de 1 a 2 das hbiles.  Para renovar recetas, por favor pida a su farmacia que se ponga en contacto con nuestra oficina. Harland Dingwall de fax es [place] [phone].  Si tiene un asunto urgente cuando la clnica est cerrada y que no puede esperar hasta el siguiente da hbil, puede llamar/localizar a su doctor(a) al nmero que aparece a continuacin.   Por favor, tenga en cuenta que aunque hacemos todo lo posible para estar disponibles para asuntos urgentes fuera del horario de [place], no estamos disponibles las 24 horas del da, los 7 das de la [place].   Si tiene un problema urgente y no puede comunicarse con nosotros, puede optar por buscar atencin mdica  en el consultorio de su doctor(a), en una clnica privada, en un centro de atencin urgente o en una sala de emergencias.  Si tiene Engineering geologist, por favor llame inmediatamente al 911 o vaya a la sala de emergencias.  Nmeros de bper  - Dr. Nehemiah Massed: [phone]  - Dra. Moye: [phone]  - Dra. Nicole Kindred: [phone]  En caso de inclemencias del [place], por favor llame a Johnsie Kindred principal al [phone] para una actualizacin sobre el [place] de cualquier retraso o cierre.  Consejos para la medicacin en dermatologa: Por favor, guarde las cajas en las que vienen los medicamentos de uso tpico para ayudarle a seguir las instrucciones sobre dnde y cmo usarlos. Las farmacias generalmente imprimen las instrucciones del medicamento slo en las cajas y  no directamente en los tubos del [place].   Si su medicamento es muy caro, por favor, pngase en contacto con Zigmund Daniel llamando al [phone] y presione la opcin 4 o envenos un mensaje a travs de Pharmacist, community.   No podemos decirle cul ser su copago por los medicamentos por adelantado ya que esto es diferente dependiendo de la cobertura de su seguro. Sin embargo, es posible que podamos encontrar un medicamento sustituto a Electrical engineer un formulario para que el seguro cubra el medicamento que se considera necesario.   Si se requiere una autorizacin previa para que su compaa de seguros [place] su medicamento, por favor permtanos de 1 a 2 das hbiles para completar este proceso.  Los precios de los medicamentos varan con frecuencia dependiendo del Environmental consultant de dnde se surte la receta y alguna farmacias pueden ofrecer precios ms baratos.  El sitio web www.goodrx.com tiene cupones para medicamentos de Airline pilot. Los precios aqu no tienen en cuenta lo que podra costar con la ayuda del seguro (puede ser ms barato con su seguro),  pero el sitio web puede darle el precio si no Field seismologist.  - Puede imprimir el cupn correspondiente y llevarlo con su receta a la farmacia.  - Tambin puede pasar por nuestra oficina durante el horario de atencin regular y Charity fundraiser una tarjeta de cupones de GoodRx.  - Si necesita que su receta se enve electrnicamente a una farmacia diferente, informe a nuestra oficina a travs de MyChart de [place] o por telfono llamando al [phone] y presione la opcin 4.

## 2022-10-29 NOTE — Progress Notes (Signed)
Follow-Up Visit   Subjective  Morgan Mcclain is a 72 y.o. female who presents for the following: Follow-up.  Patient presents for 6 month follow-up psoriasis of the trunk, extremities. Tremfya sq injections q 8 wks (no side effects, no infection, no injection site reactions), Ketoconazole 2% cr prn, HC 2.5% c cr prn.  Psoriasis is well-controlled, mild pinkness of the right inframammary.   The following portions of the chart were reviewed this encounter and updated as appropriate:       Review of Systems:  No other skin or systemic complaints except as noted in HPI or Assessment and Plan.  Objective  Well appearing patient in no apparent distress; mood and affect are within normal limits.  A focused examination was performed including face, trunk. Relevant physical exam findings are noted in the Assessment and Plan.  inframammary Well demarcated violaceous patch on the right inframammary; general hyperpigmentation of the bilateral inframammary. Rest of body clear    Assessment & Plan  Psoriasis inframammary  Chronic condition with duration or expected duration over one year. Currently well-controlled on Tremfya  Counseling on psoriasis and coordination of care   Psoriasis - severe on systemic "biologic" treatment injections.  Psoriasis is a chronic non-curable, but treatable genetic/hereditary disease that may have other systemic features affecting other organ systems such as joints (Psoriatic Arthritis).  It is linked with heart disease, inflammatory bowel disease, non-alcoholic fatty liver disease, and depression. Significant skin psoriasis and/or psoriatic arthritis may have significant symptoms and affects activities of daily activity and often benefits from systemic "biologic" injection treatments.  These "biologic" treatments have some potential side effects including immunosuppression and require pre-treatment laboratory screening and periodic laboratory monitoring and periodic  in person evaluation and monitoring by the attending dermatologist physician (long term medication management).    Pending labs, continue Tremfya 100 MG/ML sq injections q 8 weeks. TB ordered. Patient had other labs this morning with her PCP.  Tremfya 100 MG/ML syringe injected into the left lower abdomen. Patient tolerated well. Lot [id number] Exp 01/2024 NDC [id number]  Start Zoryve Cream Apply to AA inframammary, samples given x 2. Lot [id number] Exp 10/2023. Cont Ketoconazole 2% cr qd prn flares Cont HC 2.5% cr qd up to 5d/wk prn flares   Topical steroids (such as triamcinolone, fluocinolone, fluocinonide, mometasone, clobetasol, halobetasol, betamethasone, hydrocortisone) can cause thinning and lightening of the skin if they are used for too long in the same area. Your physician has selected the right strength medicine for your problem and area affected on the body. Please use your medication only as directed by your physician to prevent side effects.    Reviewed risks of biologics including immunosuppression, infections, injection site reaction, and failure to improve condition. Goal is control of skin condition, not cure.  Some older biologics such as Humira and Enbrel may slightly increase risk of malignancy and may worsen congestive heart failure.  Talz and Cosentyx may cause inflammatory bowel disease to flare. The use of biologics requires long term medication management, including periodic office visits and monitoring of blood work.      Guselkumab SOSY 100 mg - inframammary   Related Procedures QuantiFERON-TB Gold Plus  Related Medications ketoconazole (NIZORAL) 2 % cream Apply 1-2 times daily to body folds prn flares.  hydrocortisone 2.5 % cream Apply to affected areas 1-2 times daily as needed for itch.  TREMFYA 100 MG/ML SOSY Inject 100 mg subcutaneously every 8 weeks as directed.   Return in about 6 months (around  05/01/2023) for Psoriasis. Every 2 months with nurse for  Tremfya injection.Lindi Adie, CMA, am acting as scribe for Brendolyn Patty, MD .  Documentation: I have reviewed the above documentation for accuracy and completeness, and I agree with the above.  Brendolyn Patty MD

## 2022-10-30 ENCOUNTER — Ambulatory Visit: Payer: Medicare PPO | Admitting: Dermatology

## 2022-10-30 ENCOUNTER — Other Ambulatory Visit: Payer: Medicare PPO

## 2022-10-30 LAB — HEMOGLOBIN A1C
Hgb A1c MFr Bld: 6 % of total Hgb — ABNORMAL HIGH (ref <5.7)
Mean Plasma Glucose: 126 mg/dL
eAG (mmol/L): 7 mmol/L

## 2022-10-30 LAB — LIPID PANEL
Cholesterol: 206 mg/dL — ABNORMAL HIGH (ref <200)
HDL: 47 mg/dL — ABNORMAL LOW (ref >=50)
LDL Cholesterol (Calc): 139 mg/dL (calc) — ABNORMAL HIGH
Non-HDL Cholesterol (Calc): 159 mg/dL (calc) — ABNORMAL HIGH (ref <130)
Total CHOL/HDL Ratio: 4.4 (calc) (ref <5.0)
Triglycerides: 101 mg/dL (ref <150)

## 2022-10-30 LAB — CBC WITH DIFFERENTIAL/PLATELET
Absolute Monocytes: 525 cells/uL (ref 200–950)
Basophils Absolute: 30 cells/uL (ref 0–200)
Basophils Relative: 0.6 %
Eosinophils Absolute: 90 cells/uL (ref 15–500)
Eosinophils Relative: 1.8 %
HCT: 42.7 % (ref 35.0–45.0)
Hemoglobin: 14.1 g/dL (ref 11.7–15.5)
Lymphs Abs: 1330 cells/uL (ref 850–3900)
MCH: 31.3 pg (ref 27.0–33.0)
MCHC: 33 g/dL (ref 32.0–36.0)
MCV: 94.7 fL (ref 80.0–100.0)
MPV: 11.4 fL (ref 7.5–12.5)
Monocytes Relative: 10.5 %
Neutro Abs: 3025 cells/uL (ref 1500–7800)
Neutrophils Relative %: 60.5 %
Platelets: 138 10*3/uL — ABNORMAL LOW (ref 140–400)
RBC: 4.51 10*6/uL (ref 3.80–5.10)
RDW: 12 % (ref 11.0–15.0)
Total Lymphocyte: 26.6 %
WBC: 5 10*3/uL (ref 3.8–10.8)

## 2022-10-30 LAB — COMPREHENSIVE METABOLIC PANEL
AG Ratio: 1.2 (calc) (ref 1.0–2.5)
ALT: 9 U/L (ref 6–29)
AST: 15 U/L (ref 10–35)
Albumin: 4.1 g/dL (ref 3.6–5.1)
Alkaline phosphatase (APISO): 100 U/L (ref 37–153)
BUN: 13 mg/dL (ref 7–25)
CO2: 27 mmol/L (ref 20–32)
Calcium: 9.3 mg/dL (ref 8.6–10.4)
Chloride: 105 mmol/L (ref 98–110)
Creat: 0.74 mg/dL (ref 0.60–1.00)
Globulin: 3.3 g/dL (calc) (ref 1.9–3.7)
Glucose, Bld: 91 mg/dL (ref 65–99)
Potassium: 4.4 mmol/L (ref 3.5–5.3)
Sodium: 140 mmol/L (ref 135–146)
Total Bilirubin: 0.5 mg/dL (ref 0.2–1.2)
Total Protein: 7.4 g/dL (ref 6.1–8.1)

## 2022-10-30 LAB — TSH: TSH: 1.16 mIU/L (ref 0.40–4.50)

## 2022-11-02 ENCOUNTER — Encounter: Payer: Self-pay | Admitting: Family Medicine

## 2022-11-02 ENCOUNTER — Ambulatory Visit (INDEPENDENT_AMBULATORY_CARE_PROVIDER_SITE_OTHER): Payer: Medicare PPO | Admitting: Family Medicine

## 2022-11-02 ENCOUNTER — Encounter: Payer: Medicare PPO | Admitting: Family Medicine

## 2022-11-02 ENCOUNTER — Ambulatory Visit (INDEPENDENT_AMBULATORY_CARE_PROVIDER_SITE_OTHER): Payer: Medicare PPO

## 2022-11-02 VITALS — BP 148/80 | Ht 67.0 in | Wt 239.8 lb

## 2022-11-02 VITALS — BP 138/84 | HR 64 | Ht 67.0 in | Wt 239.0 lb

## 2022-11-02 DIAGNOSIS — Z0000 Encounter for general adult medical examination without abnormal findings: Secondary | ICD-10-CM

## 2022-11-02 DIAGNOSIS — L4050 Arthropathic psoriasis, unspecified: Secondary | ICD-10-CM

## 2022-11-02 DIAGNOSIS — R030 Elevated blood-pressure reading, without diagnosis of hypertension: Secondary | ICD-10-CM | POA: Diagnosis not present

## 2022-11-02 DIAGNOSIS — E782 Mixed hyperlipidemia: Secondary | ICD-10-CM | POA: Diagnosis not present

## 2022-11-02 DIAGNOSIS — R7303 Prediabetes: Secondary | ICD-10-CM

## 2022-11-02 DIAGNOSIS — Z780 Asymptomatic menopausal state: Secondary | ICD-10-CM

## 2022-11-02 MED ORDER — ROSUVASTATIN CALCIUM 10 MG PO TABS: 10.0000 mg | ORAL_TABLET | Freq: Every day | ORAL | 3 refills | Status: DC

## 2022-11-02 NOTE — Patient Instructions (Addendum)
Thank you for coming to the office today.   For the shoulder try: START anti inflammatory topical - OTC Voltaren (generic Diclofenac) topical 2-4 times a day as needed for pain swelling of affected joint for 1-2 weeks or longer.  Future we can follow up on the shoulder and consider a joint injection (steroid shot)  Recommend Pneumonia vaccine, new updated version - Prevnar-20. Final dose you will need. Call or schedule a Nurse Visit for Pneumonia Shot.  Consider Shingles vaccine Shingrix 2 doses 2-6 month apart at the Pharmacy for better coverage.  Check with insurance on these vaccine.  ---------- Call insurance find cost and coverage of the following - check the following: - Drug Tier, Preferred List, On Formulary - All will require a "Prior Authorization" from [place] first, before you can find out the cost - Find out if there is "Step Therapy" (other medicines required before you can try these)  Once you pick the one you want to try, let me know - we can get a sample ready IF we have it in stock. Then try it - and before running out of medicine, contact me back to order your Rx so we have time to get it processed.  For Weight Loss / Obesity only  Wegovy (same as Ozempic) weekly injection - start 0.'25mg'$  weekly, 1 dose per pen, single use, auto-injector  2. Saxenda - DAILY injection - start 0.'6mg'$  injection DAILY, you can increase the dose by 1 notch or 0.6 mg per week, if you don't tolerate a dose, can reduce it the next day.  3. Zepbound (same as Mounjaro) weekly injection - BEST AVAILABLE. Best results for weight loss on a weekly shot.  4. Contrave - oral medication, appetite suppression has wellbutrin/bupropion and naltrexone in it and it can also help with appetite, it is ordered through a speciality pharmacy. - $99 per month mail order   Future make sure your insurance has weight loss coverage  WEIGHT MANAGEMENT  Dr Dennard Nip  [facility] [place], [place] [address] Ph: [phone]  DUE for FASTING BLOOD WORK (no food or drink after midnight before the lab appointment, only water or coffee without cream/sugar on the morning of)  SCHEDULE "Lab Only" visit in the morning at the clinic for lab draw in 1 YEAR  - Make sure Lab Only appointment is at about 1 week before your next appointment, so that results will be available  For Lab Results, once available within 2-3 days of blood draw, you can can log in to MyChart online to view your results and a brief explanation. Also, we can discuss results at next follow-up visit.    Please schedule a Follow-up Appointment to: Return in about 1 year (around 11/02/2023) for 1 year fasting lab only then 1 week later Annual Physical.  If you have any other questions or concerns, please feel free to call the office or send a message through [place]. You may also schedule an earlier appointment if necessary.  Additionally, you may be receiving a survey about your experience at our office within a few days to 1 week by e-mail or mail. We value your feedback.  Nobie Putnam, DO [place]

## 2022-11-02 NOTE — Patient Instructions (Signed)
Morgan Mcclain , Thank you for taking time to come for your Medicare Wellness Visit. I appreciate your ongoing commitment to your health goals. Please review the following plan we discussed and let me know if I can assist you in the future.   These are the goals we discussed:  Goals      DIET - EAT MORE FRUITS AND VEGETABLES     DIET - INCREASE WATER INTAKE     Recommend drinking at least 6-8 glasses of water a day      DIET - INCREASE WATER INTAKE     Patient Stated     09/06/2020, wants to get under 200 pounds        This is a list of the screening recommended for you and due dates:  Health Maintenance  Topic Date Due   Zoster (Shingles) Vaccine (1 of 2) Never done   Pneumonia Vaccine (2 of 2 - PPSV23 or PCV20) 07/31/2018   DTaP/Tdap/Td vaccine (2 - Td or Tdap) 08/28/2019   Flu Shot  03/27/2022   COVID-19 Vaccine (6 - 2023-24 season) 09/11/2022   DEXA scan (bone density measurement)  08/27/2023   Mammogram  10/13/2023   Medicare Annual Wellness Visit  11/02/2023   Colon Cancer Screening  03/03/2025   Hepatitis C Screening: USPSTF Recommendation to screen - Ages 18-79 yo.  Completed   HPV Vaccine  Aged Out    Advanced directives: no  Conditions/risks identified: none  Next appointment: Follow up in one year for your annual wellness visit 11/08/23 @ 8:45 am in person   Preventive Care 65 Years and Older, Female Preventive care refers to lifestyle choices and visits with your health care provider that can promote health and wellness. What does preventive care include? A yearly physical exam. This is also called an annual well check. Dental exams once or twice a year. Routine eye exams. Ask your health care provider how often you should have your eyes checked. Personal lifestyle choices, including: Daily care of your teeth and gums. Regular physical activity. Eating a healthy diet. Avoiding tobacco and drug use. Limiting alcohol use. Practicing safe sex. Taking low-dose  aspirin every day. Taking vitamin and mineral supplements as recommended by your health care provider. What happens during an annual well check? The services and screenings done by your health care provider during your annual well check will depend on your age, overall health, lifestyle risk factors, and family history of disease. Counseling  Your health care provider may ask you questions about your: Alcohol use. Tobacco use. Drug use. Emotional well-being. Home and relationship well-being. Sexual activity. Eating habits. History of falls. Memory and ability to understand (cognition). Work and work Statistician. Reproductive health. Screening  You may have the following tests or measurements: Height, weight, and BMI. Blood pressure. Lipid and cholesterol levels. These may be checked every 5 years, or more frequently if you are over 45 years old. Skin check. Lung cancer screening. You may have this screening every year starting at age 63 if you have a 30-pack-year history of smoking and currently smoke or have quit within the past 15 years. Fecal occult blood test (FOBT) of the stool. You may have this test every year starting at age 58. Flexible sigmoidoscopy or colonoscopy. You may have a sigmoidoscopy every 5 years or a colonoscopy every 10 years starting at age 21. Hepatitis C blood test. Hepatitis B blood test. Sexually transmitted disease (STD) testing. Diabetes screening. This is done by checking your blood sugar (  glucose) after you have not eaten for a while (fasting). You may have this done every 1-3 years. Bone density scan. This is done to screen for osteoporosis. You may have this done starting at age 58. Mammogram. This may be done every 1-2 years. Talk to your health care provider about how often you should have regular mammograms. Talk with your health care provider about your test results, treatment options, and if necessary, the need for more tests. Vaccines  Your  health care provider may recommend certain vaccines, such as: Influenza vaccine. This is recommended every year. Tetanus, diphtheria, and acellular pertussis (Tdap, Td) vaccine. You may need a Td booster every 10 years. Zoster vaccine. You may need this after age 81. Pneumococcal 13-valent conjugate (PCV13) vaccine. One dose is recommended after age 58. Pneumococcal polysaccharide (PPSV23) vaccine. One dose is recommended after age 49. Talk to your health care provider about which screenings and vaccines you need and how often you need them. This information is not intended to replace advice given to you by your health care provider. Make sure you discuss any questions you have with your health care provider. Document Released: 09/09/2015 Document Revised: 05/02/2016 Document Reviewed: 06/14/2015 Elsevier Interactive Patient Education  2017 [place] Prevention in the Home Falls can cause injuries. They can happen to people of all ages. There are many things you can do to make your home safe and to help prevent falls. What can I do on the outside of my home? Regularly fix the edges of walkways and driveways and fix any cracks. Remove anything that might make you trip as you walk through a door, such as a raised step or threshold. Trim any bushes or trees on the path to your home. Use bright outdoor lighting. Clear any walking paths of anything that might make someone trip, such as rocks or tools. Regularly check to see if handrails are loose or broken. Make sure that both sides of any steps have handrails. Any raised decks and porches should have guardrails on the edges. Have any leaves, snow, or ice cleared regularly. Use sand or salt on walking paths during winter. Clean up any spills in your garage right away. This includes oil or grease spills. What can I do in the bathroom? Use night lights. Install grab bars by the toilet and in the tub and shower. Do not use towel bars as  grab bars. Use non-skid mats or decals in the tub or shower. If you need to sit down in the shower, use a plastic, non-slip stool. Keep the floor dry. Clean up any water that spills on the floor as soon as it happens. Remove soap buildup in the tub or shower regularly. Attach bath mats securely with double-sided non-slip rug tape. Do not have throw rugs and other things on the floor that can make you trip. What can I do in the bedroom? Use night lights. Make sure that you have a light by your bed that is easy to reach. Do not use any sheets or blankets that are too big for your bed. They should not hang down onto the floor. Have a firm chair that has side arms. You can use this for support while you get dressed. Do not have throw rugs and other things on the floor that can make you trip. What can I do in the kitchen? Clean up any spills right away. Avoid walking on wet floors. Keep items that you use a lot in easy-to-reach places.  If you need to reach something above you, use a strong step stool that has a grab bar. Keep electrical cords out of the way. Do not use floor polish or wax that makes floors slippery. If you must use wax, use non-skid floor wax. Do not have throw rugs and other things on the floor that can make you trip. What can I do with my stairs? Do not leave any items on the stairs. Make sure that there are handrails on both sides of the stairs and use them. Fix handrails that are broken or loose. Make sure that handrails are as long as the stairways. Check any carpeting to make sure that it is firmly attached to the stairs. Fix any carpet that is loose or worn. Avoid having throw rugs at the top or bottom of the stairs. If you do have throw rugs, attach them to the floor with carpet tape. Make sure that you have a light switch at the top of the stairs and the bottom of the stairs. If you do not have them, ask someone to add them for you. What else can I do to help prevent  falls? Wear shoes that: Do not have high heels. Have rubber bottoms. Are comfortable and fit you well. Are closed at the toe. Do not wear sandals. If you use a stepladder: Make sure that it is fully opened. Do not climb a closed stepladder. Make sure that both sides of the stepladder are locked into place. Ask someone to hold it for you, if possible. Clearly mark and make sure that you can see: Any grab bars or handrails. First and last steps. Where the edge of each step is. Use tools that help you move around (mobility aids) if they are needed. These include: Canes. Walkers. Scooters. Crutches. Turn on the lights when you go into a dark area. Replace any light bulbs as soon as they burn out. Set up your furniture so you have a clear path. Avoid moving your furniture around. If any of your floors are uneven, fix them. If there are any pets around you, be aware of where they are. Review your medicines with your doctor. Some medicines can make you feel dizzy. This can increase your chance of falling. Ask your doctor what other things that you can do to help prevent falls. This information is not intended to replace advice given to you by your health care provider. Make sure you discuss any questions you have with your health care provider. Document Released: 06/09/2009 Document Revised: 01/19/2016 Document Reviewed: 09/17/2014 Elsevier Interactive Patient Education  2017 [employer].

## 2022-11-02 NOTE — Progress Notes (Unsigned)
Subjective:    Patient ID: Morgan Mcclain, female    DOB: 16-Dec-1950, 72 y.o.   MRN: [id number]  Morgan Mcclain is a 72 y.o. female presenting on 11/02/2022 for Annual Exam   HPI  Here for Annual Physical and lab Review.  Pre-HTN No recent home BP readings, she has previously done Home BP readings have been normal. Not taking HCTZ regularly - has discontinued this med. Controlled edema LE on compression and diuretic PRN Denies CP, dyspnea, HA, dizziness / lightheadedness   Pre-Diabetes / HYPERLIPIDEMIA / MORBID OBESITY BMI >38 Last result A1c 5.6 improved prior 5.7 to 5.9 range, similar to prior results in mild Pre-DM Last lab lipid LDL down from 140 to 103 and TC 200 to 163 on statin now. Tolerating well, taking Rosuvastatin '10mg'$  daily Interested in medication for this Lifestyle Weight elevated still without significant loss - Diet: Still improving diet, reduced portion size not always adhering - Exercise: goal to start more regular walking and exercise still Denies hypoglycemia   Psoriatic Arthritis / OA/DJD Followed by Dermatology on medication management.   Health Maintenance: Will consider Shingrix upgraded vaccine at pharmacy (previously had Zostavax in past) Will pursue PCV20 prevnar20 in future at pharmacy when ready, had Prevnar13 at age 70 but no other dose.   UTD Colonoscopy 2016, 10 year repeat, next in 2026   UTD Mammogram completed 09/28/21    Chronic R Shoulder Pain Has had some persistent issue with Neck and R Shoulder symptoms. It bothers her still worse at night. She will continue with chiropractor and she prefers to avoid the orthopedic or surgery. She has no range of motion limitation. ***  Health Maintenance:  Pnueumonia vaccine - she did 07/31/17 Prevnar-13.     11/02/2022    9:26 AM 05/21/2022    1:41 PM 09/29/2021    9:36 AM  Depression screen PHQ 2/9  Decreased Interest 0 0 0  Down, Depressed, Hopeless 0 0 0  PHQ - 2 Score 0 0 0  Altered  sleeping 0 0   Tired, decreased energy 0 1   Change in appetite 0 1   Feeling bad or failure about yourself  0 0   Trouble concentrating 0 0   Moving slowly or fidgety/restless 0 0   Suicidal thoughts 0 0   PHQ-9 Score 0 2   Difficult doing work/chores Not difficult at all Not difficult at all     Past Medical History:  Diagnosis Date   Arthritis    Left ureteral calculus    Psoriasis SEVERE--  RECEIVES LASE TX'S TWICE WEEKLY   Tremfya   Renal calculus, left    Sleep apnea 02/23/2015   No longer on CPAP   Past Surgical History:  Procedure Laterality Date   ABDOMINAL HYSTERECTOMY  1994   W/ UNILATERAL SALPINGOOPHORECTOMY   BREAST BIOPSY Right 07/14/2019   Affirm Bx- Coil clip-    FIBROADENOMATOID CHANGE    BREAST BIOPSY Left 07/14/2019   Affirm Bx- X Clip-   FIBROADENOMATOID CHANGE    COLONOSCOPY WITH PROPOFOL N/A 03/04/2015   Procedure: COLONOSCOPY WITH PROPOFOL;  Surgeon: Lucilla Lame, MD;  Location: [facility];  Service: Endoscopy;  Laterality: N/A;   CYSTOSCOPY WITH RETROGRADE PYELOGRAM, URETEROSCOPY AND STENT PLACEMENT Left 12/25/2012   Procedure: CYSTOSCOPY WITH RETROGRADE PYELOGRAM, URETEROSCOPY AND STENT PLACEMENT;  Surgeon: Alexis Frock, MD;  Location: [facility];  Service: Urology;  Laterality: Left;  90 MIN NEEDS DIG URETEROSCOPE    HOLMIUM LASER  APPLICATION Left [id number]   Procedure: HOLMIUM LASER APPLICATION;  Surgeon: Alexis Frock, MD;  Location[facility];  Service: Urology;  Laterality: Left;   TONSILLECTOMY  AGE 52   Social History   Socioeconomic History   Marital status: Married    Spouse name: Not on file   Number of children: 2   Years of education: Administrative Degree 6 yr   Highest education level: Master's degree (e.g., MA, MS, MEng, MEd, MSW, MBA)  Occupational History   Occupation: Retired - Tourist information centre manager  Tobacco Use   Smoking status: Never   Smokeless tobacco: Never  Vaping Use   Vaping Use: Never used   Substance and Sexual Activity   Alcohol use: No   Drug use: No   Sexual activity: Not on file  Other Topics Concern   Not on file  Social History Narrative   Not on file   Social Determinants of Health   Financial Resource Strain: Low Risk  (11/02/2022)   Overall Financial Resource Strain (CARDIA)    Difficulty of Paying Living Expenses: Not hard at all  Food Insecurity: No Food Insecurity (11/02/2022)   Hunger Vital Sign    Worried About Running Out of Food in the Last Year: Never true    [place] in the Last Year: Never true  Transportation Needs: No Transportation Needs (11/02/2022)   PRAPARE - Hydrologist (Medical): No    Lack of Transportation (Non-Medical): No  Physical Activity: Inactive (11/02/2022)   Exercise Vital Sign    Days of Exercise per Week: 0 days    Minutes of Exercise per Session: 0 min  Stress: No Stress Concern Present (11/02/2022)   [place]    Feeling of Stress : Not at all  Social Connections: [place] (11/02/2022)   Social Connection and Isolation Panel [NHANES]    Frequency of Communication with Friends and Family: More than three times a week    Frequency of Social Gatherings with Friends and Family: Three times a week    Attends Religious Services: More than 4 times per year    Active Member of Clubs or Organizations: Yes    Attends Archivist Meetings: More than 4 times per year    Marital Status: Married  Human resources officer Violence: Not At Risk (11/02/2022)   Humiliation, Afraid, Rape, and Kick questionnaire    Fear of Current or Ex-Partner: No    Emotionally Abused: No    Physically Abused: No    Sexually Abused: No   Family History  Problem Relation Age of Onset   Heart failure Mother    Arthritis Mother    Heart attack Mother 2       Secondary CVA   Cancer Father        lung cancer   Heart disease Sister 47   Breast  cancer Neg Hx    Current Outpatient Medications on File Prior to Visit  Medication Sig   Cholecalciferol (VITAMIN D3) 50 MCG (2000 UT) TABS Take 2,000 Units by mouth daily.   hydrocortisone 2.5 % cream Apply to affected areas 1-2 times daily as needed for itch.   ketoconazole (NIZORAL) 2 % cream Apply 1-2 times daily to body folds prn flares.   TREMFYA 100 MG/ML SOSY Inject 100 mg subcutaneously every 8 weeks as directed.   vitamin B-12 (CYANOCOBALAMIN) 1000 MCG tablet Take 1,000 mcg by mouth daily.   baclofen (  LIORESAL) 10 MG tablet Take 0.5-1 tablets (5-10 mg total) by mouth 2 (two) times daily as needed for muscle spasms. (Patient not taking: Reported on 11/02/2022)   No current facility-administered medications on file prior to visit.    Review of Systems Per HPI unless specifically indicated above     Objective:    BP 138/84   Pulse 64   Ht '5\' 7"'$  (1.702 m)   Wt 239 lb (108.4 kg)   SpO2 98%   BMI 37.43 kg/m   Wt Readings from Last 3 Encounters:  11/02/22 239 lb (108.4 kg)  11/02/22 239 lb 12.8 oz (108.8 kg)  05/21/22 242 lb (109.8 kg)    Physical Exam   Results for orders placed or performed in visit on 10/29/22  TSH  Result Value Ref Range   TSH 1.16 0.40 - 4.50 mIU/L  CBC with Differential/Platelet  Result Value Ref Range   WBC 5.0 3.8 - 10.8 Thousand/uL   RBC 4.51 3.80 - 5.10 Million/uL   Hemoglobin 14.1 11.7 - 15.5 g/dL   HCT 42.7 35.0 - 45.0 %   MCV 94.7 80.0 - 100.0 fL   MCH 31.3 27.0 - 33.0 pg   MCHC 33.0 32.0 - 36.0 g/dL   RDW 12.0 11.0 - 15.0 %   Platelets 138 (L) 140 - 400 Thousand/uL   MPV 11.4 7.5 - 12.5 fL   Neutro Abs 3,025 1,500 - 7,800 cells/uL   Lymphs Abs 1,330 850 - 3,900 cells/uL   Absolute Monocytes 525 200 - 950 cells/uL   Eosinophils Absolute 90 15 - 500 cells/uL   Basophils Absolute 30 0 - 200 cells/uL   Neutrophils Relative % 60.5 %   Total Lymphocyte 26.6 %   Monocytes Relative 10.5 %   Eosinophils Relative 1.8 %   Basophils  Relative 0.6 %  Lipid panel  Result Value Ref Range   Cholesterol 206 (H) <200 mg/dL   HDL 47 (L) > OR = 50 mg/dL   Triglycerides 101 <150 mg/dL   LDL Cholesterol (Calc) 139 (H) mg/dL (calc)   Total CHOL/HDL Ratio 4.4 <5.0 (calc)   Non-HDL Cholesterol (Calc) 159 (H) <130 mg/dL (calc)  Comprehensive metabolic panel  Result Value Ref Range   Glucose, Bld 91 65 - 99 mg/dL   BUN 13 7 - 25 mg/dL   Creat 0.74 0.60 - 1.00 mg/dL   BUN/Creatinine Ratio SEE NOTE: 6 - 22 (calc)   Sodium 140 135 - 146 mmol/L   Potassium 4.4 3.5 - 5.3 mmol/L   Chloride 105 98 - 110 mmol/L   CO2 27 20 - 32 mmol/L   Calcium 9.3 8.6 - 10.4 mg/dL   Total Protein 7.4 6.1 - 8.1 g/dL   Albumin 4.1 3.6 - 5.1 g/dL   Globulin 3.3 1.9 - 3.7 g/dL (calc)   AG Ratio 1.2 1.0 - 2.5 (calc)   Total Bilirubin 0.5 0.2 - 1.2 mg/dL   Alkaline phosphatase (APISO) 100 37 - 153 U/L   AST 15 10 - 35 U/L   ALT 9 6 - 29 U/L  Hemoglobin A1c  Result Value Ref Range   Hgb A1c MFr Bld 6.0 (H) <5.7 % of total Hgb   Mean Plasma Glucose 126 mg/dL   eAG (mmol/L) 7.0 mmol/L      Assessment & Plan:   Problem List Items Addressed This Visit     Hyperlipidemia   Relevant Medications   rosuvastatin (CRESTOR) 10 MG tablet   Morbid obesity (HCC)   Pre-diabetes  Pre-hypertension   Psoriasis with arthropathy ([place])   Other Visit Diagnoses     Annual physical exam    -  Primary       Updated Health Maintenance information Reviewed recent lab results with patient Encouraged improvement to lifestyle with diet and exercise Goal of weight loss   Meds ordered this encounter  Medications   rosuvastatin (CRESTOR) 10 MG tablet    Sig: Take 1 tablet (10 mg total) by mouth at bedtime.    Dispense:  90 tablet    Refill:  3    Follow up plan: No follow-ups on file.  ***Future Vitamin D and B12  Nobie Putnam, DO [facility] 11/02/2022, 11:06 AM

## 2022-11-02 NOTE — Progress Notes (Signed)
Subjective:   Morgan Mcclain is a 72 y.o. female who presents for Medicare Annual (Subsequent) preventive examination.  Review of Systems     Cardiac Risk Factors include: advanced age (>80mn, >>61women)     Objective:    Today's Vitals   11/02/22 0919  BP: (!) 150/82  Weight: 239 lb 12.8 oz (108.8 kg)  Height: '5\' 7"'$  (1.702 m)   Body mass index is 37.56 kg/m.     11/02/2022    9:29 AM 09/29/2021    9:39 AM 09/06/2020   11:02 AM 09/01/2019    3:23 PM 08/05/2018    9:27 AM 12/05/2017    1:00 PM 09/16/2017   10:37 AM  Advanced Directives  Does Patient Have a Medical Advance Directive? No No Yes Yes Yes Yes Yes  Type of AComptrollerLiving will Living will;Healthcare Power of AMerigoldLiving will    Does patient want to make changes to medical advance directive?       No - Patient declined  Copy of H[place]in Chart?   No - copy requested No - copy requested No - copy requested    Would patient like information on creating a medical advance directive? No - Patient declined No - Patient declined         Current Medications (verified) Outpatient Encounter Medications as of 11/02/2022  Medication Sig   Cholecalciferol (VITAMIN D3) 50 MCG (2000 UT) TABS Take 2,000 Units by mouth daily.   hydrocortisone 2.5 % cream Apply to affected areas 1-2 times daily as needed for itch.   ketoconazole (NIZORAL) 2 % cream Apply 1-2 times daily to body folds prn flares.   rosuvastatin (CRESTOR) 10 MG tablet Take 1 tablet (10 mg total) by mouth at bedtime.   TREMFYA 100 MG/ML SOSY Inject 100 mg subcutaneously every 8 weeks as directed.   vitamin B-12 (CYANOCOBALAMIN) 1000 MCG tablet Take 1,000 mcg by mouth daily.   baclofen (LIORESAL) 10 MG tablet Take 0.5-1 tablets (5-10 mg total) by mouth 2 (two) times daily as needed for muscle spasms. (Patient not taking: Reported on 11/02/2022)   No facility-administered  encounter medications on file as of 11/02/2022.    Allergies (verified) Lamisil [terbinafine] and Shellfish allergy   History: Past Medical History:  Diagnosis Date   Arthritis    Left ureteral calculus    Psoriasis SEVERE--  RECEIVES LASE TX'S TWICE WEEKLY   Tremfya   Renal calculus, left    Sleep apnea 02/23/2015   No longer on CPAP   Past Surgical History:  Procedure Laterality Date   ABDOMINAL HYSTERECTOMY  1994   W/ UNILATERAL SALPINGOOPHORECTOMY   BREAST BIOPSY Right 07/14/2019   Affirm Bx- Coil clip-    FIBROADENOMATOID CHANGE    BREAST BIOPSY Left 07/14/2019   Affirm Bx- X Clip-   FIBROADENOMATOID CHANGE    COLONOSCOPY WITH PROPOFOL N/A 03/04/2015   Procedure: COLONOSCOPY WITH PROPOFOL;  Surgeon: DLucilla Lame MD;  [facility] [facility]  Service: Endoscopy;  Laterality: N/A;   CYSTOSCOPY WITH RETROGRADE PYELOGRAM, URETEROSCOPY AND STENT PLACEMENT Left 12/25/2012   Procedure: CYSTOSCOPY WITH RETROGRADE PYELOGRAM, URETEROSCOPY AND STENT PLACEMENT;  Surgeon: TAlexis Frock MD;  [facility]  Service: Urology;  Laterality: Left;  90 MIN NEEDS DIG URETEROSCOPE    HOLMIUM LASER APPLICATION Left [id number]  Procedure: HOLMIUM LASER APPLICATION;  Surgeon: TAlexis Frock MD;  [facility]  Service: Urology;  Laterality: Left;   TONSILLECTOMY  AGE 58   Family History  Problem Relation Age of Onset   Heart failure Mother    Arthritis Mother    Heart attack Mother 5       Secondary CVA   Cancer Father        lung cancer   Heart disease Sister 44   Breast cancer Neg Hx    Social History   Socioeconomic History   Marital status: Married    Spouse name: Not on file   Number of children: 2   Years of education: Administrative Degree 6 yr   Highest education level: Master's degree (e.g., MA, MS, MEng, MEd, MSW, MBA)  Occupational History   Occupation: Retired - Tourist information centre manager  Tobacco Use   Smoking status: Never    Smokeless tobacco: Never  Vaping Use   Vaping Use: Never used  Substance and Sexual Activity   Alcohol use: No   Drug use: No   Sexual activity: Not on file  Other Topics Concern   Not on file  Social History Narrative   Not on file   Social Determinants of Health   Financial Resource Strain: Low Risk  (11/02/2022)   Overall Financial Resource Strain (CARDIA)    Difficulty of Paying Living Expenses: Not hard at all  Food Insecurity: No Food Insecurity (11/02/2022)   Hunger Vital Sign    Worried About Running Out of Food in the Last Year: Never true    [place] in the Last Year: Never true  Transportation Needs: No Transportation Needs (11/02/2022)   PRAPARE - Hydrologist (Medical): No    Lack of Transportation (Non-Medical): No  Physical Activity: Inactive (11/02/2022)   Exercise Vital Sign    Days of Exercise per Week: 0 days    Minutes of Exercise per Session: 0 min  Stress: No Stress Concern Present (11/02/2022)   [place]    Feeling of Stress : Not at all  Social Connections: [place] (11/02/2022)   Social Connection and Isolation Panel [NHANES]    Frequency of Communication with Friends and Family: More than three times a week    Frequency of Social Gatherings with Friends and Family: Three times a week    Attends Religious Services: More than 4 times per year    Active Member of Clubs or Organizations: Yes    Attends Music therapist: More than 4 times per year    Marital Status: Married    Tobacco Counseling Counseling given: Not Answered   Clinical Intake:  Pre-visit preparation completed: Yes  Pain : No/denies pain     Nutritional Risks: None Diabetes: No  How often do you need to have someone help you when you read instructions, pamphlets, or other written materials from your doctor or pharmacy?: 1 - Never  Diabetic?no  Interpreter  Needed?: No  Information entered by :: Kirke Shaggy, LPN   Activities of Daily Living    11/02/2022    9:31 AM  In your present state of health, do you have any difficulty performing the following activities:  Hearing? 0  Vision? 0  Difficulty concentrating or making decisions? 0  Walking or climbing stairs? 0  Dressing or bathing? 0  Doing errands, shopping? 0  Preparing Food and eating ? N  Using the Toilet? N  In the past six months, have you accidently leaked urine? N  Do you have problems with loss of bowel control? N  Managing your Medications? N  Managing your Finances? N  Housekeeping or managing your Housekeeping? N    Patient Care Team: Olin Hauser, DO as PCP - General (Family Medicine) Brendolyn Patty, MD (Dermatology) Marlowe Sax, MD as Referring Physician (Internal Medicine)  Indicate any recent Medical Services you may have received from other than [facility] providers in the past year (date may be approximate).     Assessment:   This is a routine wellness examination for Morgan Mcclain.  Hearing/Vision screen Hearing Screening - Comments:: No aids Vision Screening - Comments:: Readers-  [facility] [place] across from mall  Dietary issues and exercise activities discussed: Current Exercise Habits: The patient does not participate in regular exercise at present   Goals Addressed             This Visit's Progress    DIET - INCREASE WATER INTAKE        Depression Screen    11/02/2022    9:26 AM 05/21/2022    1:41 PM 09/29/2021    9:36 AM 01/25/2021   10:55 AM 09/06/2020   11:03 AM 08/09/2020    9:33 AM 11/09/2019    9:57 AM  PHQ 2/9 Scores  PHQ - 2 Score 0 0 0 0 0 0 0  PHQ- 9 Score 0 2  2       Fall Risk    11/02/2022    9:30 AM 05/21/2022    1:41 PM 09/29/2021    9:41 AM 09/06/2020   11:02 AM 08/09/2020    9:33 AM  Fall Risk   Falls in the past year? 0 0 1 1 0  Comment    missed a step   Number falls in past yr: 0 0 0 0 0  Injury with  Fall? 0 0 0 0 0  Risk for fall due to : No Fall Risks No Fall Risks History of fall(s) Medication side effect   Follow up Falls prevention discussed;Falls evaluation completed Falls evaluation completed Falls prevention discussed Falls evaluation completed;Education provided;Falls prevention discussed Falls evaluation completed    FALL RISK PREVENTION PERTAINING TO THE HOME:  Any stairs in or around the home? Yes  If so, are there any without handrails? No  Home free of loose throw rugs in walkways, pet beds, electrical cords, etc? Yes  Adequate lighting in your home to reduce risk of falls? Yes   ASSISTIVE DEVICES UTILIZED TO PREVENT FALLS:  Life alert? No  Use of a cane, walker or w/c? No  Grab bars in the bathroom? No  Shower chair or bench in shower? No  Elevated toilet seat or a handicapped toilet? No   TIMED UP AND GO:  Was the test performed? Yes .  Length of time to ambulate 10 feet: 4 sec.   Gait steady and fast without use of assistive device  Cognitive Function:        11/02/2022    9:37 AM 09/06/2020   11:05 AM 08/05/2018    9:39 AM 07/30/2017    9:43 AM  6CIT Screen  What Year? 0 points 0 points 0 points 0 points  What month? 0 points 0 points 0 points 0 points  What time? 0 points 0 points 0 points 0 points  Count back from 20 0 points 0 points 0 points 0 points  Months in reverse 0 points 0 points 0 points 0 points  Repeat phrase 0 points 2 points  0 points 2 points  Total Score 0 points 2 points 0 points 2 points    Immunizations Immunization History  Administered Date(s) Administered   Fluad Quad(high Dose 65+) 06/12/2019   Influenza, High Dose Seasonal PF 07/27/2017, 07/29/2018, 07/07/2021   Influenza-Unspecified 04/27/2016, 07/27/2017, 07/16/2020   PFIZER(Purple Top)SARS-COV-2 Vaccination 09/18/2019, 10/10/2019, 07/04/2020   Pfizer Covid-19 Vaccine Bivalent Booster 3yr & up 07/31/2021   Pneumococcal Conjugate-13 07/31/2017   Tdap 08/27/2009    Unspecified SARS-COV-2 Vaccination 07/17/2022   Zoster, Live 08/27/2013    TDAP status: Due, Education has been provided regarding the importance of this vaccine. Advised may receive this vaccine at local pharmacy or Health Dept. Aware to provide a copy of the vaccination record if obtained from local pharmacy or Health Dept. Verbalized acceptance and understanding.  Flu Vaccine status: Up to date  Pneumococcal vaccine status: Declined,  Education has been provided regarding the importance of this vaccine but patient still declined. Advised may receive this vaccine at local pharmacy or Health Dept. Aware to provide a copy of the vaccination record if obtained from local pharmacy or Health Dept. Verbalized acceptance and understanding.   Covid-19 vaccine status: Completed vaccines  Qualifies for Shingles Vaccine? Yes   Zostavax completed Yes   Shingrix Completed?: No.    Education has been provided regarding the importance of this vaccine. Patient has been advised to call insurance company to determine out of pocket expense if they have not yet received this vaccine. Advised may also receive vaccine at local pharmacy or Health Dept. Verbalized acceptance and understanding.  Screening Tests Health Maintenance  Topic Date Due   Zoster Vaccines- Shingrix (1 of 2) Never done   Pneumonia Vaccine 72 Years old (2 of 2 - PPSV23 or PCV20) 07/31/2018   DTaP/Tdap/Td (2 - Td or Tdap) 08/28/2019   INFLUENZA VACCINE  03/27/2022   COVID-19 Vaccine (6 - 2023-24 season) 09/11/2022   DEXA SCAN  08/27/2023   MAMMOGRAM  10/13/2023   Medicare Annual Wellness (AWV)  11/02/2023   COLONOSCOPY (Pts 45-480yrInsurance coverage will need to be confirmed)  03/03/2025   Hepatitis C Screening  Completed   HPV VACCINES  Aged Out    Health Maintenance  Health Maintenance Due  Topic Date Due   Zoster Vaccines- Shingrix (1 of 2) Never done   Pneumonia Vaccine 6573Years old (2 of 2 - PPSV23 or PCV20) 07/31/2018    DTaP/Tdap/Td (2 - Td or Tdap) 08/28/2019   INFLUENZA VACCINE  03/27/2022   COVID-19 Vaccine (6 - 2023-24 season) 09/11/2022    Colorectal cancer screening: Type of screening: Colonoscopy. Completed 03/04/15. Repeat every 10 years  Mammogram status: Completed 10/12/22. Repeat every year  Bone Density status: Ordered 11/02/22. Pt provided with contact info and advised to call to schedule appt.  Lung Cancer Screening: (Low Dose CT Chest recommended if Age 72-80ears, 30 pack-year currently smoking OR have quit w/in 15years.) does not qualify.   Additional Screening:  Hepatitis C Screening: does qualify; Completed 06/04/16  Vision Screening: Recommended annual ophthalmology exams for early detection of glaucoma and other disorders of the eye. Is the patient up to date with their annual eye exam?  Yes  Who is the provider or what is the name of the office in which the patient attends annual eye exams? [facility] with a provider, would they like to be referred to a provider to establish care? No .   Dental Screening: Recommended annual dental exams  for proper oral hygiene  Community Resource Referral / Chronic Care Management: CRR required this visit?  No   CCM required this visit?  No      Plan:     I have personally reviewed and noted the following in the patient's chart:   Medical and social history Use of alcohol, tobacco or illicit drugs  Current medications and supplements including opioid prescriptions. Patient is not currently taking opioid prescriptions. Functional ability and status Nutritional status Physical activity Advanced directives List of other physicians Hospitalizations, surgeries, and ER visits in previous 12 months Vitals Screenings to include cognitive, depression, and falls Referrals and appointments  In addition, I have reviewed and discussed with patient certain preventive protocols, quality metrics, and best practice  recommendations. A written personalized care plan for preventive services as well as general preventive health recommendations were provided to patient.     Dionisio David, LPN   [phone]   Nurse Notes: none

## 2022-11-03 ENCOUNTER — Other Ambulatory Visit: Payer: Self-pay | Admitting: Family Medicine

## 2022-11-03 DIAGNOSIS — Z0000 Encounter for general adult medical examination without abnormal findings: Secondary | ICD-10-CM

## 2022-11-03 DIAGNOSIS — E559 Vitamin D deficiency, unspecified: Secondary | ICD-10-CM

## 2022-11-03 DIAGNOSIS — E538 Deficiency of other specified B group vitamins: Secondary | ICD-10-CM

## 2022-11-03 DIAGNOSIS — E782 Mixed hyperlipidemia: Secondary | ICD-10-CM

## 2022-11-03 DIAGNOSIS — R7303 Prediabetes: Secondary | ICD-10-CM

## 2022-11-03 NOTE — Assessment & Plan Note (Signed)
Elevated LDL Was off Statin Re order today

## 2022-11-03 NOTE — Assessment & Plan Note (Signed)
A1c 6.0 AVS on GLP therapy for weight management

## 2022-11-03 NOTE — Assessment & Plan Note (Signed)
Followed by Dermatology/Rheumatology On biologic agent currently

## 2022-11-08 DIAGNOSIS — L400 Psoriasis vulgaris: Secondary | ICD-10-CM | POA: Diagnosis not present

## 2022-11-08 DIAGNOSIS — L409 Psoriasis, unspecified: Secondary | ICD-10-CM | POA: Diagnosis not present

## 2022-11-08 DIAGNOSIS — Z79899 Other long term (current) drug therapy: Secondary | ICD-10-CM | POA: Diagnosis not present

## 2022-11-12 DIAGNOSIS — M542 Cervicalgia: Secondary | ICD-10-CM | POA: Diagnosis not present

## 2022-11-12 DIAGNOSIS — M546 Pain in thoracic spine: Secondary | ICD-10-CM | POA: Diagnosis not present

## 2022-11-12 DIAGNOSIS — M9902 Segmental and somatic dysfunction of thoracic region: Secondary | ICD-10-CM | POA: Diagnosis not present

## 2022-11-12 DIAGNOSIS — M9901 Segmental and somatic dysfunction of cervical region: Secondary | ICD-10-CM | POA: Diagnosis not present

## 2022-11-18 LAB — QUANTIFERON-TB GOLD PLUS
QuantiFERON Mitogen Value: >10 IU/mL
QuantiFERON Nil Value: 0.05 IU/mL
QuantiFERON TB1 Ag Value: 0.05 IU/mL
QuantiFERON TB2 Ag Value: 0.07 IU/mL
QuantiFERON-TB Gold Plus: NEGATIVE

## 2022-11-19 ENCOUNTER — Telehealth: Payer: Self-pay

## 2022-11-19 DIAGNOSIS — L409 Psoriasis, unspecified: Secondary | ICD-10-CM

## 2022-11-19 MED ORDER — TREMFYA 100 MG/ML ~~LOC~~ SOSY: PREFILLED_SYRINGE | SUBCUTANEOUS | 2 refills | Status: DC

## 2022-11-19 NOTE — Telephone Encounter (Signed)
Left message for patient to call for lab results. Refills of Tremfya have been sent in to the pharmacy.

## 2022-11-19 NOTE — Telephone Encounter (Signed)
-----   Message from Brendolyn Patty, MD sent at 11/19/2022 12:42 PM EDT ----- 11/08/22 TB test is negative, 10/29/22 CMP and CBC reviewed and okay.  Send in rfs Tremfya - please call patient

## 2022-11-21 NOTE — Telephone Encounter (Signed)
Left pt msg to call for lab results/sh 

## 2022-11-22 NOTE — Telephone Encounter (Signed)
Patient notified of lab results

## 2022-12-03 DIAGNOSIS — M9902 Segmental and somatic dysfunction of thoracic region: Secondary | ICD-10-CM | POA: Diagnosis not present

## 2022-12-03 DIAGNOSIS — M542 Cervicalgia: Secondary | ICD-10-CM | POA: Diagnosis not present

## 2022-12-03 DIAGNOSIS — M546 Pain in thoracic spine: Secondary | ICD-10-CM | POA: Diagnosis not present

## 2022-12-03 DIAGNOSIS — M9901 Segmental and somatic dysfunction of cervical region: Secondary | ICD-10-CM | POA: Diagnosis not present

## 2022-12-04 ENCOUNTER — Encounter: Payer: Self-pay | Admitting: Family Medicine

## 2022-12-31 ENCOUNTER — Ambulatory Visit (INDEPENDENT_AMBULATORY_CARE_PROVIDER_SITE_OTHER): Payer: Medicare PPO

## 2022-12-31 DIAGNOSIS — L400 Psoriasis vulgaris: Secondary | ICD-10-CM | POA: Diagnosis not present

## 2022-12-31 MED ORDER — GUSELKUMAB 100 MG/ML ~~LOC~~ SOSY
100.0000 mg | PREFILLED_SYRINGE | Freq: Once | SUBCUTANEOUS | Status: AC
  Administered 2022-12-31: 100 mg via SUBCUTANEOUS

## 2022-12-31 NOTE — Progress Notes (Signed)
Patient here today for 8 week Tremfya injection for Psoriasis Vulgaris.    Tremfya 100mg  injected into right lower abdomen. Patient tolerated well.   LOT: [id number] EXP: 02/2024 QIO:[id number]   Evorn Gong RMA

## 2023-01-01 DIAGNOSIS — M9901 Segmental and somatic dysfunction of cervical region: Secondary | ICD-10-CM | POA: Diagnosis not present

## 2023-01-01 DIAGNOSIS — M9902 Segmental and somatic dysfunction of thoracic region: Secondary | ICD-10-CM | POA: Diagnosis not present

## 2023-01-01 DIAGNOSIS — M542 Cervicalgia: Secondary | ICD-10-CM | POA: Diagnosis not present

## 2023-01-01 DIAGNOSIS — M546 Pain in thoracic spine: Secondary | ICD-10-CM | POA: Diagnosis not present

## 2023-01-07 ENCOUNTER — Encounter: Payer: Self-pay | Admitting: Family Medicine

## 2023-01-07 ENCOUNTER — Emergency Department
Admission: EM | Admit: 2023-01-07 | Discharge: 2023-01-07 | Disposition: A | Discharge status: Home / Self Care | Payer: Medicare PPO | Attending: Emergency Medicine | Admitting: Emergency Medicine

## 2023-01-07 ENCOUNTER — Emergency Department: Payer: Medicare PPO

## 2023-01-07 ENCOUNTER — Ambulatory Visit (INDEPENDENT_AMBULATORY_CARE_PROVIDER_SITE_OTHER): Payer: Medicare PPO | Admitting: Family Medicine

## 2023-01-07 ENCOUNTER — Other Ambulatory Visit: Payer: Self-pay

## 2023-01-07 VITALS — BP 142/78 | HR 58 | Ht 67.0 in | Wt 243.0 lb

## 2023-01-07 DIAGNOSIS — R0609 Other forms of dyspnea: Secondary | ICD-10-CM | POA: Diagnosis not present

## 2023-01-07 DIAGNOSIS — I10 Essential (primary) hypertension: Secondary | ICD-10-CM | POA: Diagnosis not present

## 2023-01-07 DIAGNOSIS — R0600 Dyspnea, unspecified: Secondary | ICD-10-CM | POA: Insufficient documentation

## 2023-01-07 DIAGNOSIS — R079 Chest pain, unspecified: Secondary | ICD-10-CM | POA: Diagnosis not present

## 2023-01-07 DIAGNOSIS — L4050 Arthropathic psoriasis, unspecified: Secondary | ICD-10-CM | POA: Diagnosis not present

## 2023-01-07 DIAGNOSIS — Z6841 Body Mass Index (BMI) 40.0 and over, adult: Secondary | ICD-10-CM

## 2023-01-07 DIAGNOSIS — R0789 Other chest pain: Secondary | ICD-10-CM | POA: Diagnosis not present

## 2023-01-07 LAB — CBC
HCT: 41.4 % (ref 36.0–46.0)
Hemoglobin: 13.3 g/dL (ref 12.0–15.0)
MCH: 31.7 pg (ref 26.0–34.0)
MCHC: 32.1 g/dL (ref 30.0–36.0)
MCV: 98.8 fL (ref 80.0–100.0)
Platelets: 193 10*3/uL (ref 150–400)
RBC: 4.19 MIL/uL (ref 3.87–5.11)
RDW: 13.1 % (ref 11.5–15.5)
WBC: 5.4 10*3/uL (ref 4.0–10.5)
nRBC: 0 % (ref 0.0–0.2)

## 2023-01-07 LAB — TROPONIN I (HIGH SENSITIVITY)
Troponin I (High Sensitivity): 7 ng/L (ref <18)
Troponin I (High Sensitivity): 7 ng/L (ref <18)

## 2023-01-07 LAB — BASIC METABOLIC PANEL
Anion gap: 6 (ref 5–15)
BUN: 11 mg/dL (ref 8–23)
CO2: 28 mmol/L (ref 22–32)
Calcium: 8.8 mg/dL — ABNORMAL LOW (ref 8.9–10.3)
Chloride: 105 mmol/L (ref 98–111)
Creatinine, Ser: 0.65 mg/dL (ref 0.44–1.00)
GFR, Estimated: >60 mL/min (ref >60)
Glucose, Bld: 90 mg/dL (ref 70–99)
Potassium: 3.5 mmol/L (ref 3.5–5.1)
Sodium: 139 mmol/L (ref 135–145)

## 2023-01-07 MED ORDER — ASPIRIN 81 MG PO TBEC
325.0000 mg | DELAYED_RELEASE_TABLET | Freq: Every day | ORAL | Status: DC
  Administered 2023-01-07: 325 mg via ORAL

## 2023-01-07 MED ORDER — ASPIRIN 81 MG PO CHEW: 324.0000 mg | CHEWABLE_TABLET | Freq: Once | ORAL | Status: DC

## 2023-01-07 NOTE — ED Triage Notes (Signed)
First nurse note:Patient arrived by EMS from PCP. Went in for routine blood work and EKG showed inverted t waves on V5&V6  Denies CP  PCP gave 324 aspirin  EMS vitals: 175/91 b/p 62HR 100% Ra

## 2023-01-07 NOTE — Progress Notes (Signed)
Subjective:    Patient ID: Morgan Mcclain, female    DOB: 1951/02/14, 72 y.o.   MRN: [id number]  Morgan Mcclain is a 72 y.o. female presenting on 01/07/2023 for Chest Pain (Present 3-4 weeks)   HPI  Intermittent Chest Pressure Dyspnea on Exertion  Admits 3-4 weeks of chest pain pressure tightness, middle of chest Episodes lasting 10-15 min, can rub it and it feels better Has residual soreness after Admits leg swelling. Chronic for years. Increased fatigue lately dyspnea with exertion Admits rare episodic VERTIGO vs dizzy spells. Has room spinning Dizzy spells or VERTIGO episodic   Pre-HYPERTENSION Elevated BP today Off of medication Home BP had been better. Today mild elevated Controlled edema LE on compression previously Denies CP, dyspnea, HA, dizziness / lightheadedness  Pre-Diabetes / HYPERLIPIDEMIA / MORBID OBESITY BMI >38 Last A1c 6.0 elevated  LDL up to 139, off statin,  Back on Rosuvastatin 10mg  Lifestyle Weight elevated Considering medication - Diet: Still improving diet, reduced portion size not always adhering - Exercise: goal to start more regular walking and exercise still Denies hypoglycemia    Psoriatic Arthritis / OA/DJD Followed by Dermatology on medication management.      11/02/2022    9:26 AM 05/21/2022    1:41 PM 09/29/2021    9:36 AM  Depression screen PHQ 2/9  Decreased Interest 0 0 0  Down, Depressed, Hopeless 0 0 0  PHQ - 2 Score 0 0 0  Altered sleeping 0 0   Tired, decreased energy 0 1   Change in appetite 0 1   Feeling bad or failure about yourself  0 0   Trouble concentrating 0 0   Moving slowly or fidgety/restless 0 0   Suicidal thoughts 0 0   PHQ-9 Score 0 2   Difficult doing work/chores Not difficult at all Not difficult at all     Social History   Tobacco Use   Smoking status: Never   Smokeless tobacco: Never  Vaping Use   Vaping Use: Never used  Substance Use Topics   Alcohol use: No   Drug use: No    Review of  Systems Per HPI unless specifically indicated above     Objective:    BP (!) 142/78   Pulse (!) 58   Ht 5\' 7"  (1.702 m)   Wt 243 lb (110.2 kg)   SpO2 100%   BMI 38.06 kg/m   Wt Readings from Last 3 Encounters:  01/07/23 243 lb (110.2 kg)  11/02/22 239 lb (108.4 kg)  11/02/22 239 lb 12.8 oz (108.8 kg)    Physical Exam Vitals and nursing note reviewed.  Constitutional:      General: She is not in acute distress.    Appearance: She is well-developed. She is obese. She is not diaphoretic.     Comments: Well-appearing, comfortable, cooperative  HENT:     Head: Normocephalic and atraumatic.  Eyes:     General:        Right eye: No discharge.        Left eye: No discharge.     Conjunctiva/sclera: Conjunctivae normal.  Neck:     Thyroid: No thyromegaly.  Cardiovascular:     Rate and Rhythm: Regular rhythm. Bradycardia present.     Heart sounds: Normal heart sounds. No murmur heard. Pulmonary:     Effort: Pulmonary effort is normal. No respiratory distress.     Breath sounds: Normal breath sounds. No wheezing or rales.  Musculoskeletal:  General: Normal range of motion.     Cervical back: Normal range of motion and neck supple.     Right lower leg: Edema present.     Left lower leg: Edema present.  Lymphadenopathy:     Cervical: No cervical adenopathy.  Skin:    General: Skin is warm and dry.     Findings: No erythema or rash.  Neurological:     Mental Status: She is alert and oriented to person, place, and time.  Psychiatric:        Behavior: Behavior normal.     Comments: Well groomed, good eye contact, normal speech and thoughts     EKG - performed in office today  Date: 01/07/23  Rate: 53  Rhythm: normal sinus rhythm  QRS Axis: normal  Intervals: normal  ST/T Wave abnormalities: nonspecific T wave changes  Conduction Disutrbances:none  Additional Narrative Interpretation: TWI L5, L6, appears new from 2017 prior EKG  Old EKG Reviewed: changes  noted     Results for orders placed or performed in visit on 10/29/22  QuantiFERON-TB Gold Plus  Result Value Ref Range   QuantiFERON Incubation Incubation performed.    QuantiFERON Criteria Comment    QuantiFERON TB1 Ag Value 0.05 IU/mL   QuantiFERON TB2 Ag Value 0.07 IU/mL   QuantiFERON Nil Value 0.05 IU/mL   QuantiFERON Mitogen Value >10.00 IU/mL   QuantiFERON-TB Gold Plus Negative Negative      Assessment & Plan:   Problem List Items Addressed This Visit     Psoriasis with arthropathy (HCC)   Relevant Medications   aspirin 325 MG tablet   aspirin EC tablet 325 mg   Other Visit Diagnoses     Intermittent chest pain    -  Primary   Relevant Medications   aspirin 325 MG tablet   aspirin EC tablet 325 mg   Other Relevant Orders   EKG 12-Lead   Ambulatory referral to Cardiology   Dyspnea on exertion       Relevant Medications   aspirin EC tablet 325 mg   Other Relevant Orders   EKG 12-Lead   Ambulatory referral to Cardiology   Morbid obesity with BMI of 40.0-44.9, adult (HCC)       Relevant Medications   aspirin EC tablet 325 mg       Acute visit today for dyspnea on exertion intermittent chest pain pressure over 3+ weeks She has multiple cardiovascular risk factors obesity elevated BP vs HYPERTENSION, pre diabetes, Hyperlipidemia No  prior MI or cardiovascular event No AFib She has bradycardia, no arrhythmia or irregularity on exam  EKG done today ,showed new T Wave inversions lateral leads V5 V6  EMS called and will send her to [facility] ED today for further Chest Pain / work up rule out evaluation  Cancelled order for lab Troponin here and Referral to Cards today, pending results from ED Visit will reconsider referral, she is interested in [facility], family member has been there.  Aspirin 325mg  given now today.  She is hemodynamically stable, BP mild elevated, HR is stable low bradycardia. She endorses similar symptoms but no worsening  Chronic leg  swelling but no recent change or acute worsening.  Advised her return precautions.   Meds ordered this encounter  Medications   aspirin EC tablet 325 mg      Follow up plan: Return if symptoms worsen or fail to improve.    Saralyn Pilar, DO [facility] 01/07/2023, 11:07  AM

## 2023-01-07 NOTE — ED Triage Notes (Signed)
See first nurse note. Pt has been experiencing intermittent chest tightness since 3 weeks. Pt denies chest pain at this time. Pt went to PCP and they did EKG and sent her because inverted t waves, v5 v6. Pt denies SOB. Also has chronic intermittent lower extremity edema. Pt is in no acute distress, respirations unlabored, skin dry.

## 2023-01-07 NOTE — ED Provider Notes (Signed)
[facility] Provider Note    Event Date/Time   First MD Initiated Contact with Patient 01/07/23 1327     (approximate)   History   Chest Pain   HPI  Morgan Mcclain is a 72 y.o. female with past medical history of obesity hypertension who presents with chest pain.  Over last 3 weeks patient's had intermittent episodes of chest pain.  Describes as pressure-like lasting for minutes at a time.  No clear provocative factors.  Occasionally did radiate down her arm on the left but this was just 1 time.  It is associate with dyspnea known nausea or diaphoresis.  She had an episode yesterday that lasted several minutes occurred when she was walking around.  Today had a very short-lived episode while in the waiting room.  She went to her primary doctor today and had an EKG that showed some T wave inversion so she was referred to the ER.     Past Medical History:  Diagnosis Date   Arthritis    Left ureteral calculus    Psoriasis SEVERE--  RECEIVES LASE TX'S TWICE WEEKLY   Tremfya   Renal calculus, left    Sleep apnea 02/23/2015   No longer on CPAP    Patient Active Problem List   Diagnosis Date Noted   Psoriasis 09/28/2021   Buttock wound 04/15/2018   Vitamin B12 deficiency 09/10/2017   Numbness and tingling of foot 08/29/2017   Polyarthralgia 08/29/2017   Osteoarthritis of both knees 10/15/2016   Chronic pain of left ankle 10/15/2016   Hyperlipidemia 10/15/2016   Pre-diabetes 06/06/2016   Psoriasis with arthropathy (HCC) 06/04/2016   Morbid obesity (HCC) 06/04/2016   Pre-hypertension 06/04/2016   Vitamin D deficiency 06/04/2016   Fatigue 06/04/2016   Lymphedema of both lower extremities 06/04/2016   Special screening for malignant neoplasms, colon    Sleep apnea 02/23/2015     Physical Exam  Triage Vital Signs: ED Triage Vitals  Enc Vitals Group     BP 01/07/23 1229 (!) 164/80     Pulse Rate 01/07/23 1229 (!) 55     Resp 01/07/23 1229 16      Temp 01/07/23 1229 97.6 F (36.4 C)     Temp Source 01/07/23 1229 Oral     SpO2 01/07/23 1229 98 %     Weight 01/07/23 1224 242 lb 8.1 oz (110 kg)     Height 01/07/23 1224 5\' 7"  (1.702 m)     Head Circumference --      Peak Flow --      Pain Score 01/07/23 1224 0     Pain Loc --      Pain Edu? --      Excl. in GC? --     Most recent vital signs: Vitals:   01/07/23 1229  BP: (!) 164/80  Pulse: (!) 55  Resp: 16  Temp: 97.6 F (36.4 C)  SpO2: 98%     General: Awake, no distress. CV:  Good peripheral perfusion.  Trace edema bilateral lower extremities Resp:  Normal effort.  Lung sounds are clear Abd:  No distention.  Neuro:             Awake, Alert, Oriented x 3  Other:     ED Results / Procedures / Treatments  Labs (all labs ordered are listed, but only abnormal results are displayed) Labs Reviewed  BASIC METABOLIC PANEL - Abnormal; Notable for the following components:      Result  Value   Calcium 8.8 (*)    All other components within normal limits  CBC  TROPONIN I (HIGH SENSITIVITY)  TROPONIN I (HIGH SENSITIVITY)     EKG  I reviewed interpreted EKG which shows sinus bradycardia with T wave inversions in V5 and V6 which appear more pronounced than prior EKG flattening in 1 and aVL    RADIOLOGY I reviewed and interpreted the CXR which does not show any acute cardiopulmonary process    PROCEDURES:  Critical Care performed: No  Procedures  for evidence of arrhythmia and/or significant heart rate changes.   MEDICATIONS ORDERED IN ED: Medications - No data to display   IMPRESSION / MDM / ASSESSMENT AND PLAN / ED COURSE  I reviewed the triage vital signs and the nursing notes.                              Patient's presentation is most consistent with acute presentation with potential threat to life or bodily function.  Differential diagnosis includes, but is not limited to, stable angina, angina, musculoskeletal pain, GI related pain will  suspicion for dissection or PE  The patient is a 72 year old female who presents with 3 weeks of intermittent chest pain.  Her last significant episode was yesterday occurred while she was walking around.  Today had a short-lived episode in the waiting room.  Describes as pressure associate with dyspnea.  Last for minutes at a time.  Other than the episode yesterday this is not clearly been during exertion.  She saw her primary doctor today referred to the ER for new T wave inversions.  Arrival mildly bradycardic.  She is not having chest pain currently.  I reviewed her EKG she does have 2 inversions laterally which are more pronounced from her prior EKG which is from 2017.  She is chest pain-free currently.  Troponin is negative.  Will repeat.  Discussed with Dr. Kirke Corin who thinks it is appropriate for patient to follow-up as an outpatient.  Will arrange for expedited workup as I am concerned for possible unstable angina.   Repeat troponin is negative.  Patient continues to be pain-free here.  Discussed starting to take a baby aspirin daily.  I placed expedited follow-up with cardiology and Dr. Kirke Corin is aware of the patient.     FINAL CLINICAL IMPRESSION(S) / ED DIAGNOSES   Final diagnoses:  Chest pain, unspecified type     Rx / DC Orders   ED Discharge Orders          Ordered    Ambulatory referral to Cardiology       Comments: If you have not heard from the Cardiology office within the next 72 hours please call [phone].   01/07/23 1503             Note:  This document was prepared using Dragon voice recognition software and may include unintentional dictation errors.   Georga Hacking, MD 01/07/23 [phone]

## 2023-01-07 NOTE — Discharge Instructions (Signed)
Your cardiac enzymes were reassuring.  However your EKG is abnormal and given your chest pain I would like you to follow-up with cardiology.  You should receive a phone call from the office if you do not then please call the number above to schedule an appointment.  If your chest pain is worsening or not going away then please return to the emergency department.

## 2023-01-07 NOTE — Patient Instructions (Addendum)
Thank you for coming to the office today.  Blood today we will contact you later today for this result. And if it is abnormal it can be sign of heart injury  EKG today  ------------  Referral today to heart doctors, they will call with apt  [facility]) HeartCare at [facility] [address] [place], [place] [address] Main: [phone]    You have symptoms of Vertigo (Benign Paroxysmal Positional Vertigo) - This is commonly caused by inner ear fluid imbalance, sometimes can be worsened by allergies and sinus symptoms, otherwise it can occur randomly sometimes and we may never discover the exact cause. - To treat this, try the Epley Manuever (see diagrams/instructions below) at home up to 3 times a day for 1-2 weeks or until symptoms resolve If you develop significant worsening episode with vertigo that does not improve and you get severe headache, loss of vision, arm or leg weakness, slurred speech, or other concerning symptoms please seek immediate medical attention at Emergency Department.  Please schedule a follow-up appointment with Dr Althea Charon within 4 weeks if Vertigo not improving, and will consider Referral to Vestibular Rehab  See the next page for images describing the Epley Manuever.     ----------------------------------------------------------------------------------------------------------------------        Please schedule a Follow-up Appointment to: Return if symptoms worsen or fail to improve.  If you have any other questions or concerns, please feel free to call the office or send a message through MyChart. You may also schedule an earlier appointment if necessary.  Additionally, you may be receiving a survey about your experience at our office within a few days to 1 week by e-mail or mail. We value your feedback.  Saralyn Pilar, DO [facility] [place]

## 2023-01-09 ENCOUNTER — Ambulatory Visit
Admission: RE | Admit: 2023-01-09 | Discharge: 2023-01-09 | Disposition: A | Discharge status: Home / Self Care | Payer: Medicare PPO | Source: Ambulatory Visit | Attending: Family Medicine | Admitting: Family Medicine

## 2023-01-09 DIAGNOSIS — Z780 Asymptomatic menopausal state: Secondary | ICD-10-CM | POA: Diagnosis not present

## 2023-01-09 DIAGNOSIS — M8589 Other specified disorders of bone density and structure, multiple sites: Secondary | ICD-10-CM | POA: Diagnosis not present

## 2023-01-10 ENCOUNTER — Encounter: Payer: Self-pay | Admitting: Cardiovascular Disease

## 2023-01-10 ENCOUNTER — Ambulatory Visit: Payer: Medicare PPO | Attending: Cardiovascular Disease | Admitting: Cardiovascular Disease

## 2023-01-10 VITALS — BP 138/80 | HR 57 | Ht 67.0 in | Wt 239.5 lb

## 2023-01-10 DIAGNOSIS — E785 Hyperlipidemia, unspecified: Secondary | ICD-10-CM | POA: Diagnosis not present

## 2023-01-10 DIAGNOSIS — R072 Precordial pain: Secondary | ICD-10-CM | POA: Diagnosis not present

## 2023-01-10 DIAGNOSIS — R0602 Shortness of breath: Secondary | ICD-10-CM | POA: Diagnosis not present

## 2023-01-10 MED ORDER — METOPROLOL TARTRATE 25 MG PO TABS: ORAL_TABLET | ORAL | 0 refills | Status: DC

## 2023-01-10 NOTE — Progress Notes (Signed)
Cardiology Office Note   Date:  01/10/2023   ID:  Morgan Mcclain, DOB March 18, 1951, MRN [id number]  PCP:  Smitty Cords, DO  Cardiologist:   Lorine Bears, MD   Chief Complaint  Patient presents with   New Patient (Initial Visit)    Ref due to Chest pain, fatigue C/o buttock soreness. Meds reviewed verbally with pt.      History of Present Illness: Morgan Mcclain is a 72 y.o. female who was referred for evaluation of chest pain.  She has no prior cardiac history. She has known history of prediabetes, elevated blood pressure, obesity and hyperlipidemia.  She is not a smoker.  Her mother had CABG and ultimately died of congestive heart failure. The patient reports intermittent episodes of substernal chest pain and tightness that started about 2 months ago.  She had about 5 episodes described as heavy feeling when she is trying to hold things.  The episodes lasted for few minutes.  She did notice increased shortness of breath and fatigue.  She was seen by her primary care physician recently and had an EKG done which showed anterolateral T wave changes.  Thus, she was sent to the ED for evaluation.  Her troponin was normal.   Past Medical History:  Diagnosis Date   Arthritis    Left ureteral calculus    Psoriasis SEVERE--  RECEIVES LASE TX'S TWICE WEEKLY   Tremfya   Renal calculus, left    Sleep apnea 02/23/2015   No longer on CPAP    Past Surgical History:  Procedure Laterality Date   ABDOMINAL HYSTERECTOMY  1994   W/ UNILATERAL SALPINGOOPHORECTOMY   BREAST BIOPSY Right 07/14/2019   Affirm Bx- Coil clip-    FIBROADENOMATOID CHANGE    BREAST BIOPSY Left 07/14/2019   Affirm Bx- X Clip-   FIBROADENOMATOID CHANGE    COLONOSCOPY WITH PROPOFOL N/A 03/04/2015   Procedure: COLONOSCOPY WITH PROPOFOL;  Surgeon: Midge Minium, MD;  [facility];  Service: Endoscopy;  Laterality: N/A;   CYSTOSCOPY WITH RETROGRADE PYELOGRAM, URETEROSCOPY AND STENT PLACEMENT  Left 12/25/2012   Procedure: CYSTOSCOPY WITH RETROGRADE PYELOGRAM, URETEROSCOPY AND STENT PLACEMENT;  Surgeon: Sebastian Ache, MD;  Location: [facility];  Service: Urology;  Laterality: Left;  90 MIN NEEDS DIG URETEROSCOPE    HOLMIUM LASER APPLICATION Left 12/25/2012   Procedure: HOLMIUM LASER APPLICATION;  Surgeon: Sebastian Ache, MD;  Location: [facility];  Service: Urology;  Laterality: Left;   TONSILLECTOMY  AGE 54     Current Outpatient Medications  Medication Sig Dispense Refill   aspirin 325 MG tablet Take 325 mg by mouth daily.     Cholecalciferol (VITAMIN D3) 50 MCG (2000 UT) TABS Take 2,000 Units by mouth daily.     hydrocortisone 2.5 % cream Apply to affected areas 1-2 times daily as needed for itch. 60 g 2   ketoconazole (NIZORAL) 2 % cream Apply 1-2 times daily to body folds prn flares. 60 g 3   metoprolol tartrate (LOPRESSOR) 25 MG tablet Take one tablet two hours prior to the test. 1 tablet 0   rosuvastatin (CRESTOR) 10 MG tablet Take 1 tablet (10 mg total) by mouth at bedtime. 90 tablet 3   TREMFYA 100 MG/ML SOSY Inject 100 mg subcutaneously every 8 weeks as directed. 1 mL 2   vitamin B-12 (CYANOCOBALAMIN) 1000 MCG tablet Take 1,000 mcg by mouth daily.     Current Facility-Administered Medications  Medication Dose Route Frequency Provider  Last Rate Last Admin   aspirin EC tablet 325 mg  325 mg Oral Daily Smitty Cords, DO   325 mg at 01/07/23 1155    Allergies:   Lamisil [terbinafine] and Shellfish allergy    Social History:  The patient  reports that she has never smoked. She has never used smokeless tobacco. She reports that she does not drink alcohol and does not use drugs.   Family History:  The patient's family history includes Arthritis in her mother; Cancer in her father; Heart attack (age of onset: 42) in her mother; Heart disease in her mother; Heart failure in her mother.    ROS:  Please see the history of present  illness.   Otherwise, review of systems are positive for none.   All other systems are reviewed and negative.    PHYSICAL EXAM: VS:  BP 138/80 (BP Location: Right Arm, Patient Position: Sitting, Cuff Size: Large)   Pulse (!) 57   Ht 5\' 7"  (1.702 m)   Wt 239 lb 8 oz (108.6 kg)   SpO2 96%   BMI 37.51 kg/m  , BMI Body mass index is 37.51 kg/m. GEN: Well nourished, well developed, in no acute distress  HEENT: normal  Neck: no JVD, carotid bruits, or masses Cardiac: RRR; no murmurs, rubs, or gallops,no edema  Respiratory:  clear to auscultation bilaterally, normal work of breathing GI: soft, nontender, nondistended, + BS MS: no deformity or atrophy  Skin: warm and dry, no rash Neuro:  Strength and sensation are intact Psych: euthymic mood, full affect   EKG:  EKG is ordered today. The ekg ordered today demonstrates sinus bradycardia with no significant ST or T wave changes.   Recent Labs: 10/29/2022: ALT 9; TSH 1.16 01/07/2023: BUN 11; Creatinine, Ser 0.65; Hemoglobin 13.3; Platelets 193; Potassium 3.5; Sodium 139    Lipid Panel    Component Value Date/Time   CHOL 206 (H) 10/29/2022 0931   TRIG 101 10/29/2022 0931   HDL 47 (L) 10/29/2022 0931   CHOLHDL 4.4 10/29/2022 0931   VLDL 21 06/04/2016 1034   LDLCALC 139 (H) 10/29/2022 0931      Wt Readings from Last 3 Encounters:  01/10/23 239 lb 8 oz (108.6 kg)  01/07/23 242 lb 8.1 oz (110 kg)  01/07/23 243 lb (110.2 kg)          01/10/2023   11:51 AM  PAD Screen  Previous PAD dx? Yes  Previous surgical procedure? No  Pain with walking? Yes  Subsides with rest? No  Feet/toe relief with dangling? No  Painful, non-healing ulcers? No  Extremities discolored? No      ASSESSMENT AND PLAN:  1.  Precordial pain: Concerning for cardiac etiology considering her risk factors and recent anterolateral T wave changes on EKG.  Fortunately, her ER evaluation was unremarkable with negative troponin.  I recommend further ischemic  risk stratification with cardiac CTA.  Continue aspirin for now.  2.  Exertional dyspnea and fatigue: I requested an echocardiogram to evaluate LV systolic function.  3.  Elevated blood pressure without history of hypertension: Blood pressure is borderline elevated.  Continue to monitor and will consider adding treatment if needed.  4.  Hyperlipidemia: Recent lipid profile showed an LDL of 139.  She was started on rosuvastatin 10 mg daily.     Disposition:   FU after cardiac testing.  Signed,  Lorine Bears, MD  01/10/2023 12:44 PM    [facility]

## 2023-01-10 NOTE — Patient Instructions (Signed)
Medication Instructions:  No changes *If you need a refill on your cardiac medications before your next appointment, please call your pharmacy*   Lab Work: No changes If you have labs (blood work) drawn today and your tests are completely normal, you will receive your results only by: MyChart Message (if you have MyChart) OR A paper copy in the mail If you have any lab test that is abnormal or we need to change your treatment, we will call you to review the results.   Testing/Procedures: Your physician has requested that you have an echocardiogram. Echocardiography is a painless test that uses sound waves to create images of your heart. It provides your doctor with information about the size and shape of your heart and how well your heart's chambers and valves are working.   You may receive an ultrasound enhancing agent through an IV if needed to better visualize your heart during the echo. This procedure takes approximately one hour.  There are no restrictions for this procedure.  This will take place at [address] [address], [place] [address]    Follow-Up: At [facility], you and your health needs are our priority.  As part of our continuing mission to provide you with exceptional heart care, we have created designated Provider Care Teams.  These Care Teams include your primary Cardiologist (physician) and Advanced Practice Providers (APPs -  Physician Assistants and Nurse Practitioners) who all work together to provide you with the care you need, when you need it.  We recommend signing up for the patient portal called "MyChart".  Sign up information is provided on this After Visit Summary.  MyChart is used to connect with patients for Virtual Visits (Telemedicine).  Patients are able to view lab/test results, encounter notes, upcoming appointments, etc.  Non-urgent messages can be sent to your provider as well.   To learn more about what you can do  with MyChart, go to [email or web address].    Your next appointment:   Follow up with Dr. Kirke Corin or APP after testing  Other Instructions   Your cardiac CT will be scheduled at one of the below locations:   [facility] [address] [place], [place] [address] [phone]  OR  [facility] [address] [place], [place] [address] [phone]  OR   [facility] [address] [place], [place] [address] [phone]  If scheduled at [facility], please arrive at the [facility] and Children's Entrance (Entrance C2) of [facility] 30 minutes prior to test start time. You can use the FREE valet parking offered at entrance C (encouraged to control the heart rate for the test)  Proceed to the [facility] Radiology Department (first floor) to check-in and test prep.  All radiology patients and guests should use entrance C2 at [facility], accessed from [facility], even though the hospital's physical address listed is [address].    If scheduled at [facility] or [facility], please arrive 15 mins early for check-in and test prep.   Please follow these instructions carefully (unless otherwise directed):  On the Night Before the Test: Be sure to Drink plenty of water. Do not consume any caffeinated/decaffeinated beverages or chocolate 12 hours prior to your test. Do not take any antihistamines 12 hours prior to your test.  On the Day of the Test: Drink plenty of water until  1 hour prior to the test. Do not eat any food 1 hour prior to test. You may take your regular medications prior to the test.  Take metoprolol (Lopressor) two hours prior to test. FEMALES- please wear underwire-free bra if available, avoid dresses & tight clothing       After the Test: Drink plenty of water. After receiving IV  contrast, you may experience a mild flushed feeling. This is normal. On occasion, you may experience a mild rash up to 24 hours after the test. This is not dangerous. If this occurs, you can take Benadryl 25 mg and increase your fluid intake. If you experience trouble breathing, this can be serious. If it is severe call 911 IMMEDIATELY. If it is mild, please call our office. If you take any of these medications: Glipizide/Metformin, Avandament, Glucavance, please do not take 48 hours after completing test unless otherwise instructed.  We will call to schedule your test 2-4 weeks out understanding that some insurance companies will need an authorization prior to the service being performed.   For non-scheduling related questions, please contact the cardiac imaging nurse navigator should you have any questions/concerns: Rockwell Alexandria, Cardiac Imaging Nurse Navigator Larey Brick, Cardiac Imaging Nurse Navigator [facility] Direct Office Dial: [phone]   For scheduling needs, including cancellations and rescheduling, please call [place], [phone].

## 2023-01-22 ENCOUNTER — Encounter: Payer: Self-pay | Admitting: Family Medicine

## 2023-01-28 DIAGNOSIS — M9901 Segmental and somatic dysfunction of cervical region: Secondary | ICD-10-CM | POA: Diagnosis not present

## 2023-01-28 DIAGNOSIS — M9902 Segmental and somatic dysfunction of thoracic region: Secondary | ICD-10-CM | POA: Diagnosis not present

## 2023-01-28 DIAGNOSIS — M542 Cervicalgia: Secondary | ICD-10-CM | POA: Diagnosis not present

## 2023-01-28 DIAGNOSIS — M546 Pain in thoracic spine: Secondary | ICD-10-CM | POA: Diagnosis not present

## 2023-02-06 ENCOUNTER — Telehealth (HOSPITAL_COMMUNITY): Payer: Self-pay | Admitting: Emergency Medicine

## 2023-02-06 ENCOUNTER — Encounter (HOSPITAL_COMMUNITY): Payer: Self-pay

## 2023-02-06 NOTE — Telephone Encounter (Signed)
Reaching out to patient to offer assistance regarding upcoming cardiac imaging study; pt verbalizes understanding of appt date/time, parking situation and where to check in, pre-test NPO status and medications ordered, and verified current allergies; name and call back number provided for further questions should they arise Letonia Stead RN Navigator Cardiac Imaging [facility] [phone] office [phone] cell 

## 2023-02-07 ENCOUNTER — Ambulatory Visit
Admission: RE | Admit: 2023-02-07 | Discharge: 2023-02-07 | Disposition: A | Discharge status: Home / Self Care | Payer: Medicare PPO | Source: Ambulatory Visit | Attending: Cardiovascular Disease | Admitting: Cardiovascular Disease

## 2023-02-07 DIAGNOSIS — R072 Precordial pain: Secondary | ICD-10-CM

## 2023-02-07 MED ORDER — METOPROLOL TARTRATE 5 MG/5ML IV SOLN: 5.0000 mg | Freq: Once | INTRAVENOUS | Status: DC

## 2023-02-07 MED ORDER — NITROGLYCERIN 0.4 MG SL SUBL
0.8000 mg | SUBLINGUAL_TABLET | Freq: Once | SUBLINGUAL | Status: AC
  Administered 2023-02-07: 0.8 mg via SUBLINGUAL

## 2023-02-07 MED ORDER — IOHEXOL 350 MG/ML SOLN
100.0000 mL | Freq: Once | INTRAVENOUS | Status: AC | PRN
  Administered 2023-02-07: 100 mL via INTRAVENOUS

## 2023-02-07 NOTE — Progress Notes (Signed)
Patient tolerated procedure well. Ambulate w/o difficulty. Denies light headedness or being dizzy. Sitting in chair drinking water provided. Encouraged to drink extra water today and reasoning explained. Verbalized understanding. All questions answered. ABC intact. No further needs. Discharge from procedure area w/o issues.   °

## 2023-02-25 DIAGNOSIS — M546 Pain in thoracic spine: Secondary | ICD-10-CM | POA: Diagnosis not present

## 2023-02-25 DIAGNOSIS — M9902 Segmental and somatic dysfunction of thoracic region: Secondary | ICD-10-CM | POA: Diagnosis not present

## 2023-02-25 DIAGNOSIS — M9901 Segmental and somatic dysfunction of cervical region: Secondary | ICD-10-CM | POA: Diagnosis not present

## 2023-02-25 DIAGNOSIS — M542 Cervicalgia: Secondary | ICD-10-CM | POA: Diagnosis not present

## 2023-03-01 ENCOUNTER — Ambulatory Visit: Payer: Medicare PPO | Attending: Cardiovascular Disease

## 2023-03-01 DIAGNOSIS — R0602 Shortness of breath: Secondary | ICD-10-CM | POA: Diagnosis not present

## 2023-03-01 LAB — ECHOCARDIOGRAM COMPLETE
AR max vel: 2.89 cm2
AV Area VTI: 2.84 cm2
AV Area mean vel: 2.81 cm2
AV Mean grad: 2 mmHg
AV Peak grad: 4.3 mmHg
Ao pk vel: 1.04 m/s
Area-P 1/2: 2.99 cm2
Calc EF: 53.5 %
S' Lateral: 3.1 cm
Single Plane A2C EF: 54.1 %
Single Plane A4C EF: 52.9 %

## 2023-03-07 ENCOUNTER — Ambulatory Visit: Payer: Medicare PPO

## 2023-03-07 DIAGNOSIS — L400 Psoriasis vulgaris: Secondary | ICD-10-CM | POA: Diagnosis not present

## 2023-03-07 MED ORDER — GUSELKUMAB 100 MG/ML ~~LOC~~ SOSY
100.0000 mg | PREFILLED_SYRINGE | Freq: Once | SUBCUTANEOUS | Status: AC
Start: 2023-03-07 — End: 2023-03-07
  Administered 2023-03-07: 100 mg via SUBCUTANEOUS

## 2023-03-07 NOTE — Progress Notes (Signed)
Patient here today for 8 week Tremfya injection for Psoriasis Vulgaris.    Tremfya 100mg  injected into left lower abdomen. Patient tolerated well.   LOT: [id number] EXP: 04/2024 [id number]   Dorathy Daft, RMA

## 2023-03-12 ENCOUNTER — Other Ambulatory Visit: Payer: Self-pay | Admitting: *Deleted

## 2023-03-12 ENCOUNTER — Encounter: Payer: Self-pay | Admitting: *Deleted

## 2023-03-12 DIAGNOSIS — E782 Mixed hyperlipidemia: Secondary | ICD-10-CM

## 2023-03-12 MED ORDER — ROSUVASTATIN CALCIUM 20 MG PO TABS
20.0000 mg | ORAL_TABLET | Freq: Every day | ORAL | 3 refills | Status: DC
Start: 2023-03-12 — End: 2024-04-23

## 2023-03-12 MED ORDER — AMLODIPINE BESYLATE 2.5 MG PO TABS: 2.5000 mg | ORAL_TABLET | Freq: Every day | ORAL | 3 refills | Status: DC

## 2023-04-09 ENCOUNTER — Other Ambulatory Visit: Payer: Self-pay | Admitting: Dermatology

## 2023-04-09 DIAGNOSIS — L409 Psoriasis, unspecified: Secondary | ICD-10-CM

## 2023-04-11 ENCOUNTER — Ambulatory Visit: Payer: Medicare PPO | Attending: Cardiovascular Disease | Admitting: Cardiovascular Disease

## 2023-04-11 ENCOUNTER — Encounter: Payer: Self-pay | Admitting: Cardiovascular Disease

## 2023-04-11 VITALS — BP 104/70 | HR 58 | Ht 66.0 in | Wt 236.2 lb

## 2023-04-11 DIAGNOSIS — I25118 Atherosclerotic heart disease of native coronary artery with other forms of angina pectoris: Secondary | ICD-10-CM

## 2023-04-11 DIAGNOSIS — I10 Essential (primary) hypertension: Secondary | ICD-10-CM | POA: Diagnosis not present

## 2023-04-11 DIAGNOSIS — E782 Mixed hyperlipidemia: Secondary | ICD-10-CM | POA: Diagnosis not present

## 2023-04-11 NOTE — Patient Instructions (Signed)
Medication Instructions:  No changes *If you need a refill on your cardiac medications before your next appointment, please call your pharmacy*   Lab Work: Your provider would like for you to have following labs drawn today Lipid and Liver.   If you have labs (blood work) drawn today and your tests are completely normal, you will receive your results only by: MyChart Message (if you have MyChart) OR A paper copy in the mail If you have any lab test that is abnormal or we need to change your treatment, we will call you to review the results.   Testing/Procedures: None ordered   Follow-Up: At [facility], you and your health needs are our priority.  As part of our continuing mission to provide you with exceptional heart care, we have created designated Provider Care Teams.  These Care Teams include your primary Cardiologist (physician) and Advanced Practice Providers (APPs -  Physician Assistants and Nurse Practitioners) who all work together to provide you with the care you need, when you need it.  We recommend signing up for the patient portal called "MyChart".  Sign up information is provided on this After Visit Summary.  MyChart is used to connect with patients for Virtual Visits (Telemedicine).  Patients are able to view lab/test results, encounter notes, upcoming appointments, etc.  Non-urgent messages can be sent to your provider as well.   To learn more about what you can do with MyChart, go to [email or web address].    Your next appointment:   6 month(s)  Provider:   You may see Dr. Kirke Corin or one of the following Advanced Practice Providers on your designated Care Team:   Nicolasa Ducking, NP Eula Listen, PA-C Cadence Fransico Michael, PA-C Charlsie Quest, NP    Heart-Healthy Eating Plan Many factors influence your heart health, including eating and exercise habits. Heart health is also called coronary health. Coronary risk increases with abnormal blood fat (lipid) levels. A  heart-healthy eating plan includes limiting unhealthy fats, increasing healthy fats, limiting salt (sodium) intake, and making other diet and lifestyle changes. What is my plan? Your health care provider may recommend that: You limit your fat intake to _________% or less of your total calories each day. You limit your saturated fat intake to _________% or less of your total calories each day. You limit the amount of cholesterol in your diet to less than _________ mg per day. You limit the amount of sodium in your diet to less than _________ mg per day. What are tips for following this plan? Cooking Cook foods using methods other than frying. Baking, boiling, grilling, and broiling are all good options. Other ways to reduce fat include: Removing the skin from poultry. Removing all visible fats from meats. Steaming vegetables in water or broth. Meal planning  At meals, imagine dividing your plate into fourths: Fill one-half of your plate with vegetables and green salads. Fill one-fourth of your plate with whole grains. Fill one-fourth of your plate with lean protein foods. Eat 2-4 cups of vegetables per day. One cup of vegetables equals 1 cup (91 g) broccoli or cauliflower florets, 2 medium carrots, 1 large bell pepper, 1 large sweet potato, 1 large tomato, 1 medium white potato, 2 cups (150 g) raw leafy greens. Eat 1-2 cups of fruit per day. One cup of fruit equals 1 small apple, 1 large banana, 1 cup (237 g) mixed fruit, 1 large orange,  cup (82 g) dried fruit, 1 cup (240 mL) 100% fruit juice. Eat more  foods that contain soluble fiber. Examples include apples, broccoli, carrots, beans, peas, and barley. Aim to get 25-30 g of fiber per day. Increase your consumption of legumes, nuts, and seeds to 4-5 servings per week. One serving of dried beans or legumes equals  cup (90 g) cooked, 1 serving of nuts is  oz (12 almonds, 24 pistachios, or 7 walnut halves), and 1 serving of seeds equals  oz  (8 g). Fats Choose healthy fats more often. Choose monounsaturated and polyunsaturated fats, such as olive and canola oils, avocado oil, flaxseeds, walnuts, almonds, and seeds. Eat more omega-3 fats. Choose salmon, mackerel, sardines, tuna, flaxseed oil, and ground flaxseeds. Aim to eat fish at least 2 times each week. Check food labels carefully to identify foods with trans fats or high amounts of saturated fat. Limit saturated fats. These are found in animal products, such as meats, butter, and cream. Plant sources of saturated fats include palm oil, palm kernel oil, and coconut oil. Avoid foods with partially hydrogenated oils in them. These contain trans fats. Examples are stick margarine, some tub margarines, cookies, crackers, and other baked goods. Avoid fried foods. General information Eat more home-cooked food and less restaurant, buffet, and fast food. Limit or avoid alcohol. Limit foods that are high in added sugar and simple starches such as foods made using white refined flour (white breads, pastries, sweets). Lose weight if you are overweight. Losing just 5-10% of your body weight can help your overall health and prevent diseases such as diabetes and heart disease. Monitor your sodium intake, especially if you have high blood pressure. Talk with your health care provider about your sodium intake. Try to incorporate more vegetarian meals weekly. What foods should I eat? Fruits All fresh, canned (in natural juice), or frozen fruits. Vegetables Fresh or frozen vegetables (raw, steamed, roasted, or grilled). Green salads. Grains Most grains. Choose whole wheat and whole grains most of the time. Rice and pasta, including brown rice and pastas made with whole wheat. Meats and other proteins Lean, well-trimmed beef, veal, pork, and lamb. Chicken and [place] without skin. All fish and shellfish. Wild duck, rabbit, pheasant, and venison. Egg whites or low-cholesterol egg substitutes. Dried  beans, peas, lentils, and tofu. Seeds and most nuts. Dairy Low-fat or nonfat cheeses, including ricotta and mozzarella. Skim or 1% milk (liquid, powdered, or evaporated). Buttermilk made with low-fat milk. Nonfat or low-fat yogurt. Fats and oils Non-hydrogenated (trans-free) margarines. Vegetable oils, including soybean, sesame, sunflower, olive, avocado, peanut, safflower, corn, canola, and cottonseed. Salad dressings or mayonnaise made with a vegetable oil. Beverages Water (mineral or sparkling). Coffee and tea. Unsweetened ice tea. Diet beverages. Sweets and desserts Sherbet, gelatin, and fruit ice. Small amounts of dark chocolate. Limit all sweets and desserts. Seasonings and condiments All seasonings and condiments. The items listed above may not be a complete list of foods and beverages you can eat. Contact a dietitian for more options. What foods should I avoid? Fruits Canned fruit in heavy syrup. Fruit in cream or butter sauce. Fried fruit. Limit coconut. Vegetables Vegetables cooked in cheese, cream, or butter sauce. Fried vegetables. Grains Breads made with saturated or trans fats, oils, or whole milk. Croissants. Sweet rolls. Donuts. High-fat crackers, such as cheese crackers and chips. Meats and other proteins Fatty meats, such as hot dogs, ribs, sausage, bacon, rib-eye roast or steak. High-fat deli meats, such as salami and bologna. Caviar. Domestic duck and goose. Organ meats, such as liver. Dairy Cream, sour cream, cream cheese, and  creamed cottage cheese. Whole-milk cheeses. Whole or 2% milk (liquid, evaporated, or condensed). Whole buttermilk. Cream sauce or high-fat cheese sauce. Whole-milk yogurt. Fats and oils Meat fat, or shortening. Cocoa butter, hydrogenated oils, palm oil, coconut oil, palm kernel oil. Solid fats and shortenings, including bacon fat, salt pork, lard, and butter. Nondairy cream substitutes. Salad dressings with cheese or sour cream. Beverages Regular  sodas and any drinks with added sugar. Sweets and desserts Frosting. Pudding. Cookies. Cakes. Pies. Milk chocolate or white chocolate. Buttered syrups. Full-fat ice cream or ice cream drinks. The items listed above may not be a complete list of foods and beverages to avoid. Contact a dietitian for more information. Summary Heart-healthy meal planning includes limiting unhealthy fats, increasing healthy fats, limiting salt (sodium) intake and making other diet and lifestyle changes. Lose weight if you are overweight. Losing just 5-10% of your body weight can help your overall health and prevent diseases such as diabetes and heart disease. Focus on eating a balance of foods, including fruits and vegetables, low-fat or nonfat dairy, lean protein, nuts and legumes, whole grains, and heart-healthy oils and fats. This information is not intended to replace advice given to you by your health care provider. Make sure you discuss any questions you have with your health care provider. Document Revised: 09/18/2021 Document Reviewed: 09/18/2021 Elsevier Patient Education  2024 [employer].

## 2023-04-11 NOTE — Progress Notes (Signed)
Cardiology Office Note   Date:  04/11/2023   ID:  Morgan Mcclain, DOB [birth year], MRN [id number]  PCP:  Smitty Cords, DO  Cardiologist:   Lorine Bears, MD   Chief Complaint  Patient presents with   Follow-up    F/u echo/CT. Meds reviewed verbally with pt.      History of Present Illness: Morgan Mcclain is a 72 y.o. female who is here today for a follow-up visit regarding chest pain and to discuss cardiovascular testing.   She has known history of prediabetes, elevated blood pressure, obesity and hyperlipidemia.  She is not a smoker.  Her mother had CABG and ultimately died of congestive heart failure. She was seen recently for intermittent episodes of substernal chest pain and tightness. She had about 5 episodes described as heavy feeling when she was trying to hold things.  The episodes lasted for few minutes.  She did notice increased shortness of breath and fatigue.  Her EKG showed anterolateral T wave changes suggestive of ischemia.  ED evaluation revealed normal troponin.    Cardiac CTA was performed which showed a calcium score of 2499 with mild nonobstructive three-vessel coronary artery disease.  There was a small hiatal hernia.  Echocardiogram showed normal LV systolic function with mild mitral regurgitation.  She reports no recurrent episodes of chest pain.  Past Medical History:  Diagnosis Date   Arthritis    Left ureteral calculus    Psoriasis SEVERE--  RECEIVES LASE TX'S TWICE WEEKLY   Tremfya   Renal calculus, left    Sleep apnea 02/23/2015   No longer on CPAP    Past Surgical History:  Procedure Laterality Date   ABDOMINAL HYSTERECTOMY  1994   W/ UNILATERAL SALPINGOOPHORECTOMY   BREAST BIOPSY Right 07/14/2019   Affirm Bx- Coil clip-    FIBROADENOMATOID CHANGE    BREAST BIOPSY Left 07/14/2019   Affirm Bx- X Clip-   FIBROADENOMATOID CHANGE    COLONOSCOPY WITH PROPOFOL N/A 03/04/2015   Procedure: COLONOSCOPY WITH PROPOFOL;  Surgeon: Midge Minium, MD;  Location: [facility];  Service: Endoscopy;  Laterality: N/A;   CYSTOSCOPY WITH RETROGRADE PYELOGRAM, URETEROSCOPY AND STENT PLACEMENT Left 12/25/2012   Procedure: CYSTOSCOPY WITH RETROGRADE PYELOGRAM, URETEROSCOPY AND STENT PLACEMENT;  Surgeon: Sebastian Ache, MD;  Location: [facility];  Service: Urology;  Laterality: Left;  90 MIN NEEDS DIG URETEROSCOPE    HOLMIUM LASER APPLICATION Left 12/25/2012   Procedure: HOLMIUM LASER APPLICATION;  Surgeon: Sebastian Ache, MD;  [facility]: [facility];  Service: Urology;  Laterality: Left;   TONSILLECTOMY  AGE 55     Current Outpatient Medications  Medication Sig Dispense Refill   amLODipine (NORVASC) 2.5 MG tablet Take 1 tablet (2.5 mg total) by mouth daily. 90 tablet 3   aspirin 325 MG tablet Take 325 mg by mouth daily.     Cholecalciferol (VITAMIN D3) 50 MCG (2000 UT) TABS Take 2,000 Units by mouth daily.     hydrocortisone 2.5 % cream Apply to affected areas 1-2 times daily as needed for itch. 60 g 2   ketoconazole (NIZORAL) 2 % cream Apply 1-2 times daily to body folds prn flares. 60 g 3   rosuvastatin (CRESTOR) 20 MG tablet Take 1 tablet (20 mg total) by mouth at bedtime. 90 tablet 3   TREMFYA 100 MG/ML prefilled syringe INJECT 1 SYRINGE UNDER THE SKIN EVERY 8 WEEKS 1 mL 1   vitamin B-12 (CYANOCOBALAMIN) 1000 MCG tablet Take 1,000 mcg  by mouth daily.     Current Facility-Administered Medications  Medication Dose Route Frequency Provider Last Rate Last Admin   aspirin EC tablet 325 mg  325 mg Oral Daily Smitty Cords, DO   325 mg at 01/07/23 1155    Allergies:   Lamisil [terbinafine] and Shellfish allergy    Social History:  The patient  reports that she has never smoked. She has never used smokeless tobacco. She reports that she does not drink alcohol and does not use drugs.   Family History:  The patient's family history includes Arthritis in her mother; Cancer in her father; Heart  attack (age of onset: 34) in her mother; Heart disease in her mother; Heart failure in her mother.    ROS:  Please see the history of present illness.   Otherwise, review of systems are positive for none.   All other systems are reviewed and negative.    PHYSICAL EXAM: VS:  BP 104/70 (BP Location: Left Arm, Patient Position: Sitting, Cuff Size: Large)   Pulse (!) 58   Ht 5\' 6"  (1.676 m)   Wt 236 lb 4 oz (107.2 kg)   SpO2 99%   BMI 38.13 kg/m  , BMI Body mass index is 38.13 kg/m. GEN: Well nourished, well developed, in no acute distress  HEENT: normal  Neck: no JVD, carotid bruits, or masses Cardiac: RRR; no murmurs, rubs, or gallops, mild bilateral leg edema Respiratory:  clear to auscultation bilaterally, normal work of breathing GI: soft, nontender, nondistended, + BS MS: no deformity or atrophy  Skin: warm and dry, no rash Neuro:  Strength and sensation are intact Psych: euthymic mood, full affect Dorsalis pedis and posterior tibial are both palpable bilaterally.   EKG:  EKG is ordered today. The ekg ordered today demonstrates : Sinus bradycardia Nonspecific T wave abnormality When compared with ECG of 07-Jan-2023 12:26, No significant change was found    Recent Labs: 10/29/2022: ALT 9; TSH 1.16 01/07/2023: BUN 11; Creatinine, Ser 0.65; Hemoglobin 13.3; Platelets 193; Potassium 3.5; Sodium 139    Lipid Panel    Component Value Date/Time   CHOL 206 (H) 10/29/2022 0931   TRIG 101 10/29/2022 0931   HDL 47 (L) 10/29/2022 0931   CHOLHDL 4.4 10/29/2022 0931   VLDL 21 06/04/2016 1034   LDLCALC 139 (H) 10/29/2022 0931      Wt Readings from Last 3 Encounters:  04/11/23 236 lb 4 oz (107.2 kg)  01/10/23 239 lb 8 oz (108.6 kg)  01/07/23 242 lb 8.1 oz (110 kg)          01/10/2023   11:51 AM  PAD Screen  Previous PAD dx? Yes  Previous surgical procedure? No  Pain with walking? Yes  Subsides with rest? No  Feet/toe relief with dangling? No  Painful, non-healing  ulcers? No  Extremities discolored? No      ASSESSMENT AND PLAN:  1.  Coronary artery disease involving native coronary arteries with other forms of angina: Cardiac CTA showed severely elevated calcium score above 2000 but no evidence of obstructive disease.  I recommend aggressive medical therapy.  Continue low-dose aspirin.  I discussed with her the importance of heart healthy diet and regular exercise. No beta-blocker given baseline bradycardia.  2.  Essential hypertension: Blood pressure was mildly elevated in May.  Since then, I added amlodipine 2.5 mg once daily for its antianginal effect.  Blood pressure is improved today.  She does have chronic lower extremity edema but that has been  stable.  If there is worsening in the future, recommend switching to an ARB.    4.  Hyperlipidemia: She was started on rosuvastatin.  I did increase the dose to 20 mg daily after her CTA results.  However, she continues to take 10 mg daily.  Will check lipid and liver profile today and likely increase the dose to try to get her LDL below 55.     Disposition:   FU in 6 months.  Signed,  Lorine Bears, MD  04/11/2023 9:33 AM    [facility]

## 2023-04-12 LAB — HEPATIC FUNCTION PANEL
ALT: 10 IU/L (ref 0–32)
AST: 21 IU/L (ref 0–40)
Albumin: 4.2 g/dL (ref 3.8–4.8)
Alkaline Phosphatase: 116 IU/L (ref 44–121)
Bilirubin Total: 0.4 mg/dL (ref 0.0–1.2)
Bilirubin, Direct: 0.13 mg/dL (ref 0.00–0.40)
Total Protein: 7.5 g/dL (ref 6.0–8.5)

## 2023-04-12 LAB — LIPID PANEL
Chol/HDL Ratio: 2.8 ratio (ref 0.0–4.4)
Cholesterol, Total: 136 mg/dL (ref 100–199)
HDL: 49 mg/dL (ref >39)
LDL Chol Calc (NIH): 73 mg/dL (ref 0–99)
Triglycerides: 69 mg/dL (ref 0–149)
VLDL Cholesterol Cal: 14 mg/dL (ref 5–40)

## 2023-05-07 ENCOUNTER — Ambulatory Visit: Payer: Medicare PPO | Admitting: Dermatology

## 2023-05-07 DIAGNOSIS — L409 Psoriasis, unspecified: Secondary | ICD-10-CM

## 2023-05-07 DIAGNOSIS — L810 Postinflammatory hyperpigmentation: Secondary | ICD-10-CM | POA: Diagnosis not present

## 2023-05-07 DIAGNOSIS — Z7189 Other specified counseling: Secondary | ICD-10-CM

## 2023-05-07 DIAGNOSIS — Z79899 Other long term (current) drug therapy: Secondary | ICD-10-CM

## 2023-05-07 MED ORDER — HYDROQUINONE 4 % EX CREA: TOPICAL_CREAM | CUTANEOUS | 2 refills | Status: AC

## 2023-05-07 MED ORDER — GUSELKUMAB 100 MG/ML ~~LOC~~ SOSY
100.0000 mg | PREFILLED_SYRINGE | SUBCUTANEOUS | Status: AC
Start: 2023-05-07 — End: 2023-09-05
  Administered 2023-05-07 – 2023-09-05 (×3): 100 mg via SUBCUTANEOUS

## 2023-05-07 NOTE — Patient Instructions (Addendum)
Recommend daily broad spectrum sunscreen SPF 30+ to sun-exposed areas, reapply every 2 hours as needed. Call for new or changing lesions.  Staying in the shade or wearing long sleeves, sun glasses (UVA+UVB protection) and wide brim hats (4-inch brim around the entire circumference of the hat) are also recommended for sun protection.     Hydroquinone 4% cream- apply pea sized amount twice daily to dark spots for up to 3 months. This cannot be used more than 3 months due to risk of exogenous ochronosis (permanent dark spots).      Due to recent changes in healthcare laws, you may see results of your pathology and/or laboratory studies on MyChart before the doctors have had a chance to review them. We understand that in some cases there may be results that are confusing or concerning to you. Please understand that not all results are received at the same time and often the doctors may need to interpret multiple results in order to provide you with the best plan of care or course of treatment. Therefore, we ask that you please give [place] 2 business days to thoroughly review all your results before contacting the office for clarification. Should we see a critical lab result, you will be contacted sooner.   If You Need Anything After Your Visit  If you have any questions or concerns for your doctor, please call our main line at [phone] and press option 4 to reach your doctor's medical assistant. If no one answers, please leave a voicemail as directed and we will return your call as soon as possible. Messages left after 4 pm will be answered the following business day.   You may also send [place] a message via MyChart. We typically respond to MyChart messages within 1-2 business days.  For prescription refills, please ask your pharmacy to contact our office. Our fax number is [phone].  If you have an urgent issue when the clinic is closed that cannot wait until the next business day, you can page your  doctor at the number below.    Please note that while we do our best to be available for urgent issues outside of office hours, we are not available 24/7.   If you have an urgent issue and are unable to reach [place], you may choose to seek medical care at your doctor's office, retail clinic, urgent care center, or emergency room.  If you have a medical emergency, please immediately call 911 or go to the emergency department.  Pager Numbers  - Dr. Gwen Pounds: [phone]  - Dr. Roseanne Reno: [phone]  - Dr. Katrinka Blazing: [phone]   In the event of inclement weather, please call our main line at [phone] for an update on the status of any delays or closures.  Dermatology Medication Tips: Please keep the boxes that topical medications come in in order to help keep track of the instructions about where and how to use these. Pharmacies typically print the medication instructions only on the boxes and not directly on the medication tubes.   If your medication is too expensive, please contact our office at [phone] option 4 or send [place] a message through MyChart.   We are unable to tell what your co-pay for medications will be in advance as this is different depending on your insurance coverage. However, we may be able to find a substitute medication at lower cost or fill out paperwork to get insurance to cover a needed medication.   If a prior authorization is required to  get your medication covered by your insurance company, please allow [place] 1-2 business days to complete this process.  Drug prices often vary depending on where the prescription is filled and some pharmacies may offer cheaper prices.  The website www.goodrx.com contains coupons for medications through different pharmacies. The prices here do not account for what the cost may be with help from insurance (it may be cheaper with your insurance), but the website can give you the price if you did not use any insurance.  - You can print  the associated coupon and take it with your prescription to the pharmacy.  - You may also stop by our office during regular business hours and pick up a GoodRx coupon card.  - If you need your prescription sent electronically to a different pharmacy, notify our office through [facility] or by phone at [phone] option 4.     Si Usted Necesita Algo Despus de Su Visita  Tambin puede enviarnos un mensaje a travs de Clinical cytogeneticist. Por lo general respondemos a los mensajes de MyChart en el transcurso de 1 a 2 das hbiles.  Para renovar recetas, por favor pida a su farmacia que se ponga en contacto con nuestra oficina. Annie Sable de fax es [place] [phone].  Si tiene un asunto urgente cuando la clnica est cerrada y que no puede esperar hasta el siguiente da hbil, puede llamar/localizar a su doctor(a) al nmero que aparece a continuacin.   Por favor, tenga en cuenta que aunque hacemos todo lo posible para estar disponibles para asuntos urgentes fuera del horario de [place], no estamos disponibles las 24 horas del da, los 7 [address]  [address] de la [place].   Si tiene un problema urgente y no puede comunicarse con nosotros, puede optar por buscar atencin mdica  en el consultorio de su doctor(a), en una clnica privada, en un centro de atencin urgente o en una sala de emergencias.  Si tiene Engineer, drilling, por favor llame inmediatamente al 911 o vaya a la sala de emergencias.  Nmeros de bper  - Dr. Gwen Pounds: [phone]  - Dra. Roseanne Reno: [phone]  - Dr. Katrinka Blazing: [phone]   En caso de inclemencias del tiempo, por favor llame a Lacy Duverney principal al [phone] para una actualizacin sobre el [place] de cualquier retraso o cierre.  Consejos para la medicacin en dermatologa: Por favor, guarde las cajas en las que vienen los medicamentos de uso tpico para ayudarle a seguir las instrucciones sobre dnde y cmo usarlos. Las farmacias generalmente imprimen las  instrucciones del medicamento slo en las cajas y no directamente en los tubos del [place].   Si su medicamento es muy caro, por favor, pngase en contacto con Rolm [place]ala llamando al [phone] y presione la opcin 4 o envenos un mensaje a travs de Clinical cytogeneticist.   No podemos decirle cul ser su copago por los medicamentos por adelantado ya que esto es diferente dependiendo de la cobertura de su seguro. Sin embargo, es posible que podamos encontrar un medicamento sustituto a Audiological scientist un formulario para que el seguro cubra el medicamento que se considera necesario.   Si se requiere una autorizacin previa para que su compaa de seguros [place] su medicamento, por favor permtanos de 1 a 2 das hbiles para completar [address]  Los precios de los medicamentos varan con frecuencia dependiendo del Environmental consultant de dnde se surte la receta y alguna farmacias pueden ofrecer precios ms baratos.  El sitio web www.goodrx.com tiene cupones para medicamentos de [employer]  farmacias. Los precios aqu no tienen en cuenta lo que podra costar con la ayuda del seguro (puede ser ms barato con su seguro), pero el sitio web puede darle el precio si no utiliz Tourist information centre manager.  - Puede imprimir el cupn correspondiente y llevarlo con su receta a la farmacia.  - Tambin puede pasar por nuestra oficina durante el horario de atencin regular y Education officer, museum una tarjeta de cupones de GoodRx.  - Si necesita que su receta se enve electrnicamente a una farmacia diferente, informe a nuestra oficina a travs de MyChart de [place] o por telfono llamando al [phone] y presione la opcin 4.

## 2023-05-07 NOTE — Progress Notes (Signed)
Follow-Up Visit   Subjective  Morgan Mcclain is a 72 y.o. female who presents for the following: Psoriasis 6 month follow-up psoriasis of the trunk, extremities. Tremfya sq injections q 8 wks (no side effects, no infection, no injection site reactions) Psoriasis is well-controlled. Occasionally using Ketoconazole cream and Hydrocortisone cream.  Patient c/o dark areas around her left eye and left cheek. No h/o psoriasis or rash in same area. Not itchy. Started Norvasc a month ago, but had pigmentation before that.  No other antiHTNsives.   The following portions of the chart were reviewed this encounter and updated as appropriate: medications, allergies, medical history  Review of Systems:  No other skin or systemic complaints except as noted in HPI or Assessment and Plan.  Objective  Well appearing patient in no apparent distress; mood and affect are within normal limits.  Areas Examined:face, trunk    Relevant exam findings are noted in the Assessment and Plan.   left temple, left lateral cheek, left occular Hyperpigmented patches, no inflammation detected    Assessment & Plan  Postinflammatory hyperpigmentation left temple, left lateral cheek, left occular  post-inflammatory hyperpimentation (PIH)  vs Melasma   Samples of Neutrogena  mineral UV tint medium deep SPF given use once a day in AM Start Hydroquinone 4% cream apply to dark areas on face qd-bid   Hydroquinone 4% cream- apply pea sized amount twice daily to dark spots for up to 3 months. This cannot be used more than 3 months due to risk of exogenous ochronosis (permanent dark spots).  Recommend UV protected sunglasses when outdoors  Psoriasis  Related Medications ketoconazole (NIZORAL) 2 % cream Apply 1-2 times daily to body folds prn flares.  hydrocortisone 2.5 % cream Apply to affected areas 1-2 times daily as needed for itch.  TREMFYA 100 MG/ML prefilled syringe INJECT 1 SYRINGE UNDER THE SKIN EVERY 8  WEEKS  guselkumab (TREMFYA) prefilled syringe 100 mg   Long-term use of high-risk medication       PSORIASIS on systemic treatment inframammary Well demarcated violaceous patch on the right inframammary; general hyperpigmentation of the bilateral inframammary. Rest of body clear   BSA% <1% on Tremfya   Chronic condition with duration or expected duration over one year. Currently well-controlled on Tremfya   Counseling on psoriasis and coordination of care   Psoriasis - severe on systemic "biologic" treatment injections.  Psoriasis is a chronic non-curable, but treatable genetic/hereditary disease that may have other systemic features affecting other organ systems such as joints (Psoriatic Arthritis).  It is linked with heart disease, inflammatory bowel disease, non-alcoholic fatty liver disease, and depression. Significant skin psoriasis and/or psoriatic arthritis may have significant symptoms and affects activities of daily activity and often benefits from systemic "biologic" injection treatments.  These "biologic" treatments have some potential side effects including immunosuppression and require pre-treatment laboratory screening and periodic laboratory monitoring and periodic in person evaluation and monitoring by the attending dermatologist physician (long term medication management).   Labs reviewed  Hep Function panel WNL 04/12/2023 CBC WNL 01/07/2023 TB skin test negative 11/08/2022  Continue Tremfya 100 MG/ML sq injections q 8 weeks.  Reviewed risks of biologics including immunosuppression, infections, injection site reaction, and failure to improve condition. Goal is control of skin condition, not cure.  Some older biologics such as Humira and Enbrel may slightly increase risk of malignancy and may worsen congestive heart failure.  Talz and Cosentyx may cause inflammatory bowel disease to flare. The use of biologics requires long  term medication management, including periodic office  visits and monitoring of blood work.   Tremfya 100mg  injected into right lower abdomen. Patient tolerated well.   LOT: [id number].AE EXP: 05/2024 ZOX:09604-540-98  Cont Ketoconazole 2% cr qd prn flares Cont HC 2.5% cr qd up to 5d/wk prn flares   Topical steroids (such as triamcinolone, fluocinolone, fluocinonide, mometasone, clobetasol, halobetasol, betamethasone, hydrocortisone) can cause thinning and lightening of the skin if they are used for too long in the same area. Your physician has selected the right strength medicine for your problem and area affected on the body. Please use your medication only as directed by your physician to prevent side effects.  Long term medication management.  Patient is using long term (months to years) prescription medication  to control their dermatologic condition.  These medications require periodic monitoring to evaluate for efficacy and side effects and may require periodic laboratory monitoring.   Return in about 6 months (around 11/04/2023) for Psoriasis .  I, Angelique Holm, CMA, am acting as scribe for Willeen Niece, MD .   Documentation: I have reviewed the above documentation for accuracy and completeness, and I agree with the above.  Willeen Niece, MD

## 2023-07-09 ENCOUNTER — Ambulatory Visit: Payer: Medicare PPO

## 2023-07-09 DIAGNOSIS — L409 Psoriasis, unspecified: Secondary | ICD-10-CM

## 2023-07-09 NOTE — Progress Notes (Signed)
Patient here today for 8 week Tremfya injection for Psoriasis Vulgaris.    Tremfya 100mg  injected into left lower abdomen. Patient tolerated well.   LOT: [id number].AH EXP: 07/2024 NDC: 40981-191-47   Dorathy Daft, RMA

## 2023-07-30 ENCOUNTER — Ambulatory Visit: Payer: Medicare PPO

## 2023-07-30 ENCOUNTER — Telehealth: Payer: Self-pay | Admitting: Family Medicine
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
## 2023-07-31 NOTE — Telephone Encounter (Signed)
Attempted to reach patient to schedule an appointment. Left voicemail

## 2023-08-02 NOTE — Telephone Encounter (Signed)
Patient called back, but declined to schedule an appointment.

## 2023-08-03 NOTE — Telephone Encounter (Signed)
Understood. I will keep her request in my inbox for now. If she changes her mind, we can do in person or virtual visit.  As stated, I cannot write her a new updated document regarding her status without documented evaluation, because if any medical records were requested at a later date. We would not have any documentation to support my letter, if she was not seen.  Thank you!  ,Saralyn Pilar, DO [facility] 08/03/2023, 1:11 PM

## 2023-08-05 DIAGNOSIS — M25511 Pain in right shoulder: Secondary | ICD-10-CM | POA: Diagnosis not present

## 2023-08-08 ENCOUNTER — Other Ambulatory Visit: Payer: Self-pay | Admitting: Dermatology

## 2023-08-08 DIAGNOSIS — L409 Psoriasis, unspecified: Secondary | ICD-10-CM

## 2023-09-05 ENCOUNTER — Ambulatory Visit (INDEPENDENT_AMBULATORY_CARE_PROVIDER_SITE_OTHER): Payer: Medicare PPO

## 2023-09-05 DIAGNOSIS — L400 Psoriasis vulgaris: Secondary | ICD-10-CM

## 2023-09-05 NOTE — Progress Notes (Signed)
 Patient here today for 8 week Tremfya injection for Psoriasis Vulgaris.    Tremfya 100mg  injected into right lower abdomen. Patient tolerated well.   LOT: PDSOP.AE EXP: 10/2024 NDC: 16109-604-54   Dorathy Daft, RMA

## 2023-10-01 ENCOUNTER — Other Ambulatory Visit: Payer: Self-pay | Admitting: Family Medicine

## 2023-10-01 DIAGNOSIS — Z1231 Encounter for screening mammogram for malignant neoplasm of breast: Secondary | ICD-10-CM

## 2023-10-16 ENCOUNTER — Ambulatory Visit
Admission: RE | Admit: 2023-10-16 | Discharge: 2023-10-16 | Disposition: A | Discharge status: Home / Self Care | Payer: Medicare PPO | Source: Ambulatory Visit | Attending: Family Medicine | Admitting: Family Medicine

## 2023-10-16 DIAGNOSIS — Z1231 Encounter for screening mammogram for malignant neoplasm of breast: Secondary | ICD-10-CM | POA: Diagnosis not present

## 2023-11-04 ENCOUNTER — Telehealth: Payer: Self-pay

## 2023-11-04 ENCOUNTER — Ambulatory Visit (INDEPENDENT_AMBULATORY_CARE_PROVIDER_SITE_OTHER): Payer: Medicare PPO | Admitting: Dermatology

## 2023-11-04 ENCOUNTER — Other Ambulatory Visit: Payer: Medicare PPO

## 2023-11-04 ENCOUNTER — Encounter: Payer: Self-pay | Admitting: Dermatology

## 2023-11-04 DIAGNOSIS — L409 Psoriasis, unspecified: Secondary | ICD-10-CM | POA: Diagnosis not present

## 2023-11-04 DIAGNOSIS — Z79899 Other long term (current) drug therapy: Secondary | ICD-10-CM

## 2023-11-04 DIAGNOSIS — Z7189 Other specified counseling: Secondary | ICD-10-CM

## 2023-11-04 DIAGNOSIS — E538 Deficiency of other specified B group vitamins: Secondary | ICD-10-CM

## 2023-11-04 DIAGNOSIS — E782 Mixed hyperlipidemia: Secondary | ICD-10-CM

## 2023-11-04 DIAGNOSIS — E559 Vitamin D deficiency, unspecified: Secondary | ICD-10-CM

## 2023-11-04 DIAGNOSIS — R7303 Prediabetes: Secondary | ICD-10-CM

## 2023-11-04 DIAGNOSIS — Z0000 Encounter for general adult medical examination without abnormal findings: Secondary | ICD-10-CM

## 2023-11-04 MED ORDER — ZORYVE 0.3 % EX CREA: 1.0000 | TOPICAL_CREAM | Freq: Every day | CUTANEOUS | 6 refills | Status: DC

## 2023-11-04 MED ORDER — GUSELKUMAB 100 MG/ML ~~LOC~~ SOSY
100.0000 mg | PREFILLED_SYRINGE | SUBCUTANEOUS | Status: AC
Start: 2023-12-23 — End: 2024-03-05
  Administered 2024-01-06 – 2024-03-05 (×2): 100 mg via SUBCUTANEOUS

## 2023-11-04 MED ORDER — GUSELKUMAB 100 MG/ML ~~LOC~~ SOSY
100.0000 mg | PREFILLED_SYRINGE | Freq: Once | SUBCUTANEOUS | Status: AC
Start: 2023-11-04 — End: 2023-11-04
  Administered 2023-11-04: 100 mg via SUBCUTANEOUS

## 2023-11-04 NOTE — Progress Notes (Signed)
 Follow-Up Visit   Subjective  Morgan Mcclain is a 73 y.o. female who presents for the following: Psoriasis 56m f/u, inframammary, abdomen Tremfya 100mg /ml sq injections q 8 wks, Ketoconazole 2% cr prn She has one active area under her right breast.  The following portions of the chart were reviewed this encounter and updated as appropriate: medications, allergies, medical history  Review of Systems:  No other skin or systemic complaints except as noted in HPI or Assessment and Plan.  Objective  Well appearing patient in no apparent distress; mood and affect are within normal limits.   A focused examination was performed of the following areas: Inframammary, abdomen  Relevant exam findings are noted in the Assessment and Plan.    Assessment & Plan   PSORIASIS on Systemic Treatment inframammary Exam: pink white scaly patch R inframammary with surrounding hyperpigmentation  <1% BSA.  Chronic and persistent condition with duration or expected duration over one year. Condition is improving with treatment but not currently at goal.   patient joint pain improved with Tremfya  Labs due, pt did have labs drawn this am at PCP, pt needs Quantiferon TB Gold Plus  Psoriasis - severe on systemic treatment.  Psoriasis is a chronic non-curable, but treatable genetic/hereditary disease that may have other systemic features affecting other organ systems such as joints (Psoriatic Arthritis).  It is linked with heart disease, inflammatory bowel disease, non-alcoholic fatty liver disease, and depression. Significant skin psoriasis and/or psoriatic arthritis may have significant symptoms and affects activities of daily activity and often benefits from systemic treatments.  These systemic treatments have some potential side effects including immunosuppression and require pre-treatment laboratory screening and periodic laboratory monitoring and periodic in person evaluation and monitoring by the attending  dermatologist physician (long term medication management).     Treatment Plan: Cont Tremfya 100mg /ml sq injections q 8 wks here in clinic Tremfya 100mg /ml sq injection to L lower abdomen today, Lot [id number].[facility] 01/2025 Start Zoryve 0.3% cr qd to aa psoriasis inframammary, body folds sample x 2 XAAR-A exp 06/26/2026 Cont Ketoconazole 2% cr qd to body folds in summer  Reviewed risks of biologics including immunosuppression, infections, injection site reaction, and failure to improve condition. Goal is control of skin condition, not cure.  Some older biologics such as Humira and Enbrel may slightly increase risk of malignancy and may worsen congestive heart failure.  Taltz and Cosentyx may cause inflammatory bowel disease to flare. The use of biologics requires long term medication management, including periodic office visits and monitoring of blood work.   Long term medication management.  Patient is using long term (months to years) prescription medication  to control their dermatologic condition.  These medications require periodic monitoring to evaluate for efficacy and side effects and may require periodic laboratory monitoring.     PSORIASIS   Related Procedures QuantiFERON-TB Gold Plus Related Medications ketoconazole (NIZORAL) 2 % cream Apply 1-2 times daily to body folds prn flares. hydrocortisone 2.5 % cream Apply to affected areas 1-2 times daily as needed for itch. TREMFYA 100 MG/ML prefilled syringe INJECT 1 SYRINGE UNDER THE SKIN EVERY 8 WEEKS guselkumab (TREMFYA) prefilled syringe 100 mg  guselkumab (TREMFYA) prefilled syringe 100 mg   Return in about 6 months (around 05/06/2024) for Psoriasis f/u, 8 wks nurse visit for Tremfya injection.  I, Ardis Rowan, RMA, am acting as scribe for Willeen Niece, MD .   Documentation: I have reviewed the above documentation for accuracy and completeness, and I agree with the  above.  Willeen Niece, MD

## 2023-11-04 NOTE — Telephone Encounter (Signed)
 Copied from CRM [id number]. Topic: General - Other >> Nov 04, 2023 10:26 AM Macon Large wrote: Reason for CRM: Sonya with [facility] requests that a QuantiFERON-TB Gold test be added to the lab panel. Sonya requests call back to advise if this can be added. Call back# [phone] Option# 4

## 2023-11-04 NOTE — Patient Instructions (Signed)

## 2023-11-05 LAB — CBC WITH DIFFERENTIAL/PLATELET
Absolute Lymphocytes: 860 {cells}/uL (ref 850–3900)
Absolute Monocytes: 649 {cells}/uL (ref 200–950)
Basophils Absolute: 39 {cells}/uL (ref 0–200)
Basophils Relative: 0.9 %
Eosinophils Absolute: 151 {cells}/uL (ref 15–500)
Eosinophils Relative: 3.5 %
HCT: 42.3 % (ref 35.0–45.0)
Hemoglobin: 13.8 g/dL (ref 11.7–15.5)
MCH: 31.8 pg (ref 27.0–33.0)
MCHC: 32.6 g/dL (ref 32.0–36.0)
MCV: 97.5 fL (ref 80.0–100.0)
MPV: 11.3 fL (ref 7.5–12.5)
Monocytes Relative: 15.1 %
Neutro Abs: 2602 {cells}/uL (ref 1500–7800)
Neutrophils Relative %: 60.5 %
Platelets: 173 10*3/uL (ref 140–400)
RBC: 4.34 10*6/uL (ref 3.80–5.10)
RDW: 11.8 % (ref 11.0–15.0)
Total Lymphocyte: 20 %
WBC: 4.3 10*3/uL (ref 3.8–10.8)

## 2023-11-05 LAB — HEMOGLOBIN A1C
Hgb A1c MFr Bld: 6 %{Hb} — ABNORMAL HIGH (ref <5.7)
Mean Plasma Glucose: 126 mg/dL
eAG (mmol/L): 7 mmol/L

## 2023-11-05 LAB — COMPLETE METABOLIC PANEL WITH GFR
AG Ratio: 1.2 (calc) (ref 1.0–2.5)
ALT: 7 U/L (ref 6–29)
AST: 16 U/L (ref 10–35)
Albumin: 4.1 g/dL (ref 3.6–5.1)
Alkaline phosphatase (APISO): 91 U/L (ref 37–153)
BUN: 10 mg/dL (ref 7–25)
CO2: 29 mmol/L (ref 20–32)
Calcium: 9.5 mg/dL (ref 8.6–10.4)
Chloride: 105 mmol/L (ref 98–110)
Creat: 0.7 mg/dL (ref 0.60–1.00)
Globulin: 3.4 g/dL (ref 1.9–3.7)
Glucose, Bld: 86 mg/dL (ref 65–99)
Potassium: 4.2 mmol/L (ref 3.5–5.3)
Sodium: 139 mmol/L (ref 135–146)
Total Bilirubin: 0.4 mg/dL (ref 0.2–1.2)
Total Protein: 7.5 g/dL (ref 6.1–8.1)
eGFR: 92 mL/min/{1.73_m2} (ref >=60)

## 2023-11-05 LAB — LIPID PANEL
Cholesterol: 138 mg/dL (ref <200)
HDL: 46 mg/dL — ABNORMAL LOW (ref >=50)
LDL Cholesterol (Calc): 76 mg/dL
Non-HDL Cholesterol (Calc): 92 mg/dL (ref <130)
Total CHOL/HDL Ratio: 3 (calc) (ref <5.0)
Triglycerides: 84 mg/dL (ref <150)

## 2023-11-05 LAB — VITAMIN B12: Vitamin B-12: 1307 pg/mL — ABNORMAL HIGH (ref 200–1100)

## 2023-11-05 LAB — VITAMIN D 25 HYDROXY (VIT D DEFICIENCY, FRACTURES): Vit D, 25-Hydroxy: 29 ng/mL — ABNORMAL LOW (ref 30–100)

## 2023-11-05 LAB — TSH: TSH: 1.31 m[IU]/L (ref 0.40–4.50)

## 2023-11-08 ENCOUNTER — Ambulatory Visit: Payer: Medicare PPO

## 2023-11-08 ENCOUNTER — Ambulatory Visit (INDEPENDENT_AMBULATORY_CARE_PROVIDER_SITE_OTHER): Payer: Medicare PPO | Admitting: Family Medicine

## 2023-11-08 ENCOUNTER — Encounter: Payer: Self-pay | Admitting: Family Medicine

## 2023-11-08 VITALS — BP 132/78 | HR 58 | Ht 66.0 in | Wt 237.0 lb

## 2023-11-08 VITALS — BP 142/78 | Ht 66.0 in | Wt 237.2 lb

## 2023-11-08 DIAGNOSIS — Z0000 Encounter for general adult medical examination without abnormal findings: Secondary | ICD-10-CM

## 2023-11-08 DIAGNOSIS — Z23 Encounter for immunization: Secondary | ICD-10-CM | POA: Diagnosis not present

## 2023-11-08 MED ORDER — CONTRAVE 8-90 MG PO TB12: ORAL_TABLET | ORAL | Status: AC

## 2023-11-08 NOTE — Progress Notes (Signed)
 Subjective:    Patient ID: Morgan Mcclain, female    DOB: 1951-01-16, 73 y.o.   MRN: [id number]  Morgan Mcclain is a 73 y.o. female presenting on 11/08/2023 for Annual Exam   HPI  Discussed the use of AI scribe software for clinical note transcription with the patient, who gave verbal consent to proceed.  History of Present Illness   Morgan Mcclain "Morgan Mcclain" is a 73 year old female who presents for an annual physical exam.  She is up to date with her mammogram, completed approximately two and a half weeks ago, and her bone density scan, completed in 2024.  She is currently taking rosuvastatin, which has effectively reduced her LDL cholesterol from 139 to 76. Despite disliking taking medications, she has been compliant. Recent blood work shows stable kidney function, normal liver enzymes, and excellent cholesterol levels. She has mild prediabetes, stable for the past two years. Her vitamin D level is 29, and her vitamin B12 is higher than average. Her thyroid function is normal.  She weighs 237 pounds, a slight decrease from 242 pounds in the spring of 2024. She attributes her weight challenges to dietary choices rather than overeating, noting frequent nighttime eating. She has considered weight loss treatments but is concerned about cost and insurance coverage. She is currently taking Tremfya and is hesitant about adding another injectable medication due to potential side effects.    Psoriatic Arthritis / OA/DJD Followed by Dermatology on medication management. Tremfaya   Health Maintenance: Prevnar-20 today  UTD Colonoscopy 2016, 10 year repeat since entire colon normal negative, no specimens, no fam history, Dr Servando Snare AGI, next in 2026 considering Cologuard instead         11/08/2023    9:43 AM 11/08/2023    9:07 AM 11/02/2022    9:26 AM  Depression screen PHQ 2/9  Decreased Interest 1 0 0  Down, Depressed, Hopeless 0 0 0  PHQ - 2 Score 1 0 0  Altered sleeping 1 0 0  Tired,  decreased energy 2 2 0  Change in appetite 1 0 0  Feeling bad or failure about yourself  0 0 0  Trouble concentrating 0 0 0  Moving slowly or fidgety/restless 0 0 0  Suicidal thoughts 0 0 0  PHQ-9 Score 5 2 0  Difficult doing work/chores Not difficult at all Not difficult at all Not difficult at all       11/08/2023    9:43 AM 05/21/2022    1:41 PM 01/25/2021   10:55 AM  GAD 7 : Generalized Anxiety Score  Nervous, Anxious, on Edge 1 0 0  Control/stop worrying 1 1 1   Worry too much - different things 1 1 1   Trouble relaxing 0 0 0  Restless 0 0 0  Easily annoyed or irritable 0 0 0  Afraid - awful might happen 1 0 0  Total GAD 7 Score 4 2 2   Anxiety Difficulty Not difficult at all Not difficult at all Not difficult at all     Past Medical History:  Diagnosis Date   Arthritis    Left ureteral calculus    Psoriasis SEVERE--  RECEIVES LASE TX'S TWICE WEEKLY   Tremfya   Renal calculus, left    Sleep apnea 02/23/2015   No longer on CPAP   Past Surgical History:  Procedure Laterality Date   ABDOMINAL HYSTERECTOMY  1994   W/ UNILATERAL SALPINGOOPHORECTOMY   BREAST BIOPSY Right 07/14/2019   Affirm Bx- Coil clip-  FIBROADENOMATOID CHANGE    BREAST BIOPSY Left 07/14/2019   Affirm Bx- X Clip-   FIBROADENOMATOID CHANGE    COLONOSCOPY WITH PROPOFOL N/A 03/04/2015   Procedure: COLONOSCOPY WITH PROPOFOL;  Surgeon: Midge Minium, MD;  Location: [facility];  Service: Endoscopy;  Laterality: N/A;   CYSTOSCOPY WITH RETROGRADE PYELOGRAM, URETEROSCOPY AND STENT PLACEMENT Left 12/25/2012   Procedure: CYSTOSCOPY WITH RETROGRADE PYELOGRAM, URETEROSCOPY AND STENT PLACEMENT;  Surgeon: Sebastian Ache, MD;  Location: [facility];  Service: Urology;  Laterality: Left;  90 MIN NEEDS DIG URETEROSCOPE    HOLMIUM LASER APPLICATION Left 12/25/2012   Procedure: HOLMIUM LASER APPLICATION;  Surgeon: Sebastian Ache, MD;  Location: [facility];  Service: Urology;  Laterality:  Left;   TONSILLECTOMY  AGE 48   Social History   Socioeconomic History   Marital status: Married    Spouse name: Not on file   Number of children: 2   Years of education: Administrative Degree 6 yr   Highest education level: Master's degree (e.g., MA, MS, MEng, MEd, MSW, MBA)  Occupational History   Occupation: Retired - Programmer, systems  Tobacco Use   Smoking status: Never   Smokeless tobacco: Never  Vaping Use   Vaping status: Never Used  Substance and Sexual Activity   Alcohol use: No   Drug use: No   Sexual activity: Not on file  Other Topics Concern   Not on file  Social History Narrative   Not on file   Social Drivers of Health   Financial Resource Strain: Low Risk  (11/08/2023)   Overall Financial Resource Strain (CARDIA)    Difficulty of Paying Living Expenses: Not hard at all  Food Insecurity: No Food Insecurity (11/08/2023)   Hunger Vital Sign    Worried About Running Out of Food in the Last Year: Never true    Ran Out of Food in the Last Year: Never true  Transportation Needs: No Transportation Needs (11/08/2023)   PRAPARE - Administrator, Civil Service (Medical): No    Lack of Transportation (Non-Medical): No  Physical Activity: Inactive (11/08/2023)   Exercise Vital Sign    Days of Exercise per Week: 0 days    Minutes of Exercise per Session: 0 min  Stress: No Stress Concern Present (11/08/2023)   [employer] of Occupational Health - Occupational Stress Questionnaire    Feeling of Stress : Not at all  Social Connections: Socially Integrated (11/08/2023)   Social Connection and Isolation Panel [NHANES]    Frequency of Communication with Friends and Family: More than three times a week    Frequency of Social Gatherings with Friends and Family: More than three times a week    Attends Religious Services: More than 4 times per year    Active Member of [employer] or Organizations: Yes    Attends Engineer, structural: More than 4 times per year     Marital Status: Married  Catering manager Violence: Not At Risk (11/08/2023)   Humiliation, Afraid, Rape, and Kick questionnaire    Fear of Current or Ex-Partner: No    Emotionally Abused: No    Physically Abused: No    Sexually Abused: No   Family History  Problem Relation Age of Onset   Heart disease Mother    Heart failure Mother    Arthritis Mother    Heart attack Mother 75       Secondary CVA   Cancer Father        lung  cancer   Breast cancer Neg Hx    Current Outpatient Medications on File Prior to Visit  Medication Sig   aspirin 325 MG tablet Take 325 mg by mouth daily.   Cholecalciferol (VITAMIN D3) 50 MCG (2000 UT) TABS Take 2,000 Units by mouth daily.   hydrocortisone 2.5 % cream Apply to affected areas 1-2 times daily as needed for itch.   hydroquinone 4 % cream Apply to dark areas on skin at qd-bid   ketoconazole (NIZORAL) 2 % cream Apply 1-2 times daily to body folds prn flares.   Roflumilast (ZORYVE) 0.3 % CREA Apply 1 Application topically daily. qd to aa psoriasis under breast in body folds as needed for flares   rosuvastatin (CRESTOR) 20 MG tablet Take 1 tablet (20 mg total) by mouth at bedtime.   TREMFYA 100 MG/ML prefilled syringe INJECT 1 SYRINGE UNDER THE SKIN EVERY 8 WEEKS   vitamin B-12 (CYANOCOBALAMIN) 1000 MCG tablet Take 1,000 mcg by mouth daily.   amLODipine (NORVASC) 2.5 MG tablet Take 1 tablet (2.5 mg total) by mouth daily.   Current Facility-Administered Medications on File Prior to Visit  Medication   [START ON 12/23/2023] guselkumab (TREMFYA) prefilled syringe 100 mg    Review of Systems  Constitutional:  Negative for activity change, appetite change, chills, diaphoresis, fatigue and fever.  HENT:  Negative for congestion and hearing loss.   Eyes:  Negative for visual disturbance.  Respiratory:  Negative for cough, chest tightness, shortness of breath and wheezing.   Cardiovascular:  Negative for chest pain, palpitations and leg swelling.   Gastrointestinal:  Negative for abdominal pain, constipation, diarrhea, nausea and vomiting.  Genitourinary:  Negative for dysuria, frequency and hematuria.  Musculoskeletal:  Negative for arthralgias and neck pain.  Skin:  Negative for rash.  Neurological:  Negative for dizziness, weakness, light-headedness, numbness and headaches.  Hematological:  Negative for adenopathy.  Psychiatric/Behavioral:  Negative for behavioral problems, dysphoric mood and sleep disturbance.    Per HPI unless specifically indicated above     Objective:    BP 132/78   Pulse (!) 58   Ht 5\' 6"  (1.676 m)   Wt 237 lb (107.5 kg)   BMI 38.25 kg/m   Wt Readings from Last 3 Encounters:  11/08/23 237 lb (107.5 kg)  11/08/23 237 lb 3.2 oz (107.6 kg)  04/11/23 236 lb 4 oz (107.2 kg)    Physical Exam Vitals and nursing note reviewed.  Constitutional:      General: She is not in acute distress.    Appearance: She is well-developed. She is obese. She is not diaphoretic.     Comments: Well-appearing, comfortable, cooperative  HENT:     Head: Normocephalic and atraumatic.  Eyes:     General:        Right eye: No discharge.        Left eye: No discharge.     Conjunctiva/sclera: Conjunctivae normal.     Pupils: Pupils are equal, round, and reactive to light.  Neck:     Thyroid: No thyromegaly.  Cardiovascular:     Rate and Rhythm: Normal rate and regular rhythm.     Pulses: Normal pulses.     Heart sounds: Normal heart sounds. No murmur heard. Pulmonary:     Effort: Pulmonary effort is normal. No respiratory distress.     Breath sounds: Normal breath sounds. No wheezing or rales.  Abdominal:     General: Bowel sounds are normal. There is no distension.  Palpations: Abdomen is soft. There is no mass.     Tenderness: There is no abdominal tenderness.  Musculoskeletal:        General: No tenderness. Normal range of motion.     Cervical back: Normal range of motion and neck supple.     Comments: Upper  / Lower Extremities: - Normal muscle tone, strength bilateral upper extremities 5/5, lower extremities 5/5  Lymphadenopathy:     Cervical: No cervical adenopathy.  Skin:    General: Skin is warm and dry.     Findings: No erythema or rash.  Neurological:     Mental Status: She is alert and oriented to person, place, and time.     Comments: Distal sensation intact to light touch all extremities  Psychiatric:        Mood and Affect: Mood normal.        Behavior: Behavior normal.        Thought Content: Thought content normal.     Comments: Well groomed, good eye contact, normal speech and thoughts     Results for orders placed or performed in visit on 11/04/23  TSH   Collection Time: 11/04/23  9:06 AM  Result Value Ref Range   TSH 1.31 0.40 - 4.50 mIU/L  Vitamin B12   Collection Time: 11/04/23  9:06 AM  Result Value Ref Range   Vitamin B-12 1,307 (H) 200 - 1,100 pg/mL  VITAMIN D 25 Hydroxy (Vit-D Deficiency, Fractures)   Collection Time: 11/04/23  9:06 AM  Result Value Ref Range   Vit D, 25-Hydroxy 29 (L) 30 - 100 ng/mL  Hemoglobin A1c   Collection Time: 11/04/23  9:06 AM  Result Value Ref Range   Hgb A1c MFr Bld 6.0 (H) <5.7 % of total Hgb   Mean Plasma Glucose 126 mg/dL   eAG (mmol/L) 7.0 mmol/L  Lipid panel   Collection Time: 11/04/23  9:06 AM  Result Value Ref Range   Cholesterol 138 <200 mg/dL   HDL 46 (L) > OR = 50 mg/dL   Triglycerides 84 <161 mg/dL   LDL Cholesterol (Calc) 76 mg/dL (calc)   Total CHOL/HDL Ratio 3.0 <5.0 (calc)   Non-HDL Cholesterol (Calc) 92 <096 mg/dL (calc)  CBC with Differential/Platelet   Collection Time: 11/04/23  9:06 AM  Result Value Ref Range   WBC 4.3 3.8 - 10.8 Thousand/uL   RBC 4.34 3.80 - 5.10 Million/uL   Hemoglobin 13.8 11.7 - 15.5 g/dL   HCT 04.5 40.9 - 81.1 %   MCV 97.5 80.0 - 100.0 fL   MCH 31.8 27.0 - 33.0 pg   MCHC 32.6 32.0 - 36.0 g/dL   RDW 91.4 78.2 - 95.6 %   Platelets 173 140 - 400 Thousand/uL   MPV 11.3 7.5 -  12.5 fL   Neutro Abs 2,602 1,500 - 7,800 cells/uL   Absolute Lymphocytes 860 850 - 3,900 cells/uL   Absolute Monocytes 649 200 - 950 cells/uL   Eosinophils Absolute 151 15 - 500 cells/uL   Basophils Absolute 39 0 - 200 cells/uL   Neutrophils Relative % 60.5 %   Total Lymphocyte 20.0 %   Monocytes Relative 15.1 %   Eosinophils Relative 3.5 %   Basophils Relative 0.9 %  COMPLETE METABOLIC PANEL WITH GFR   Collection Time: 11/04/23  9:06 AM  Result Value Ref Range   Glucose, Bld 86 65 - 99 mg/dL   BUN 10 7 - 25 mg/dL   Creat 2.13 0.86 - 5.78 mg/dL  eGFR 92 > OR = 60 mL/min/1.90m2   BUN/Creatinine Ratio SEE NOTE: 6 - 22 (calc)   Sodium 139 135 - 146 mmol/L   Potassium 4.2 3.5 - 5.3 mmol/L   Chloride 105 98 - 110 mmol/L   CO2 29 20 - 32 mmol/L   Calcium 9.5 8.6 - 10.4 mg/dL   Total Protein 7.5 6.1 - 8.1 g/dL   Albumin 4.1 3.6 - 5.1 g/dL   Globulin 3.4 1.9 - 3.7 g/dL (calc)   AG Ratio 1.2 1.0 - 2.5 (calc)   Total Bilirubin 0.4 0.2 - 1.2 mg/dL   Alkaline phosphatase (APISO) 91 37 - 153 U/L   AST 16 10 - 35 U/L   ALT 7 6 - 29 U/L      Assessment & Plan:   Problem List Items Addressed This Visit     Morbid obesity (HCC)   Relevant Medications   CONTRAVE 8-90 MG TB12   Other Visit Diagnoses       Annual physical exam    -  Primary     Need for Streptococcus pneumoniae vaccination       Relevant Orders   Pneumococcal conjugate vaccine 20-valent (Completed)        Updated Health Maintenance information Reviewed recent lab results with patient Encouraged improvement to lifestyle with diet and exercise Goal of weight loss   Obesity Weight decreased from 247 to 237 pounds. Interested in [place] for appetite control. Discussed side effects and cost. Explained insurance challenges. - Provide 7-day free sample of Contrave. - Discuss insurance coverage for weight loss medications. GLP1 limited - Consider lifestyle modifications including increased physical  activity.  Hyperlipidemia LDL improved from 139 to 76 with rosuvastatin. - Continue rosuvastatin.  Mild Prediabetes A1c 6.0 stable. Blood sugar levels well-managed, maintaining mild prediabetic state.  Pneumococcal Vaccination Due for pneumococcal vaccination. Explained benefits. She agreed to receive it today. - Administer Prevnar 20 vaccine today.  General Health Maintenance Mammogram recent. Colon screening due in one year. Bone density due in one year. Declined shingles vaccine. Discussed Cologuard as alternative to colonoscopy. - Consider Cologuard for colon cancer screening in one year. - Schedule bone density screening in one year.  Follow-up Prefers to wait on weight loss medication results before follow-up. - Schedule follow-up appointment in three months if she starts weight loss medication.         Orders Placed This Encounter  Procedures   Pneumococcal conjugate vaccine 20-valent    Meds ordered this encounter  Medications   CONTRAVE 8-90 MG TB12    Sig: Week 1: Take 1 tab daily with breakfast. Week 2: Take 1 tab twice daily with meal. Week 3: Take 2 tab with AM meal and 1 tab with PM meal. Week 4+: Take 2 tabs twice daily with meals - continue for weight loss     Follow up plan: Return in about 3 months (around 02/08/2024) for 3 month Weight Check / Med.  Saralyn Pilar, DO [facility] 11/08/2023, 10:12 AM

## 2023-11-08 NOTE — Progress Notes (Signed)
 Subjective:   Morgan Mcclain is a 73 y.o. who presents for a Medicare Wellness preventive visit.  Visit Complete: In person   Persons Participating in Visit: Patient.  AWV Questionnaire: No: Patient Medicare AWV questionnaire was not completed prior to this visit.  Cardiac Risk Factors include: advanced age (>7men, >22 women);dyslipidemia;sedentary lifestyle;obesity (BMI >30kg/m2)     Objective:    Today's Vitals   11/08/23 0857  BP: (!) 142/78  Weight: 237 lb 3.2 oz (107.6 kg)  Height: 5\' 6"  (1.676 m)   Body mass index is 38.29 kg/m.     11/08/2023    9:13 AM 01/07/2023   12:26 PM 11/02/2022    9:29 AM 09/29/2021    9:39 AM 09/06/2020   11:02 AM 09/01/2019    3:23 PM 08/05/2018    9:27 AM  Advanced Directives  Does Patient Have a Medical Advance Directive? No No No No Yes Yes Yes  Type of Agricultural consultant;Living will Living will;Healthcare Power of [employer] Power of [place];Living will  Copy of Healthcare Power of Attorney in Chart?     No - copy requested No - copy requested No - copy requested  Would patient like information on creating a medical advance directive? No - Patient declined  No - Patient declined No - Patient declined       Current Medications (verified) Outpatient Encounter Medications as of 11/08/2023  Medication Sig   aspirin 325 MG tablet Take 325 mg by mouth daily.   Cholecalciferol (VITAMIN D3) 50 MCG (2000 UT) TABS Take 2,000 Units by mouth daily.   hydrocortisone 2.5 % cream Apply to affected areas 1-2 times daily as needed for itch.   hydroquinone 4 % cream Apply to dark areas on skin at qd-bid   ketoconazole (NIZORAL) 2 % cream Apply 1-2 times daily to body folds prn flares.   Roflumilast (ZORYVE) 0.3 % CREA Apply 1 Application topically daily. qd to aa psoriasis under breast in body folds as needed for flares   rosuvastatin (CRESTOR) 20 MG tablet Take 1 tablet (20 mg total) by mouth at bedtime.    TREMFYA 100 MG/ML prefilled syringe INJECT 1 SYRINGE UNDER THE SKIN EVERY 8 WEEKS   vitamin B-12 (CYANOCOBALAMIN) 1000 MCG tablet Take 1,000 mcg by mouth daily.   amLODipine (NORVASC) 2.5 MG tablet Take 1 tablet (2.5 mg total) by mouth daily.   Facility-Administered Encounter Medications as of 11/08/2023  Medication   aspirin EC tablet 325 mg   [START ON 12/23/2023] guselkumab (TREMFYA) prefilled syringe 100 mg    Allergies (verified) Lamisil [terbinafine] and Shellfish allergy   History: Past Medical History:  Diagnosis Date   Arthritis    Left ureteral calculus    Psoriasis SEVERE--  RECEIVES LASE TX'S TWICE WEEKLY   Tremfya   Renal calculus, left    Sleep apnea 02/23/2015   No longer on CPAP   Past Surgical History:  Procedure Laterality Date   ABDOMINAL HYSTERECTOMY  1994   W/ UNILATERAL SALPINGOOPHORECTOMY   BREAST BIOPSY Right 07/14/2019   Affirm Bx- Coil clip-    FIBROADENOMATOID CHANGE    BREAST BIOPSY Left 07/14/2019   Affirm Bx- X Clip-   FIBROADENOMATOID CHANGE    COLONOSCOPY WITH PROPOFOL N/A 03/04/2015   Procedure: COLONOSCOPY WITH PROPOFOL;  Surgeon: Midge Minium, MD;  Location: [facility];  Service: Endoscopy;  Laterality: N/A;   CYSTOSCOPY WITH RETROGRADE PYELOGRAM, URETEROSCOPY AND STENT PLACEMENT Left 12/25/2012  Procedure: CYSTOSCOPY WITH RETROGRADE PYELOGRAM, URETEROSCOPY AND STENT PLACEMENT;  Surgeon: Sebastian Ache, MD;  Location: [facility];  Service: Urology;  Laterality: Left;  90 MIN NEEDS DIG URETEROSCOPE    HOLMIUM LASER APPLICATION Left 12/25/2012   Procedure: HOLMIUM LASER APPLICATION;  Surgeon: Sebastian Ache, MD;  Location: [facility];  Service: Urology;  Laterality: Left;   TONSILLECTOMY  AGE 67   Family History  Problem Relation Age of Onset   Heart disease Mother    Heart failure Mother    Arthritis Mother    Heart attack Mother 53       Secondary CVA   Cancer Father        lung cancer   Breast cancer  Neg Hx    Social History   Socioeconomic History   Marital status: Married    Spouse name: Not on file   Number of children: 2   Years of education: Administrative Degree 6 yr   Highest education level: Master's degree (e.g., MA, MS, MEng, MEd, MSW, MBA)  Occupational History   Occupation: Retired - Programmer, systems  Tobacco Use   Smoking status: Never   Smokeless tobacco: Never  Vaping Use   Vaping status: Never Used  Substance and Sexual Activity   Alcohol use: No   Drug use: No   Sexual activity: Not on file  Other Topics Concern   Not on file  Social History Narrative   Not on file   Social Drivers of Health   Financial Resource Strain: Low Risk  (11/08/2023)   Overall Financial Resource Strain (CARDIA)    Difficulty of Paying Living Expenses: Not hard at all  Food Insecurity: No Food Insecurity (11/08/2023)   Hunger Vital Sign    Worried About Running Out of Food in the Last Year: Never true    Ran Out of Food in the Last Year: Never true  Transportation Needs: No Transportation Needs (11/08/2023)   PRAPARE - Administrator, Civil Service (Medical): No    Lack of Transportation (Non-Medical): No  Physical Activity: Inactive (11/08/2023)   Exercise Vital Sign    Days of Exercise per Week: 0 days    Minutes of Exercise per Session: 0 min  Stress: No Stress Concern Present (11/08/2023)   [employer] of Occupational Health - Occupational Stress Questionnaire    Feeling of Stress : Not at all  Social Connections: Socially Integrated (11/08/2023)   Social Connection and Isolation Panel [NHANES]    Frequency of Communication with Friends and Family: More than three times a week    Frequency of Social Gatherings with Friends and Family: More than three times a week    Attends Religious Services: More than 4 times per year    Active Member of [employer] or Organizations: Yes    Attends Engineer, structural: More than 4 times per year    Marital Status: Married     Tobacco Counseling Counseling given: Not Answered    Clinical Intake:  Pre-visit preparation completed: Yes  Pain : No/denies pain     BMI - recorded: 38.29 Nutritional Status: BMI > 30  Obese Nutritional Risks: None Diabetes: No  How often do you need to have someone help you when you read instructions, pamphlets, or other written materials from your doctor or pharmacy?: 1 - Never  Interpreter Needed?: No  Information entered by :: Kennedy Bucker, LPN   Activities of Daily Living     11/08/2023  9:17 AM  In your present state of health, do you have any difficulty performing the following activities:  Hearing? 0  Vision? 0  Difficulty concentrating or making decisions? 0  Walking or climbing stairs? 1  Dressing or bathing? 0  Doing errands, shopping? 0  Preparing Food and eating ? N  Using the Toilet? N  In the past six months, have you accidently leaked urine? N  Do you have problems with loss of bowel control? N  Managing your Medications? N  Managing your Finances? N  Housekeeping or managing your Housekeeping? N    Patient Care Team: Smitty Cords, DO as PCP - General (Family Medicine) Iran Ouch, MD as PCP - Cardiology (Cardiology) Willeen Niece, MD (Dermatology) [name], MD as Referring Physician (Internal Medicine)  Indicate any recent Medical Services you may have received from other than [facility] providers in the past year (date may be approximate).     Assessment:   This is a routine wellness examination for Yeimi.  Hearing/Vision screen Hearing Screening - Comments:: NO AIDS Vision Screening - Comments:: READERS, DRIVING- MY EYE DOCTOR   Goals Addressed             This Visit's Progress    Cut out extra servings         Depression Screen     11/08/2023    9:07 AM 11/02/2022    9:26 AM 05/21/2022    1:41 PM 09/29/2021    9:36 AM 01/25/2021   10:55 AM 09/06/2020   11:03 AM 08/09/2020    9:33 AM   PHQ 2/9 Scores  PHQ - 2 Score 0 0 0 0 0 0 0  PHQ- 9 Score 2 0 2  2      Fall Risk     11/08/2023    9:16 AM 11/02/2022    9:30 AM 05/21/2022    1:41 PM 09/29/2021    9:41 AM 09/06/2020   11:02 AM  Fall Risk   Falls in the past year? 1 0 0 1 1  Comment     missed a step  Number falls in past yr: 1 0 0 0 0  Injury with Fall? 0 0 0 0 0  Risk for fall due to :  No Fall Risks No Fall Risks History of fall(s) Medication side effect  Follow up Falls prevention discussed;Falls evaluation completed Falls prevention discussed;Falls evaluation completed Falls evaluation completed Falls prevention discussed Falls evaluation completed;Education provided;Falls prevention discussed    MEDICARE RISK AT HOME:  Medicare Risk at Home Any stairs in or around the home?: Yes If so, are there any without handrails?: No Home free of loose throw rugs in walkways, pet beds, electrical cords, etc?: Yes Adequate lighting in your home to reduce risk of falls?: Yes Life alert?: No Use of a cane, walker or w/c?: Yes (HAS USED CANE WITH INJURY OR LEGS HURTING) Grab bars in the bathroom?: No Shower chair or bench in shower?: No Elevated toilet seat or a handicapped toilet?: Yes  TIMED UP AND GO:  Was the test performed?  Yes  Length of time to ambulate 10 feet: 4 sec Gait steady and fast without use of assistive device  Cognitive Function: 6CIT completed        11/08/2023    9:19 AM 11/02/2022    9:37 AM 09/06/2020   11:05 AM 08/05/2018    9:39 AM 07/30/2017    9:43 AM  6CIT Screen  What Year? 0 points  0 points 0 points 0 points 0 points  What month? 0 points 0 points 0 points 0 points 0 points  What time? 0 points 0 points 0 points 0 points 0 points  Count back from 20 0 points 0 points 0 points 0 points 0 points  Months in reverse 0 points 0 points 0 points 0 points 0 points  Repeat phrase 0 points 0 points 2 points 0 points 2 points  Total Score 0 points 0 points 2 points 0 points 2 points     Immunizations Immunization History  Administered Date(s) Administered   Fluad Quad(high Dose 65+) 06/12/2019   Influenza, High Dose Seasonal PF 07/27/2017, 07/29/2018, 07/07/2021, 05/16/2022   Influenza-Unspecified 04/27/2016, 07/27/2017, 07/16/2020, 06/06/2023   Moderna Covid-19 Fall Seasonal Vaccine 75yrs & older 07/29/2023   PFIZER(Purple Top)SARS-COV-2 Vaccination 09/18/2019, 10/10/2019, 07/04/2020   Pfizer Covid-19 Vaccine Bivalent Booster 84yrs & up 07/31/2021   Pneumococcal Conjugate-13 07/31/2017   Tdap 08/27/2009   Unspecified SARS-COV-2 Vaccination 07/17/2022   Zoster, Live 08/27/2013    Screening Tests Health Maintenance  Topic Date Due   Zoster Vaccines- Shingrix (1 of 2) 04/14/1970   Pneumonia Vaccine 70+ Years old (2 of 2 - PPSV23 or PCV20) 09/25/2017   DTaP/Tdap/Td (2 - Td or Tdap) 08/28/2019   COVID-19 Vaccine (7 - Pfizer risk 2024-25 season) 01/27/2024   MAMMOGRAM  10/15/2024   Medicare Annual Wellness (AWV)  11/07/2024   Colonoscopy  03/03/2025   DEXA SCAN  01/09/2028   INFLUENZA VACCINE  Completed   Hepatitis C Screening  Completed   HPV VACCINES  Aged Out    Health Maintenance  Health Maintenance Due  Topic Date Due   Zoster Vaccines- Shingrix (1 of 2) 04/14/1970   Pneumonia Vaccine 20+ Years old (2 of 2 - PPSV23 or PCV20) 09/25/2017   DTaP/Tdap/Td (2 - Td or Tdap) 08/28/2019   Health Maintenance Items Addressed: UP TO DATE  Additional Screening:  Vision Screening: Recommended annual ophthalmology exams for early detection of glaucoma and other disorders of the eye.  Dental Screening: Recommended annual dental exams for proper oral hygiene  Community Resource Referral / Chronic Care Management: CRR required this visit?  No   CCM required this visit?  No     Plan:     I have personally reviewed and noted the following in the patient's chart:   Medical and social history Use of alcohol, tobacco or illicit drugs  Current medications  and supplements including opioid prescriptions. Patient is not currently taking opioid prescriptions. Functional ability and status Nutritional status Physical activity Advanced directives List of other physicians Hospitalizations, surgeries, and ER visits in previous 12 months Vitals Screenings to include cognitive, depression, and falls Referrals and appointments  In addition, I have reviewed and discussed with patient certain preventive protocols, quality metrics, and best practice recommendations. A written personalized care plan for preventive services as well as general preventive health recommendations were provided to patient.     Hal Hope, LPN   11/01/6576   After Visit Summary: (In Person-Declined) Patient declined AVS at this time.  Notes: Nothing significant to report at this time.

## 2023-11-08 NOTE — Patient Instructions (Addendum)
 Thank you for coming to the office today.  Labs look good  Prevnar 20 vaccine today  Try 1 week sample Contrave - Week 1: 1 pill daily with meal - Week 2: 1 pill twice a day with meal - Week 3: 2 pill with meal in AM and 1 pill with PM meal - Week 4: 2 pill twice a day with meal   Please notify me after 1 week, and if doing well on Contrave, I can order to [facility] pharmacy $99 per month plan with this med.   Please schedule a Follow-up Appointment to: Return in about 3 months (around 02/08/2024) for 3 month Weight Check / Med.  If you have any other questions or concerns, please feel free to call the office or send a message through MyChart. You may also schedule an earlier appointment if necessary.  Additionally, you may be receiving a survey about your experience at our office within a few days to 1 week by e-mail or mail. We value your feedback.  Saralyn Pilar, DO [facility], [place]

## 2023-11-08 NOTE — Patient Instructions (Addendum)
 Morgan Mcclain , Thank you for taking time to come for your Medicare Wellness Visit. I appreciate your ongoing commitment to your health goals. Please review the following plan we discussed and let me know if I can assist you in the future.   Referrals/Orders/Follow-Ups/Clinician Recommendations: NONE  This is a list of the screening recommended for you and due dates:  Health Maintenance  Topic Date Due   Zoster (Shingles) Vaccine (1 of 2) 04/14/1970   Pneumonia Vaccine (2 of 2 - PPSV23 or PCV20) 09/25/2017   DTaP/Tdap/Td vaccine (2 - Td or Tdap) 08/28/2019   COVID-19 Vaccine (7 - Pfizer risk 2024-25 season) 01/27/2024   Mammogram  10/15/2024   Medicare Annual Wellness Visit  11/07/2024   Colon Cancer Screening  03/03/2025   DEXA scan (bone density measurement)  01/09/2028   Flu Shot  Completed   Hepatitis C Screening  Completed   HPV Vaccine  Aged Out    Advanced directives: (ACP Link)Information on Advanced Care Planning can be found at [facility] of [employer] Advance Health Care Directives Advance Health Care Directives. [email or web address]   Next Medicare Annual Wellness Visit scheduled for next year: Yes   11/13/24 @ 8:50 AM IN PERSON

## 2023-12-06 ENCOUNTER — Other Ambulatory Visit: Payer: Self-pay | Admitting: Dermatology

## 2023-12-06 DIAGNOSIS — L409 Psoriasis, unspecified: Secondary | ICD-10-CM

## 2023-12-26 LAB — QUANTIFERON-TB GOLD PLUS
QuantiFERON Mitogen Value: 0.88 [IU]/mL
QuantiFERON Nil Value: 0.03 [IU]/mL
QuantiFERON TB1 Ag Value: 0.03 [IU]/mL
QuantiFERON TB2 Ag Value: 0.05 [IU]/mL
QuantiFERON-TB Gold Plus: NEGATIVE

## 2023-12-27 ENCOUNTER — Encounter: Payer: Self-pay | Admitting: Cardiovascular Disease

## 2023-12-27 ENCOUNTER — Ambulatory Visit: Attending: Cardiovascular Disease | Admitting: Cardiovascular Disease

## 2023-12-27 VITALS — BP 115/62 | HR 58 | Ht 67.0 in | Wt 239.0 lb

## 2023-12-27 DIAGNOSIS — E785 Hyperlipidemia, unspecified: Secondary | ICD-10-CM

## 2023-12-27 DIAGNOSIS — I25118 Atherosclerotic heart disease of native coronary artery with other forms of angina pectoris: Secondary | ICD-10-CM

## 2023-12-27 DIAGNOSIS — I10 Essential (primary) hypertension: Secondary | ICD-10-CM | POA: Diagnosis not present

## 2023-12-27 NOTE — Progress Notes (Signed)
 Cardiology Office Note   Date:  12/27/2023   ID:  Morgan Mcclain, DOB [birth year], MRN [id number]  PCP:  Raina Bunting, DO  Cardiologist:   Antionette Kirks, MD   Chief Complaint  Patient presents with   Follow-up    Had a little flutter on exertion       History of Present Illness: Morgan Mcclain is a 72 y.o. female who is here today for a follow-up visit regarding coronary artery disease.   She has known history of prediabetes, elevated blood pressure, obesity and hyperlipidemia.  She is not a smoker.  Her mother had CABG and ultimately died of congestive heart failure. She was seen last year for intermittent episodes of substernal chest pain and tightness. She had about 5 episodes described as heavy feeling when she was trying to hold things.    Her EKG showed anterolateral T wave changes suggestive of ischemia.   Cardiac CTA in 01/2023 showed a calcium  score of 2499 with mild nonobstructive three-vessel coronary artery disease.  There was a small hiatal hernia.  Echocardiogram showed normal LV systolic function with mild mitral regurgitation.  She has been doing reasonably well with no chest pain or worsening dyspnea.  She is not very active.  She takes her medications regularly.  She has occasional palpitations.  Past Medical History:  Diagnosis Date   Arthritis    Left ureteral calculus    Psoriasis SEVERE--  RECEIVES LASE TX'S TWICE WEEKLY   Tremfya    Renal calculus, left    Sleep apnea 02/23/2015   No longer on CPAP    Past Surgical History:  Procedure Laterality Date   ABDOMINAL HYSTERECTOMY  1994   W/ UNILATERAL SALPINGOOPHORECTOMY   BREAST BIOPSY Right 07/14/2019   Affirm Bx- Coil clip-    FIBROADENOMATOID CHANGE    BREAST BIOPSY Left 07/14/2019   Affirm Bx- X Clip-   FIBROADENOMATOID CHANGE    COLONOSCOPY WITH PROPOFOL  N/A 03/04/2015   Procedure: COLONOSCOPY WITH PROPOFOL ;  Surgeon: Marnee Sink, MD;  Location: [facility];  Service:  Endoscopy;  Laterality: N/A;   CYSTOSCOPY WITH RETROGRADE PYELOGRAM, URETEROSCOPY AND STENT PLACEMENT Left 12/25/2012   Procedure: CYSTOSCOPY WITH RETROGRADE PYELOGRAM, URETEROSCOPY AND STENT PLACEMENT;  Surgeon: Osborn Blaze, MD;  Location: [facility];  Service: Urology;  Laterality: Left;  90 MIN NEEDS DIG URETEROSCOPE    HOLMIUM LASER APPLICATION Left 12/25/2012   Procedure: HOLMIUM LASER APPLICATION;  Surgeon: Osborn Blaze, MD;  Location: [facility];  Service: Urology;  Laterality: Left;   TONSILLECTOMY  AGE 61     Current Outpatient Medications  Medication Sig Dispense Refill   aspirin  81 MG chewable tablet Chew 81 mg by mouth daily.     Cholecalciferol (VITAMIN D3) 50 MCG (2000 UT) TABS Take 2,000 Units by mouth daily.     CONTRAVE  8-90 MG TB12 Week 1: Take 1 tab daily with breakfast. Week 2: Take 1 tab twice daily with meal. Week 3: Take 2 tab with AM meal and 1 tab with PM meal. Week 4+: Take 2 tabs twice daily with meals - continue for weight loss     hydrocortisone  2.5 % cream Apply to affected areas 1-2 times daily as needed for itch. 60 g 2   hydroquinone  4 % cream Apply to dark areas on skin at qd-bid 28.35 g 2   ketoconazole  (NIZORAL ) 2 % cream Apply 1-2 times daily to body folds prn flares. 60 g 3  Roflumilast  (ZORYVE ) 0.3 % CREA Apply 1 Application topically daily. qd to aa psoriasis under breast in body folds as needed for flares 60 g 6   rosuvastatin  (CRESTOR ) 20 MG tablet Take 1 tablet (20 mg total) by mouth at bedtime. 90 tablet 3   TREMFYA  100 MG/ML prefilled syringe INJECT 1 SYRINGE UNDER THE SKIN EVERY 8 WEEKS 2 mL 1   vitamin B-12 (CYANOCOBALAMIN ) 1000 MCG tablet Take 1,000 mcg by mouth daily.     amLODipine  (NORVASC ) 2.5 MG tablet Take 1 tablet (2.5 mg total) by mouth daily. 90 tablet 3   Current Facility-Administered Medications  Medication Dose Route Frequency Provider Last Rate Last Admin   guselkumab  (TREMFYA ) prefilled syringe  100 mg  100 mg Subcutaneous Q8 weeks Stewart, Tara, MD        Allergies:   Lamisil [terbinafine] and Shellfish allergy    Social History:  The patient  reports that she has never smoked. She has never used smokeless tobacco. She reports that she does not drink alcohol and does not use drugs.   Family History:  The patient's family history includes Arthritis in her mother; Cancer in her father; Heart attack (age of onset: 36) in her mother; Heart disease in her mother; Heart failure in her mother.    ROS:  Please see the history of present illness.   Otherwise, review of systems are positive for none.   All other systems are reviewed and negative.    PHYSICAL EXAM: VS:  BP 115/62 (BP Location: Left Arm, Patient Position: Sitting, Cuff Size: Normal)   Pulse (!) 58   Ht 5\' 7"  (1.702 m)   Wt 239 lb (108.4 kg)   SpO2 98%   BMI 37.43 kg/m  , BMI Body mass index is 37.43 kg/m. GEN: Well nourished, well developed, in no acute distress  HEENT: normal  Neck: no JVD, carotid bruits, or masses Cardiac: RRR; no murmurs, rubs, or gallops, mild bilateral leg edema Respiratory:  clear to auscultation bilaterally, normal work of breathing GI: soft, nontender, nondistended, + BS MS: no deformity or atrophy  Skin: warm and dry, no rash Neuro:  Strength and sensation are intact Psych: euthymic mood, full affect Dorsalis pedis and posterior tibial are both palpable bilaterally.   EKG:  EKG is ordered today. The ekg ordered today demonstrates : Sinus bradycardia with occasional Premature ventricular complexes Possible Left atrial enlargement Nonspecific T wave abnormality When compared with ECG of 11-Apr-2023 09:31, Premature ventricular complexes are now Present   Recent Labs: 11/04/2023: ALT 7; BUN 10; Creat 0.70; Hemoglobin 13.8; Platelets 173; Potassium 4.2; Sodium 139; TSH 1.31    Lipid Panel    Component Value Date/Time   CHOL 138 11/04/2023 0906   CHOL 136 04/11/2023 0954   TRIG  84 11/04/2023 0906   HDL 46 (L) 11/04/2023 0906   HDL 49 04/11/2023 0954   CHOLHDL 3.0 11/04/2023 0906   VLDL 21 06/04/2016 1034   LDLCALC 76 11/04/2023 0906      Wt Readings from Last 3 Encounters:  12/27/23 239 lb (108.4 kg)  11/08/23 237 lb (107.5 kg)  11/08/23 237 lb 3.2 oz (107.6 kg)          01/10/2023   11:51 AM  PAD Screen  Previous PAD dx? Yes  Previous surgical procedure? No  Pain with walking? Yes  Subsides with rest? No  Feet/toe relief with dangling? No  Painful, non-healing ulcers? No  Extremities discolored? No      ASSESSMENT AND  PLAN:  1.  Coronary artery disease involving native coronary arteries without angina: Cardiac CTA showed severely elevated calcium  score above 2000 but no evidence of obstructive disease.  No anginal symptoms at the present time.  Continue low-dose aspirin  and treatment of risk factors. I had a prolonged discussion with her about healthy lifestyle changes.  She she is overall resistant to change but understands the need to do that.  2.  Essential hypertension: Blood pressure is controlled on amlodipine .  4.  Hyperlipidemia: Rosuvastatin  was increased last year to 20 mg daily.  Recent lipid profile showed an LDL of 76.  I discussed the need to get her LDL below 55 given degree of coronary artery calcifications.  I suggested adding ezetimibe but she prefers to hold off and try healthy diet and more exercise to start with.    Disposition:   FU in 6 months.  Signed,  Antionette Kirks, MD  12/27/2023 12:15 PM    [facility]

## 2023-12-27 NOTE — Patient Instructions (Signed)
 Medication Instructions:  No changes *If you need a refill on your cardiac medications before your next appointment, please call your pharmacy*  Lab Work: None ordered If you have labs (blood work) drawn today and your tests are completely normal, you will receive your results only by: MyChart Message (if you have MyChart) OR A paper copy in the mail If you have any lab test that is abnormal or we need to change your treatment, we will call you to review the results.  Testing/Procedures: None ordered  Follow-Up: At [facility], you and your health needs are our priority.  As part of our continuing mission to provide you with exceptional heart care, our providers are all part of one team.  This team includes your primary Cardiologist (physician) and Advanced Practice Providers or APPs (Physician Assistants and Nurse Practitioners) who all work together to provide you with the care you need, when you need it.  Your next appointment:   6 month(s)  Provider:   You may see Antionette Kirks, MD or one of the following Advanced Practice Providers on your designated Care Team:   Laneta Pintos, NP Gildardo Labrador, PA-C Varney Gentleman, PA-C Cadence Woodridge, PA-C Ronald Cockayne, NP Morey Ar, NP    We recommend signing up for the patient portal called "MyChart".  Sign up information is provided on this After Visit Summary.  MyChart is used to connect with patients for Virtual Visits (Telemedicine).  Patients are able to view lab/test results, encounter notes, upcoming appointments, etc.  Non-urgent messages can be sent to your provider as well.   To learn more about what you can do with MyChart, go to [email or web address].   Other Instructions Heart-Healthy Eating Plan Many factors influence your heart health, including eating and exercise habits. Heart health is also called coronary health. Coronary risk increases with abnormal blood fat (lipid) levels. A heart-healthy eating  plan includes limiting unhealthy fats, increasing healthy fats, limiting salt (sodium) intake, and making other diet and lifestyle changes. What is my plan? Your health care provider may recommend that: You limit your fat intake to _________% or less of your total calories each day. You limit your saturated fat intake to _________% or less of your total calories each day. You limit the amount of cholesterol in your diet to less than _________ mg per day. You limit the amount of sodium in your diet to less than _________ mg per day. What are tips for following this plan? Cooking Cook foods using methods other than frying. Baking, boiling, grilling, and broiling are all good options. Other ways to reduce fat include: Removing the skin from poultry. Removing all visible fats from meats. Steaming vegetables in water  or broth. Meal planning  At meals, imagine dividing your plate into fourths: Fill one-half of your plate with vegetables and green salads. Fill one-fourth of your plate with whole grains. Fill one-fourth of your plate with lean protein foods. Eat 2-4 cups of vegetables per day. One cup of vegetables equals 1 cup (91 g) broccoli or cauliflower florets, 2 medium carrots, 1 large bell pepper, 1 large sweet potato, 1 large tomato, 1 medium white potato, 2 cups (150 g) raw leafy greens. Eat 1-2 cups of fruit per day. One cup of fruit equals 1 small apple, 1 large banana, 1 cup (237 g) mixed fruit, 1 large orange,  cup (82 g) dried fruit, 1 cup (240 mL) 100% fruit juice. Eat more foods that contain soluble fiber. Examples include apples, broccoli, carrots, beans,  peas, and barley. Aim to get 25-30 g of fiber per day. Increase your consumption of legumes, nuts, and seeds to 4-5 servings per week. One serving of dried beans or legumes equals  cup (90 g) cooked, 1 serving of nuts is  oz (12 almonds, 24 pistachios, or 7 walnut halves), and 1 serving of seeds equals  oz (8 g). Fats Choose  healthy fats more often. Choose monounsaturated and polyunsaturated fats, such as olive and canola oils, avocado oil, flaxseeds, walnuts, almonds, and seeds. Eat more omega-3 fats. Choose salmon, mackerel, sardines, tuna, flaxseed oil, and ground flaxseeds. Aim to eat fish at least 2 times each week. Check food labels carefully to identify foods with trans fats or high amounts of saturated fat. Limit saturated fats. These are found in animal products, such as meats, butter, and cream. Plant sources of saturated fats include palm oil, palm kernel oil, and coconut oil. Avoid foods with partially hydrogenated oils in them. These contain trans fats. Examples are stick margarine, some tub margarines, cookies, crackers, and other baked goods. Avoid fried foods. General information Eat more home-cooked food and less restaurant, buffet, and fast food. Limit or avoid alcohol. Limit foods that are high in added sugar and simple starches such as foods made using white refined flour (white breads, pastries, sweets). Lose weight if you are overweight. Losing just 5-10% of your body weight can help your overall health and prevent diseases such as diabetes and heart disease. Monitor your sodium intake, especially if you have high blood pressure. Talk with your health care provider about your sodium intake. Try to incorporate more vegetarian meals weekly. What foods should I eat? Fruits All fresh, canned (in natural juice), or frozen fruits. Vegetables Fresh or frozen vegetables (raw, steamed, roasted, or grilled). Green salads. Grains Most grains. Choose whole wheat and whole grains most of the time. Rice and pasta, including brown rice and pastas made with whole wheat. Meats and other proteins Lean, well-trimmed beef, veal, pork, and lamb. Chicken and [place] without skin. All fish and shellfish. Wild duck, rabbit, pheasant, and venison. Egg whites or low-cholesterol egg substitutes. Dried beans, peas,  lentils, and tofu. Seeds and most nuts. Dairy Low-fat or nonfat cheeses, including ricotta and mozzarella. Skim or 1% milk (liquid, powdered, or evaporated). Buttermilk made with low-fat milk. Nonfat or low-fat yogurt. Fats and oils Non-hydrogenated (trans-free) margarines. Vegetable oils, including soybean, sesame, sunflower, olive, avocado, peanut, safflower, corn, canola, and cottonseed. Salad dressings or mayonnaise made with a vegetable oil. Beverages Water  (mineral or sparkling). Coffee and tea. Unsweetened ice tea. Diet beverages. Sweets and desserts Sherbet, gelatin, and fruit ice. Small amounts of dark chocolate. Limit all sweets and desserts. Seasonings and condiments All seasonings and condiments. The items listed above may not be a complete list of foods and beverages you can eat. Contact a dietitian for more options. What foods should I avoid? Fruits Canned fruit in heavy syrup. Fruit in cream or butter sauce. Fried fruit. Limit coconut. Vegetables Vegetables cooked in cheese, cream, or butter sauce. Fried vegetables. Grains Breads made with saturated or trans fats, oils, or whole milk. Croissants. Sweet rolls. Donuts. High-fat crackers, such as cheese crackers and chips. Meats and other proteins Fatty meats, such as hot dogs, ribs, sausage, bacon, rib-eye roast or steak. High-fat deli meats, such as salami and bologna. Caviar. Domestic duck and goose. Organ meats, such as liver. Dairy Cream, sour cream, cream cheese, and creamed cottage cheese. Whole-milk cheeses. Whole or 2% milk (liquid, evaporated,  or condensed). Whole buttermilk. Cream sauce or high-fat cheese sauce. Whole-milk yogurt. Fats and oils Meat fat, or shortening. Cocoa butter, hydrogenated oils, palm oil, coconut oil, palm kernel oil. Solid fats and shortenings, including bacon fat, salt pork, lard, and butter. Nondairy cream substitutes. Salad dressings with cheese or sour cream. Beverages Regular sodas and  any drinks with added sugar. Sweets and desserts Frosting. Pudding. Cookies. Cakes. Pies. Milk chocolate or white chocolate. Buttered syrups. Full-fat ice cream or ice cream drinks. The items listed above may not be a complete list of foods and beverages to avoid. Contact a dietitian for more information. Summary Heart-healthy meal planning includes limiting unhealthy fats, increasing healthy fats, limiting salt (sodium) intake and making other diet and lifestyle changes. Lose weight if you are overweight. Losing just 5-10% of your body weight can help your overall health and prevent diseases such as diabetes and heart disease. Focus on eating a balance of foods, including fruits and vegetables, low-fat or nonfat dairy, lean protein, nuts and legumes, whole grains, and heart-healthy oils and fats. This information is not intended to replace advice given to you by your health care provider. Make sure you discuss any questions you have with your health care provider. Document Revised: 09/18/2021 Document Reviewed: 09/18/2021 Elsevier Patient Education  2024 [employer].

## 2023-12-30 ENCOUNTER — Telehealth: Payer: Self-pay

## 2023-12-30 DIAGNOSIS — L409 Psoriasis, unspecified: Secondary | ICD-10-CM

## 2023-12-30 MED ORDER — TREMFYA 100 MG/ML ~~LOC~~ SOSY: PREFILLED_SYRINGE | SUBCUTANEOUS | 1 refills | Status: DC

## 2023-12-30 NOTE — Telephone Encounter (Signed)
-----   Message from Artemio Larry sent at 12/30/2023 10:13 AM EDT ----- TB negative, can send in 6 mos rfs Tremfya  - please call patient

## 2023-12-30 NOTE — Telephone Encounter (Signed)
 Advised pt of lab results and Tremfya  refills sent in./sh

## 2024-01-06 ENCOUNTER — Ambulatory Visit

## 2024-01-06 DIAGNOSIS — L409 Psoriasis, unspecified: Secondary | ICD-10-CM

## 2024-01-06 NOTE — Progress Notes (Signed)
 Patient here today for 8 week Tremfya  injection for Psoriasis Vulgaris.    Tremfya  100mg  injected into right lower abdomen. Patient tolerated well.   LOT: [id number] EXP: 01/2025 NDC: 65784-696-29   Lisbeth Rides, RMA

## 2024-03-02 ENCOUNTER — Ambulatory Visit

## 2024-03-04 ENCOUNTER — Ambulatory Visit

## 2024-03-05 ENCOUNTER — Ambulatory Visit (INDEPENDENT_AMBULATORY_CARE_PROVIDER_SITE_OTHER)

## 2024-03-05 ENCOUNTER — Other Ambulatory Visit: Payer: Self-pay

## 2024-03-05 DIAGNOSIS — L409 Psoriasis, unspecified: Secondary | ICD-10-CM

## 2024-03-05 DIAGNOSIS — L400 Psoriasis vulgaris: Secondary | ICD-10-CM | POA: Diagnosis not present

## 2024-03-05 MED ORDER — KETOCONAZOLE 2 % EX CREA: TOPICAL_CREAM | CUTANEOUS | 1 refills | Status: AC

## 2024-03-05 NOTE — Progress Notes (Signed)
 Patient here today for 8 week Tremfya  injection for Psoriasis Vulgaris.    Tremfya  100mg  injected into left lower abdomen. Patient tolerated well.   LOT: [id number] EXP: 02/2025 NDC: 42105-359-98   Alan Pizza, RMA

## 2024-03-05 NOTE — Progress Notes (Signed)
 Patient here today for Tremfya  injection. She is complaining of heat rash inframammary area. Dr. Jackquline Junk refill of Ketoconazole . Aw

## 2024-04-14 DIAGNOSIS — R059 Cough, unspecified: Secondary | ICD-10-CM | POA: Diagnosis not present

## 2024-04-14 DIAGNOSIS — R350 Frequency of micturition: Secondary | ICD-10-CM | POA: Diagnosis not present

## 2024-04-14 DIAGNOSIS — Z03818 Encounter for observation for suspected exposure to other biological agents ruled out: Secondary | ICD-10-CM | POA: Diagnosis not present

## 2024-04-14 DIAGNOSIS — U071 COVID-19: Secondary | ICD-10-CM | POA: Diagnosis not present

## 2024-04-22 ENCOUNTER — Other Ambulatory Visit: Payer: Self-pay | Admitting: Cardiovascular Disease

## 2024-04-22 DIAGNOSIS — E782 Mixed hyperlipidemia: Secondary | ICD-10-CM

## 2024-04-23 ENCOUNTER — Telehealth: Payer: Self-pay | Admitting: Cardiovascular Disease

## 2024-04-23 MED ORDER — AMLODIPINE BESYLATE 2.5 MG PO TABS: 2.5000 mg | ORAL_TABLET | Freq: Every day | ORAL | 2 refills | Status: AC

## 2024-04-23 NOTE — Telephone Encounter (Signed)
RXs sent in 

## 2024-04-23 NOTE — Telephone Encounter (Signed)
*  STAT* If patient is at the pharmacy, call can be transferred to refill team.   1. Which medications need to be refilled? (please list name of each medication and dose if known)   rosuvastatin  (CRESTOR ) 20 MG tablet  TAKE 1 TABLET BY MOUTH AT BEDTIME  amLODipine  (NORVASC ) 2.5 MG tablet (Expired)  Take 1 tablet (2.5 mg total) by mouth daily.  2. Would you like to learn more about the convenience, safety, & potential cost savings by using the [facility]? No   3. Are you open to using the [facility] No   4. Which pharmacy/location (including street and city if local pharmacy) is medication to be sent to?[facility] [address], [place] - [address]    5. Do they need a 30 day or 90 day supply? 90 Day Supply  Pt is currently out of medication

## 2024-05-12 ENCOUNTER — Ambulatory Visit (INDEPENDENT_AMBULATORY_CARE_PROVIDER_SITE_OTHER): Admitting: Dermatology

## 2024-05-12 DIAGNOSIS — L409 Psoriasis, unspecified: Secondary | ICD-10-CM

## 2024-05-12 DIAGNOSIS — L682 Localized hypertrichosis: Secondary | ICD-10-CM

## 2024-05-12 DIAGNOSIS — L731 Pseudofolliculitis barbae: Secondary | ICD-10-CM

## 2024-05-12 DIAGNOSIS — Z7189 Other specified counseling: Secondary | ICD-10-CM

## 2024-05-12 DIAGNOSIS — Z79899 Other long term (current) drug therapy: Secondary | ICD-10-CM | POA: Diagnosis not present

## 2024-05-12 MED ORDER — CLINDAMYCIN PHOSPHATE 1 % EX LOTN: TOPICAL_LOTION | CUTANEOUS | 3 refills | Status: AC

## 2024-05-12 MED ORDER — GUSELKUMAB 100 MG/ML ~~LOC~~ SOAJ
100.0000 mg | Freq: Once | SUBCUTANEOUS | Status: AC
Start: 2024-05-12 — End: 2024-05-12
  Administered 2024-05-12: 100 mg via SUBCUTANEOUS

## 2024-05-12 MED ORDER — ZORYVE 0.3 % EX CREA: 1.0000 | TOPICAL_CREAM | Freq: Every day | CUTANEOUS | 5 refills | Status: AC

## 2024-05-12 NOTE — Progress Notes (Signed)
 Follow-Up Visit   Subjective  Morgan Mcclain is a 73 y.o. female who presents for the following: Psoriasis 64m f/u, inframammary, abdomen Tremfya  100mg /ml sq injections q 8 wks, Ketoconazole  2% cr prn She has new breakouts on her left arm x 1 month, very itchy, tried ketoconazole  cream, but made really dry. Patient was sick in August with Covid, but arm was already broken out.   She also has breakouts on the chin from shaving. Getting bumps.  Hairs are white.  The following portions of the chart were reviewed this encounter and updated as appropriate: medications, allergies, medical history  Review of Systems:  No other skin or systemic complaints except as noted in HPI or Assessment and Plan.  Objective  Well appearing patient in no apparent distress; mood and affect are within normal limits.  Areas Examined: Inframammary, abdomen, arms  Relevant exam findings are noted in the Assessment and Plan.      Assessment & Plan  PSORIASIS   Related Medications hydrocortisone  2.5 % cream Apply to affected areas 1-2 times daily as needed for itch. TREMFYA  100 MG/ML prefilled syringe INJECT 1 SYRINGE UNDER THE SKIN EVERY 8 WEEKS ketoconazole  (NIZORAL ) 2 % cream Apply 1-2 times daily to body folds prn flares. guselkumab  (TREMFYA ) pen 100 mg   PSORIASIS on systemic treatment Hyperpigmented scaly plaques and papules on the left forearm; hyperpigmented patch at the right inframammary.  4% BSA.  Chronic and persistent condition with duration or expected duration over one year. Condition is symptomatic/ bothersome to patient. Not currently at goal.    Counseling and coordination of care for severe psoriasis on systemic treatment  Psoriasis - severe on systemic treatment.  Psoriasis is a chronic non-curable, but treatable genetic/hereditary disease that may have other systemic features affecting other organ systems such as joints (Psoriatic Arthritis).  It is linked with heart disease,  inflammatory bowel disease, non-alcoholic fatty liver disease, and depression. Significant skin psoriasis and/or psoriatic arthritis may have significant symptoms and affects activities of daily activity and often benefits from systemic treatments.  These systemic treatments have some potential side effects including immunosuppression and require pre-treatment laboratory screening and periodic laboratory monitoring and periodic in person evaluation and monitoring by the attending dermatologist physician (long term medication management).   Patient with joint pain, improved on Tremfya . Labs done 11/04/23, TB done 12/23/23  Treatment Plan: Start Zoryve  once a day to affected areas psoriasis, ok to use under breasts dsp 60 g 5Rf.  Continue Tremfya  100 MG/mL q 8 wks. Tremfya  100 mg/mL injected into the right lower abdomen today. Patient tolerated well. Sample used today. Patient's will be delivered tomorrow.  Lot [id number] Exp 04/2025 NDC 42105-359-00   Reviewed risks of biologics including immunosuppression, infections (i.e. TB reactivation), injection site reaction, and failure to improve condition. Goal is control of skin condition, not cure.  Some older biologics such as Humira and Enbrel may slightly increase risk of malignancy and may worsen congestive heart failure.  Taltz, Cosentyx, and Bimzelx may cause inflammatory bowel disease to flare or may increase incidence of yeast infections. Skyrizi, Tremfya , and Stelara may also slightly increase risk of infection. The use of biologics requires long term medication management, including periodic office visits, annual TB screening test and monitoring of blood work.   Long term medication management.  Patient is using long term (months to years) prescription medication  to control their dermatologic condition.  These medications require periodic monitoring to evaluate for efficacy and side effects and may  require periodic laboratory monitoring.     HYPERTRICHOSIS with PFB Exam: white hairs at chin with few follicular based papules  Chronic and persistent condition with duration or expected duration over one year. Condition is bothersome/symptomatic for patient. Currently flared.   Treatment Plan:  Avoid plucking hairs. Avoid close shave. To decrease ingrown hairs Start Clindamycin  lotion to chin daily. Sample of CeraVe 4% BP cleanser given, may use with shaving. Can bleach fabric Discussed oral Spironolactone for antiandrogen effect, patient will discuss with PCP since she is taking other BP medication. Discussed Vaniqa cream to slow down hair growth, non-covered through [facility] compound pharmacy [facility] would not be effective due to white hairs, she could consider electrolysis though, both cosmetic preocedures.   Return in about 6 months (around 11/09/2024) for Psoriasis with Dr Deitra. Every 8 wks with nurse. Morgan Mcclain Morgan Mcclain, CMA, am acting as scribe for Rexene Rattler, MD .   Documentation: I have reviewed the above documentation for accuracy and completeness, and I agree with the above.  Rexene Rattler, MD

## 2024-05-12 NOTE — Patient Instructions (Addendum)
 Spironolactone can cause increased urination and cause blood pressure to decrease. Please watch for signs of lightheadedness and be cautious when changing position. It can sometimes cause breast tenderness or an irregular period in premenopausal women. It can also increase potassium. The increase in potassium usually is not a concern unless you are taking other medicines that also increase potassium, so please be sure your doctor knows all of the other medications you are taking. This medication should not be taken by pregnant women.  This medicine should also not be taken together with sulfa drugs like Bactrim (trimethoprim/sulfamethexazole).   Start Zoryve  Cream - apply once daily to affected areas psoriasis on arms and under breasts.   Recommend OTC benzoyl peroxide cleanser, wash affected areas daily in shower, let sit several minutes prior to rinsing.  May bleach towels if not rinsed off completely.  Recommended brands include Panoxyl 4% Creamy Wash, CeraVe Acne Foaming Cream wash, or Cetaphil Gentle Clear Complexion-Clearing BPO Acne Cleanser.   Due to recent changes in healthcare laws, you may see results of your pathology and/or laboratory studies on MyChart before the doctors have had a chance to review them. We understand that in some cases there may be results that are confusing or concerning to you. Please understand that not all results are received at the same time and often the doctors may need to interpret multiple results in order to provide you with the best plan of care or course of treatment. Therefore, we ask that you please give us  2 business days to thoroughly review all your results before contacting the office for clarification. Should we see a critical lab result, you will be contacted sooner.   If You Need Anything After Your Visit  If you have any questions or concerns for your doctor, please call our main line at [phone] and press option 4 to reach your doctor's medical  assistant. If no one answers, please leave a voicemail as directed and we will return your call as soon as possible. Messages left after 4 pm will be answered the following business day.   You may also send us  a message via MyChart. We typically respond to MyChart messages within 1-2 business days.  For prescription refills, please ask your pharmacy to contact our office. Our fax number is [phone].  If you have an urgent issue when the clinic is closed that cannot wait until the next business day, you can page your doctor at the number below.    Please note that while we do our best to be available for urgent issues outside of office hours, we are not available 24/7.   If you have an urgent issue and are unable to reach us , you may choose to seek medical care at your doctor's office, retail clinic, urgent care center, or emergency room.  If you have a medical emergency, please immediately call 911 or go to the emergency department.  Pager Numbers  - Dr. Hester: [phone]  - Dr. Jackquline: [phone]  - Dr. Claudene: [phone]   - Dr. Raymund: [phone]  In the event of inclement weather, please call our main line at [phone] for an update on the status of any delays or closures.  Dermatology Medication Tips: Please keep the boxes that topical medications come in in order to help keep track of the instructions about where and how to use these. Pharmacies typically print the medication instructions only on the boxes and not directly on the medication tubes.   If your medication  is too expensive, please contact our office at [phone] option 4 or send us  a message through MyChart.   We are unable to tell what your co-pay for medications will be in advance as this is different depending on your insurance coverage. However, we may be able to find a substitute medication at lower cost or fill out paperwork to get insurance to cover a needed medication.   If a prior  authorization is required to get your medication covered by your insurance company, please allow us  1-2 business days to complete this process.  Drug prices often vary depending on where the prescription is filled and some pharmacies may offer cheaper prices.  The website www.goodrx.com contains coupons for medications through different pharmacies. The prices here do not account for what the cost may be with help from insurance (it may be cheaper with your insurance), but the website can give you the price if you did not use any insurance.  - You can print the associated coupon and take it with your prescription to the pharmacy.  - You may also stop by our office during regular business hours and pick up a GoodRx coupon card.  - If you need your prescription sent electronically to a different pharmacy, notify our office through [facility] or by phone at [phone] option 4.     Si Usted Necesita Algo Despus de Su Visita  Tambin puede enviarnos un mensaje a travs de Clinical cytogeneticist. Por lo general respondemos a los mensajes de MyChart en el transcurso de 1 a 2 das hbiles.  Para renovar recetas, por favor pida a su farmacia que se ponga en contacto con nuestra oficina. Randi lakes de fax es [place] [phone].  Si tiene un asunto urgente cuando la clnica est cerrada y que no puede esperar hasta el siguiente da hbil, puede llamar/localizar a su doctor(a) al nmero que aparece a continuacin.   Por favor, tenga en cuenta que aunque hacemos todo lo posible para estar disponibles para asuntos urgentes fuera del horario de [place], no estamos disponibles las 24 horas del da, los 7 [address]  [address] de la [place].   Si tiene un problema urgente y no puede comunicarse con nosotros, puede optar por buscar atencin mdica  en el consultorio de su doctor(a), en una clnica privada, en un centro de atencin urgente o en una sala de emergencias.  Si tiene Engineer, drilling, por favor llame  inmediatamente al 911 o vaya a la sala de emergencias.  Nmeros de bper  - Dr. Hester: [phone]  - Dra. Jackquline: [phone]  - Dr. Claudene: [phone]  - Dra. Kitts: [phone]  En caso de inclemencias del [place], por favor llame a nuestra lnea principal al [phone] para una actualizacin sobre el estado de cualquier retraso o cierre.  Consejos para la medicacin en dermatologa: Por favor, guarde las cajas en las que vienen los medicamentos de uso tpico para ayudarle a seguir las instrucciones sobre dnde y cmo usarlos. Las farmacias generalmente imprimen las instrucciones del medicamento slo en las cajas y no directamente en los tubos del Portal.   Si su medicamento es muy caro, por favor, pngase en contacto con landry rieger llamando al [phone] y presione la opcin 4 o envenos un mensaje a travs de Clinical cytogeneticist.   No podemos decirle cul ser su copago por los medicamentos por adelantado ya que esto es diferente dependiendo de la cobertura de su seguro. Sin embargo, es posible que podamos encontrar un medicamento sustituto a Engineering geologist o  llenar un formulario para que el seguro cubra el medicamento que se considera necesario.   Si se requiere una autorizacin previa para que su compaa de seguros malta su medicamento, por favor permtanos de 1 a 2 das hbiles para completar este proceso.  Los precios de los medicamentos varan con frecuencia dependiendo del Environmental consultant de dnde se surte la receta y alguna farmacias pueden ofrecer precios ms baratos.  El sitio web www.goodrx.com tiene cupones para medicamentos de Health and safety inspector. Los precios aqu no tienen en cuenta lo que podra costar con la ayuda del seguro (puede ser ms barato con su seguro), pero el sitio web puede darle el precio si no utiliz Tourist information centre manager.  - Puede imprimir el cupn correspondiente y llevarlo con su receta a la farmacia.  - Tambin puede pasar por nuestra oficina durante el horario  de atencin regular y Education officer, museum una tarjeta de cupones de GoodRx.  - Si necesita que su receta se enve electrnicamente a una farmacia diferente, informe a nuestra oficina a travs de MyChart de [place] o por telfono llamando al [phone] y presione la opcin 4.

## 2024-07-07 ENCOUNTER — Ambulatory Visit (INDEPENDENT_AMBULATORY_CARE_PROVIDER_SITE_OTHER)

## 2024-07-07 DIAGNOSIS — L400 Psoriasis vulgaris: Secondary | ICD-10-CM

## 2024-07-07 DIAGNOSIS — L409 Psoriasis, unspecified: Secondary | ICD-10-CM

## 2024-07-07 MED ORDER — GUSELKUMAB 100 MG/ML ~~LOC~~ SOSY
100.0000 mg | PREFILLED_SYRINGE | Freq: Once | SUBCUTANEOUS | Status: AC
Start: 2024-07-07 — End: 2024-07-07
  Administered 2024-07-07: 100 mg via SUBCUTANEOUS

## 2024-07-07 NOTE — Progress Notes (Signed)
 Patient here today for 8 week Tremfya  injection for Psoriasis Vulgaris.    Tremfya  100mg  injected into left lower abdomen. Patient tolerated well.   LOT: [id number] EXP: 06/2025 NDC: 42105-359-98   Alan Pizza, RMA

## 2024-08-03 ENCOUNTER — Encounter: Payer: Self-pay | Admitting: Family Medicine

## 2024-08-03 ENCOUNTER — Ambulatory Visit (INDEPENDENT_AMBULATORY_CARE_PROVIDER_SITE_OTHER): Admitting: Family Medicine

## 2024-08-03 VITALS — BP 134/72 | HR 70 | Ht 67.0 in | Wt 240.1 lb

## 2024-08-03 DIAGNOSIS — L4050 Arthropathic psoriasis, unspecified: Secondary | ICD-10-CM

## 2024-08-03 DIAGNOSIS — G5691 Unspecified mononeuropathy of right upper limb: Secondary | ICD-10-CM | POA: Diagnosis not present

## 2024-08-03 MED ORDER — BACLOFEN 10 MG PO TABS: 5.0000 mg | ORAL_TABLET | Freq: Three times a day (TID) | ORAL | 0 refills | Status: AC | PRN

## 2024-08-03 NOTE — Progress Notes (Unsigned)
 Subjective:    Patient ID: Morgan Mcclain, female    DOB: 09/15/1950, 73 y.o.   MRN: [id number]  Morgan Mcclain is a 73 y.o. female presenting on 08/03/2024 for Hand Pain (Right hand)   HPI  Right Hand Pain Episodic painful episodes with R hand with whole hand cramping pain tightening with some fingertip numbness, episodic only lasting few hours max then resolves. Otherwise, has 0 pain and symptoms. She is Right hand dominant. - No known injury -  ***   ***  Health Maintenance: ***     11/08/2023    9:43 AM 11/08/2023    9:07 AM 11/02/2022    9:26 AM  Depression screen PHQ 2/9  Decreased Interest 1 0 0  Down, Depressed, Hopeless 0 0 0  PHQ - 2 Score 1 0 0  Altered sleeping 1 0 0  Tired, decreased energy 2 2 0  Change in appetite 1 0 0  Feeling bad or failure about yourself  0 0 0  Trouble concentrating 0 0 0  Moving slowly or fidgety/restless 0 0 0  Suicidal thoughts 0 0 0  PHQ-9 Score 5  2  0   Difficult doing work/chores Not difficult at all Not difficult at all Not difficult at all     Data saved with a previous flowsheet row definition       11/08/2023    9:43 AM 05/21/2022    1:41 PM 01/25/2021   10:55 AM  GAD 7 : Generalized Anxiety Score  Nervous, Anxious, on Edge 1 0 0  Control/stop worrying 1 1 1   Worry too much - different things 1 1 1   Trouble relaxing 0 0 0  Restless 0 0 0  Easily annoyed or irritable 0 0 0  Afraid - awful might happen 1 0 0  Total GAD 7 Score 4 2 2   Anxiety Difficulty Not difficult at all Not difficult at all Not difficult at all    Social History   Tobacco Use   Smoking status: Never   Smokeless tobacco: Never  Vaping Use   Vaping status: Never Used  Substance Use Topics   Alcohol use: No   Drug use: No    Review of Systems Per HPI unless specifically indicated above     Objective:    BP 134/72 (BP Location: Left Arm, Patient Position: Sitting, Cuff Size: Large)   Pulse 70   Ht 5' 7 (1.702 m)   Wt 240 lb 2  oz (108.9 kg)   SpO2 99%   BMI 37.61 kg/m   Wt Readings from Last 3 Encounters:  08/03/24 240 lb 2 oz (108.9 kg)  12/27/23 239 lb (108.4 kg)  11/08/23 237 lb (107.5 kg)    Physical Exam  Results for orders placed or performed in visit on 11/04/23  QuantiFERON-TB Gold Plus   Collection Time: 12/23/23 10:28 AM  Result Value Ref Range   QuantiFERON Incubation Incubation performed.    QuantiFERON Criteria Comment    QuantiFERON TB1 Ag Value 0.03 IU/mL   QuantiFERON TB2 Ag Value 0.05 IU/mL   QuantiFERON Nil Value 0.03 IU/mL   QuantiFERON Mitogen Value 0.88 IU/mL   QuantiFERON-TB Gold Plus Negative Negative      Assessment & Plan:   Problem List Items Addressed This Visit   None    ***  ***Referral [facility] for NCS (saw Dr Maree 2019)    No orders of the defined types were placed in this encounter.   No  orders of the defined types were placed in this encounter.   Follow up plan: No follow-ups on file.  Future labs ordered for ***  Marsa Officer, DO [facility] 08/03/2024, 11:05 AM

## 2024-08-03 NOTE — Patient Instructions (Addendum)
 Thank you for coming to the office today.  Likely your symptoms are caused by nerve impingement or nerve pinching that causes the muscles to spasm and nerve endings to tingle / numb. Likely int he wrist or elbow or shoulder, some nerve is being pinched, we will do the referral today  Referral to Neurologist for nerve conduction study  [facility] - Neurology Dept [address] [place], [place] [address] Phone: [phone]  IF you have flare up only of the hand / muscle/nerve then try - Start taking Baclofen  (Lioresal ) 10mg  (muscle relaxant) - start with half (cut) to one whole pill at night as needed for next 1-3 nights (may make you drowsy, caution with driving) see how it affects you, then if tolerated increase to one pill 2 to 3 times a day or (every 8 hours as needed)  Please schedule a Follow-up Appointment to: Return if symptoms worsen or fail to improve.  If you have any other questions or concerns, please feel free to call the office or send a message through MyChart. You may also schedule an earlier appointment if necessary.  Additionally, you may be receiving a survey about your experience at our office within a few days to 1 week by e-mail or mail. We value your feedback.  Marsa Officer, DO [facility], [place]

## 2024-08-19 ENCOUNTER — Ambulatory Visit: Attending: Cardiovascular Disease | Admitting: Cardiovascular Disease

## 2024-08-19 ENCOUNTER — Encounter: Payer: Self-pay | Admitting: Cardiovascular Disease

## 2024-08-19 VITALS — BP 140/80 | HR 59 | Ht 66.0 in | Wt 235.5 lb

## 2024-08-19 DIAGNOSIS — I2510 Atherosclerotic heart disease of native coronary artery without angina pectoris: Secondary | ICD-10-CM | POA: Diagnosis not present

## 2024-08-19 DIAGNOSIS — E785 Hyperlipidemia, unspecified: Secondary | ICD-10-CM | POA: Diagnosis not present

## 2024-08-19 DIAGNOSIS — I10 Essential (primary) hypertension: Secondary | ICD-10-CM

## 2024-08-19 NOTE — Progress Notes (Signed)
 "    Cardiology Office Note   Date:  08/19/2024   ID:  Morgan, Mcclain [birth year], MRN [id number]  PCP:  Morgan Marsa PARAS, DO  Cardiologist:   Morgan Cage, MD   Chief Complaint  Patient presents with   6 month follow up     Patient denies chest pain or shortness of breath.       History of Present Illness: Morgan Mcclain is a 73 y.o. female who is here today for a follow-up visit regarding coronary artery disease.   She has known history of prediabetes, elevated blood pressure, obesity and hyperlipidemia.  She is not a smoker.  Her mother had CABG and ultimately died of congestive heart failure. She was seen last year for intermittent episodes of substernal chest pain and tightness. She had about 5 episodes described as heavy feeling when she was trying to hold things.    Her EKG showed anterolateral T wave changes suggestive of ischemia.   Cardiac CTA in 01/2023 showed a calcium  score of 2499 with mild nonobstructive three-vessel coronary artery disease.  There was a small hiatal hernia.  Echocardiogram showed normal LV systolic function with mild mitral regurgitation. She has been doing well with no chest pain, shortness of breath or palpitations.  She does not exercise on a regular basis.   Past Medical History:  Diagnosis Date   Arthritis    Left ureteral calculus    Psoriasis SEVERE--  RECEIVES LASE TX'S TWICE WEEKLY   Tremfya    Renal calculus, left    Sleep apnea 02/23/2015   No longer on CPAP    Past Surgical History:  Procedure Laterality Date   ABDOMINAL HYSTERECTOMY  1994   W/ UNILATERAL SALPINGOOPHORECTOMY   BREAST BIOPSY Right 07/14/2019   Affirm Bx- Coil clip-    FIBROADENOMATOID CHANGE    BREAST BIOPSY Left 07/14/2019   Affirm Bx- X Clip-   FIBROADENOMATOID CHANGE    COLONOSCOPY WITH PROPOFOL  N/A 03/04/2015   Procedure: COLONOSCOPY WITH PROPOFOL ;  Surgeon: Morgan Copping, MD;  Location: [facility];  Service: Endoscopy;  Laterality: N/A;    CYSTOSCOPY WITH RETROGRADE PYELOGRAM, URETEROSCOPY AND STENT PLACEMENT Left 12/25/2012   Procedure: CYSTOSCOPY WITH RETROGRADE PYELOGRAM, URETEROSCOPY AND STENT PLACEMENT;  Surgeon: Morgan Likens, MD;  [facility];  Service: Urology;  Laterality: Left;  90 MIN NEEDS DIG URETEROSCOPE    HOLMIUM LASER APPLICATION Left 12/25/2012   Procedure: HOLMIUM LASER APPLICATION;  Surgeon: Morgan Likens, MD;  Location: [facility];  Service: Urology;  Laterality: Left;   TONSILLECTOMY  AGE 21     Current Outpatient Medications  Medication Sig Dispense Refill   amLODipine  (NORVASC ) 2.5 MG tablet Take 1 tablet (2.5 mg total) by mouth daily. 90 tablet 2   aspirin  81 MG chewable tablet Chew 81 mg by mouth daily.     baclofen  (LIORESAL ) 10 MG tablet Take 0.5-1 tablets (5-10 mg total) by mouth 3 (three) times daily as needed for muscle spasms. 30 each 0   Cholecalciferol (VITAMIN D3) 50 MCG (2000 UT) TABS Take 2,000 Units by mouth daily.     clindamycin  (CLEOCIN -T) 1 % lotion Apply to affected areas chin once daily as needed. 60 mL 3   hydrocortisone  2.5 % cream Apply to affected areas 1-2 times daily as needed for itch. 60 g 2   hydroquinone  4 % cream Apply to dark areas on skin at qd-bid 28.35 g 2   ketoconazole  (NIZORAL ) 2 % cream Apply  1-2 times daily to body folds prn flares. 60 g 1   Roflumilast  (ZORYVE ) 0.3 % CREA Apply 1 Application topically daily. qd to aa psoriasis under breast in body folds as needed for flares 60 g 5   rosuvastatin  (CRESTOR ) 20 MG tablet TAKE 1 TABLET BY MOUTH AT BEDTIME 90 tablet 2   TREMFYA  100 MG/ML prefilled syringe INJECT 1 SYRINGE UNDER THE SKIN EVERY 8 WEEKS 2 mL 1   vitamin B-12 (CYANOCOBALAMIN ) 1000 MCG tablet Take 1,000 mcg by mouth daily.     CONTRAVE  8-90 MG TB12 Week 1: Take 1 tab daily with breakfast. Week 2: Take 1 tab twice daily with meal. Week 3: Take 2 tab with AM meal and 1 tab with PM meal. Week 4+: Take 2 tabs twice daily  with meals - continue for weight loss (Patient not taking: Reported on 08/19/2024)     No current facility-administered medications for this visit.    Allergies:   Lamisil [terbinafine] and Shellfish allergy    Social History:  The patient  reports that she has never smoked. She has never used smokeless tobacco. She reports that she does not drink alcohol and does not use drugs.   Family History:  The patient's family history includes Arthritis in her mother; Cancer in her father; Heart attack (age of onset: 53) in her mother; Heart disease in her mother; Heart failure in her mother.    ROS:  Please see the history of present illness.   Otherwise, review of systems are positive for none.   All other systems are reviewed and negative.    PHYSICAL EXAM: VS:  BP (!) 140/80 (BP Location: Left Arm, Patient Position: Sitting, Cuff Size: Large)   Pulse (!) 59   Ht 5' 6 (1.676 m)   Wt 235 lb 8 oz (106.8 kg)   SpO2 96%   BMI 38.01 kg/m  , BMI Body mass index is 38.01 kg/m. GEN: Well nourished, well developed, in no acute distress  HEENT: normal  Neck: no JVD, carotid bruits, or masses Cardiac: RRR; no murmurs, rubs, or gallops, mild bilateral leg edema Respiratory:  clear to auscultation bilaterally, normal work of breathing GI: soft, nontender, nondistended, + BS MS: no deformity or atrophy  Skin: warm and dry, no rash Neuro:  Strength and sensation are intact Psych: euthymic mood, full affect Dorsalis pedis and posterior tibial are both palpable bilaterally.   EKG:  EKG is ordered today. The ekg ordered today demonstrates : Sinus bradycardia Possible Left atrial enlargement Nonspecific T wave abnormality When compared with ECG of 27-Dec-2023 08:39, Premature ventricular complexes are no longer Present Inverted T waves have replaced nonspecific T wave abnormality in Lateral leads   Recent Labs: 11/04/2023: ALT 7; BUN 10; Creat 0.70; Hemoglobin 13.8; Platelets 173; Potassium  4.2; Sodium 139; TSH 1.31    Lipid Panel    Component Value Date/Time   CHOL 138 11/04/2023 0906   CHOL 136 04/11/2023 0954   TRIG 84 11/04/2023 0906   HDL 46 (L) 11/04/2023 0906   HDL 49 04/11/2023 0954   CHOLHDL 3.0 11/04/2023 0906   VLDL 21 06/04/2016 1034   LDLCALC 76 11/04/2023 0906      Wt Readings from Last 3 Encounters:  08/19/24 235 lb 8 oz (106.8 kg)  08/03/24 240 lb 2 oz (108.9 kg)  12/27/23 239 lb (108.4 kg)          01/10/2023   11:51 AM  PAD Screen  Previous PAD dx? Yes  Previous surgical procedure? No  Pain with walking? Yes  Subsides with rest? No  Feet/toe relief with dangling? No  Painful, non-healing ulcers? No  Extremities discolored? No      ASSESSMENT AND PLAN:  1.  Coronary artery disease involving native coronary arteries without angina: Cardiac CTA showed severely elevated calcium  score above 2000 but no evidence of obstructive disease.  No anginal symptoms at the present time.  Continue low-dose aspirin  and treatment of risk factors. I again discussed with her the importance of controlling her risk factors and pursuing a healthier lifestyle.  2.  Essential hypertension: Blood pressure is controlled on amlodipine .  4.  Hyperlipidemia: Continue treatment with rosuvastatin  20 mg once daily.  Most recent lipid profile showed an LDL of 76.  Recommended target LDL of less than 70.  If that does not happen with lifestyle changes, recommend increasing the dose to 40 mg and adding ezetimibe.    Disposition:   FU in 12 months.  Signed,  Morgan Cage, MD  08/19/2024 11:11 AM    [facility] "

## 2024-08-19 NOTE — Patient Instructions (Signed)

## 2024-09-01 ENCOUNTER — Ambulatory Visit

## 2024-09-01 ENCOUNTER — Encounter: Payer: Self-pay | Admitting: Family Medicine

## 2024-09-01 VITALS — BP 122/68 | HR 56 | Ht 66.0 in | Wt 236.2 lb

## 2024-09-01 DIAGNOSIS — L400 Psoriasis vulgaris: Secondary | ICD-10-CM | POA: Diagnosis not present

## 2024-09-01 DIAGNOSIS — R590 Localized enlarged lymph nodes: Secondary | ICD-10-CM | POA: Diagnosis not present

## 2024-09-01 DIAGNOSIS — L409 Psoriasis, unspecified: Secondary | ICD-10-CM

## 2024-09-01 DIAGNOSIS — J0110 Acute frontal sinusitis, unspecified: Secondary | ICD-10-CM

## 2024-09-01 MED ORDER — AMOXICILLIN 500 MG PO CAPS: 500.0000 mg | ORAL_CAPSULE | Freq: Two times a day (BID) | ORAL | 0 refills | Status: AC

## 2024-09-01 MED ORDER — GUSELKUMAB 100 MG/ML ~~LOC~~ SOSY
100.0000 mg | PREFILLED_SYRINGE | Freq: Once | SUBCUTANEOUS | Status: AC
  Administered 2024-09-01: 100 mg via SUBCUTANEOUS

## 2024-09-01 NOTE — Progress Notes (Signed)
 Patient here today for 8 week Tremfya  injection for Psoriasis Vulgaris.    Tremfya  100mg  injected into right lower abdomen. Patient tolerated well.   LOT: [id number] EXP: 09/2025 NDC: 42105-359-98   Alan Pizza, RMA

## 2024-09-01 NOTE — Patient Instructions (Addendum)
 Thank you for coming to the office today.  Likely sinusitis or infection Triggered Lymph Node swelling that caused your pain and symptoms It seems to be better and improving. May take a few more days or weeks to resolve.  If it keeps coming back, or remains swollen for >4 weeks we can do an ultrasound  Ordered Amoxicillin  antibiotic if symptoms return, can take this course.  Please schedule a Follow-up Appointment to: Return if symptoms worsen or fail to improve.  If you have any other questions or concerns, please feel free to call the office or send a message through MyChart. You may also schedule an earlier appointment if necessary.  Additionally, you may be receiving a survey about your experience at our office within a few days to 1 week by e-mail or mail. We value your feedback.  Marsa Officer, DO [facility], [place]

## 2024-09-01 NOTE — Progress Notes (Signed)
 "  Subjective:    Patient ID: Morgan Mcclain, female    DOB: 31-Aug-1950, 74 y.o.   MRN: [id number]  Morgan Mcclain is a 74 y.o. female presenting on 09/01/2024 for Sore Throat (Hurts when swallowing, also knot on neck)  Patient presents for a same day appointment.  HPI  Discussed the use of AI scribe software for clinical note transcription with the patient, who gave verbal consent to proceed.  History of Present Illness   Morgan Mcclain is a 74 year old female who presents with difficulty swallowing and neck pain.  Sinusitis Lymph node swelling neck pain - Pain radiating to ear and neck - Palpable knot in the neck, initially identified as a swollen gland by family member - Symptoms worsened on Sunday when unable to swallow while eating - No sore throat - Occasionally takes ibuprofen for pain relief, but not frequently - Knot has decreased in size but still palpable - Previously experienced similar sensations years ago  Nasal symptoms - Increased nasal drainage attributed to weather - Nose has run more than usual - No nasal congestion reported          11/08/2023    9:43 AM 11/08/2023    9:07 AM 11/02/2022    9:26 AM  Depression screen PHQ 2/9  Decreased Interest 1 0 0  Down, Depressed, Hopeless 0 0 0  PHQ - 2 Score 1 0 0  Altered sleeping 1 0 0  Tired, decreased energy 2 2 0  Change in appetite 1 0 0  Feeling bad or failure about yourself  0 0 0  Trouble concentrating 0 0 0  Moving slowly or fidgety/restless 0 0 0  Suicidal thoughts 0 0 0  PHQ-9 Score 5  2  0   Difficult doing work/chores Not difficult at all Not difficult at all Not difficult at all     Data saved with a previous flowsheet row definition       11/08/2023    9:43 AM 05/21/2022    1:41 PM 01/25/2021   10:55 AM  GAD 7 : Generalized Anxiety Score  Nervous, Anxious, on Edge 1 0 0  Control/stop worrying 1 1 1   Worry too much - different things 1 1 1   Trouble relaxing 0 0 0  Restless 0 0  0  Easily annoyed or irritable 0 0 0  Afraid - awful might happen 1 0 0  Total GAD 7 Score 4 2 2   Anxiety Difficulty Not difficult at all Not difficult at all Not difficult at all    Social History[1]  Review of Systems Per HPI unless specifically indicated above     Objective:    BP 122/68 (BP Location: Right Arm, Patient Position: Sitting, Cuff Size: Large)   Pulse (!) 56   Ht 5' 6 (1.676 m)   Wt 236 lb 4 oz (107.2 kg)   SpO2 99%   BMI 38.13 kg/m   Wt Readings from Last 3 Encounters:  09/01/24 236 lb 4 oz (107.2 kg)  08/19/24 235 lb 8 oz (106.8 kg)  08/03/24 240 lb 2 oz (108.9 kg)    Physical Exam Vitals and nursing note reviewed.  Constitutional:      General: She is not in acute distress.    Appearance: Normal appearance. She is well-developed. She is not diaphoretic.     Comments: Well-appearing, comfortable, cooperative  HENT:     Head: Normocephalic and atraumatic.     Right Ear: Tympanic membrane,  ear canal and external ear normal. There is no impacted cerumen.     Left Ear: Tympanic membrane, ear canal and external ear normal. There is no impacted cerumen.     Nose: Congestion present.     Mouth/Throat:     Mouth: Mucous membranes are moist.     Pharynx: No oropharyngeal exudate or posterior oropharyngeal erythema.  Eyes:     General:        Right eye: No discharge.        Left eye: No discharge.     Conjunctiva/sclera: Conjunctivae normal.  Cardiovascular:     Rate and Rhythm: Normal rate.  Pulmonary:     Effort: Pulmonary effort is normal. No respiratory distress.     Breath sounds: No wheezing.  Musculoskeletal:     Cervical back: Normal range of motion and neck supple. No rigidity or tenderness.  Lymphadenopathy:     Cervical: Cervical adenopathy (left anterior submandibular lymphadenopathy mild swelling minimal tender.) present.  Skin:    General: Skin is warm and dry.     Findings: No erythema or rash.  Neurological:     Mental Status: She is  alert and oriented to person, place, and time.  Psychiatric:        Mood and Affect: Mood normal.        Behavior: Behavior normal.        Thought Content: Thought content normal.     Comments: Well groomed, good eye contact, normal speech and thoughts     Results for orders placed or performed in visit on 11/04/23  QuantiFERON-TB Gold Plus   Collection Time: 12/23/23 10:28 AM  Result Value Ref Range   QuantiFERON Incubation Incubation performed.    QuantiFERON Criteria Comment    QuantiFERON TB1 Ag Value 0.03 IU/mL   QuantiFERON TB2 Ag Value 0.05 IU/mL   QuantiFERON Nil Value 0.03 IU/mL   QuantiFERON Mitogen Value 0.88 IU/mL   QuantiFERON-TB Gold Plus Negative Negative      Assessment & Plan:   Problem List Items Addressed This Visit   None Visit Diagnoses       Left cervical lymphadenopathy    -  Primary     Acute non-recurrent frontal sinusitis       Relevant Medications   amoxicillin  (AMOXIL ) 500 MG capsule       Acute frontal sinusitis with cervical lymphadenopathy Symptoms suggest sinusitis or viral infection. Lymphadenopathy improving, indicating resolution. L ear and jaw pain seems to be radiation of pain from L side neck now seems resolving Afebrile, no focal sign of infection on exam throat and ear  Reassurance self limited, continue supportive care Back up plan only if not improving or return of symptoms - Prescribed 10-day amoxicillin  if symptoms worsen or return. - Advised ibuprofen for pain and swelling. - Instructed to monitor lymphadenopathy; consider ultrasound if persists beyond four weeks.      No orders of the defined types were placed in this encounter.   Meds ordered this encounter  Medications   amoxicillin  (AMOXIL ) 500 MG capsule    Sig: Take 1 capsule (500 mg total) by mouth 2 (two) times daily. For 10 days    Dispense:  20 capsule    Refill:  0    Follow up plan: Return if symptoms worsen or fail to improve.   Marsa Officer, DO [facility] 09/01/2024, 11:52 AM     [1]  Social History Tobacco Use  Smoking status: Never   Smokeless tobacco: Never  Vaping Use   Vaping status: Never Used  Substance Use Topics   Alcohol use: No   Drug use: No   "

## 2024-09-23 ENCOUNTER — Other Ambulatory Visit: Payer: Self-pay | Admitting: Dermatology

## 2024-09-23 DIAGNOSIS — L409 Psoriasis, unspecified: Secondary | ICD-10-CM

## 2024-09-25 NOTE — Progress Notes (Signed)
 Morgan Mcclain                                          MRN: [id number]   09/25/2024   The [facility] Quality Team Specialist reviewed this patient medical record for the purposes of chart review for care gap closure. The following were reviewed: chart review for care gap closure-controlling blood pressure.    [facility] Quality Team

## 2024-11-10 ENCOUNTER — Ambulatory Visit: Admitting: Dermatology

## 2024-11-11 ENCOUNTER — Other Ambulatory Visit

## 2024-11-13 ENCOUNTER — Ambulatory Visit

## 2024-11-18 ENCOUNTER — Ambulatory Visit

## 2024-11-18 ENCOUNTER — Encounter: Admitting: Family Medicine
